# Patient Record
Sex: Male | Born: 1960 | Race: Black or African American | Hispanic: No | Marital: Married | Smoking: Former smoker
Health system: Southern US, Community
[De-identification: ages and names within clinical notes are randomized; demographics above are authoritative.]

## PROBLEM LIST (undated history)

## (undated) DIAGNOSIS — Z8679 Personal history of other diseases of the circulatory system: Secondary | ICD-10-CM

## (undated) DIAGNOSIS — Z9289 Personal history of other medical treatment: Secondary | ICD-10-CM

## (undated) DIAGNOSIS — I5022 Chronic systolic (congestive) heart failure: Secondary | ICD-10-CM

## (undated) DIAGNOSIS — I2510 Atherosclerotic heart disease of native coronary artery without angina pectoris: Secondary | ICD-10-CM

## (undated) DIAGNOSIS — I509 Heart failure, unspecified: Secondary | ICD-10-CM

## (undated) DIAGNOSIS — E785 Hyperlipidemia, unspecified: Secondary | ICD-10-CM

## (undated) DIAGNOSIS — I428 Other cardiomyopathies: Secondary | ICD-10-CM

## (undated) HISTORY — DX: Hyperlipidemia, unspecified: E78.5

## (undated) HISTORY — DX: Chronic systolic (congestive) heart failure: I50.22

## (undated) HISTORY — DX: Atherosclerotic heart disease of native coronary artery without angina pectoris: I25.10

## (undated) HISTORY — DX: Personal history of other medical treatment: Z92.89

## (undated) HISTORY — DX: Personal history of other diseases of the circulatory system: Z86.79

## (undated) HISTORY — DX: Other cardiomyopathies: I42.8

## (undated) HISTORY — PX: CARDIAC CATHETERIZATION: SHX172

---

## 1999-08-14 ENCOUNTER — Emergency Department (HOSPITAL_COMMUNITY): Admission: EM | Admit: 1999-08-14 | Discharge: 1999-08-14 | Payer: Self-pay | Admitting: Emergency Medicine

## 1999-08-14 ENCOUNTER — Encounter: Payer: Self-pay | Admitting: Emergency Medicine

## 2006-10-11 ENCOUNTER — Ambulatory Visit: Payer: Self-pay | Admitting: Internal Medicine

## 2007-02-07 ENCOUNTER — Ambulatory Visit: Payer: Self-pay | Admitting: Internal Medicine

## 2009-11-16 ENCOUNTER — Observation Stay (HOSPITAL_COMMUNITY)
Admission: EM | Admit: 2009-11-16 | Discharge: 2009-11-19 | Payer: Self-pay | Source: Home / Self Care | Admitting: Emergency Medicine

## 2009-11-16 ENCOUNTER — Ambulatory Visit: Payer: Self-pay | Admitting: Cardiology

## 2009-11-17 ENCOUNTER — Encounter (INDEPENDENT_AMBULATORY_CARE_PROVIDER_SITE_OTHER): Payer: Self-pay | Admitting: Internal Medicine

## 2009-11-17 ENCOUNTER — Encounter: Payer: Self-pay | Admitting: Infectious Diseases

## 2009-11-26 ENCOUNTER — Ambulatory Visit: Payer: Self-pay | Admitting: Cardiology

## 2009-11-26 DIAGNOSIS — F17200 Nicotine dependence, unspecified, uncomplicated: Secondary | ICD-10-CM | POA: Insufficient documentation

## 2009-11-26 DIAGNOSIS — I5022 Chronic systolic (congestive) heart failure: Secondary | ICD-10-CM

## 2009-11-26 DIAGNOSIS — I2510 Atherosclerotic heart disease of native coronary artery without angina pectoris: Secondary | ICD-10-CM

## 2009-12-03 ENCOUNTER — Encounter (INDEPENDENT_AMBULATORY_CARE_PROVIDER_SITE_OTHER): Payer: Self-pay | Admitting: *Deleted

## 2009-12-08 ENCOUNTER — Telehealth: Payer: Self-pay | Admitting: Cardiology

## 2009-12-16 ENCOUNTER — Ambulatory Visit: Payer: Self-pay | Admitting: Cardiology

## 2009-12-16 ENCOUNTER — Ambulatory Visit (HOSPITAL_COMMUNITY): Admission: RE | Admit: 2009-12-16 | Discharge: 2009-12-16 | Payer: Self-pay | Admitting: Cardiology

## 2009-12-31 ENCOUNTER — Ambulatory Visit: Payer: Self-pay | Admitting: Cardiology

## 2010-01-02 LAB — CONVERTED CEMR LAB
BUN: 15 mg/dL (ref 6–23)
CO2: 29 meq/L (ref 19–32)
Calcium: 9.6 mg/dL (ref 8.4–10.5)
Chloride: 100 meq/L (ref 96–112)
Creatinine, Ser: 0.9 mg/dL (ref 0.4–1.5)
GFR calc non Af Amer: 115.5 mL/min (ref >60)
Glucose, Bld: 93 mg/dL (ref 70–99)
Potassium: 4.2 meq/L (ref 3.5–5.1)
Pro B Natriuretic peptide (BNP): 212 pg/mL — ABNORMAL HIGH (ref 0.0–100.0)
Sodium: 137 meq/L (ref 135–145)

## 2010-01-05 ENCOUNTER — Ambulatory Visit: Payer: Self-pay | Admitting: Cardiology

## 2010-01-14 LAB — CONVERTED CEMR LAB
BUN: 14 mg/dL (ref 6–23)
CO2: 27 meq/L (ref 19–32)
Calcium: 9.7 mg/dL (ref 8.4–10.5)
Chloride: 104 meq/L (ref 96–112)
Creatinine, Ser: 1 mg/dL (ref 0.4–1.5)
GFR calc non Af Amer: 102.27 mL/min (ref >60)
Glucose, Bld: 103 mg/dL — ABNORMAL HIGH (ref 70–99)
Potassium: 4.3 meq/L (ref 3.5–5.1)
Sodium: 139 meq/L (ref 135–145)

## 2010-03-16 ENCOUNTER — Ambulatory Visit: Payer: Self-pay | Admitting: Cardiology

## 2010-03-17 LAB — CONVERTED CEMR LAB
BUN: 21 mg/dL (ref 6–23)
CO2: 29 meq/L (ref 19–32)
Calcium: 9.6 mg/dL (ref 8.4–10.5)
Chloride: 100 meq/L (ref 96–112)
Creatinine, Ser: 0.9 mg/dL (ref 0.4–1.5)
GFR calc non Af Amer: 111.11 mL/min (ref >60)
Glucose, Bld: 94 mg/dL (ref 70–99)
Potassium: 4.1 meq/L (ref 3.5–5.1)
Pro B Natriuretic peptide (BNP): 81.1 pg/mL (ref 0.0–100.0)
Sodium: 137 meq/L (ref 135–145)

## 2010-05-14 ENCOUNTER — Telehealth (INDEPENDENT_AMBULATORY_CARE_PROVIDER_SITE_OTHER): Payer: Self-pay | Admitting: *Deleted

## 2010-05-14 ENCOUNTER — Telehealth: Payer: Self-pay | Admitting: Cardiology

## 2010-05-14 ENCOUNTER — Encounter: Payer: Self-pay | Admitting: Cardiology

## 2010-05-28 ENCOUNTER — Ambulatory Visit: Payer: Self-pay | Admitting: Internal Medicine

## 2010-05-28 ENCOUNTER — Ambulatory Visit (HOSPITAL_COMMUNITY): Admission: RE | Admit: 2010-05-28 | Discharge: 2010-05-28 | Payer: Self-pay | Admitting: Cardiology

## 2010-05-28 ENCOUNTER — Encounter: Payer: Self-pay | Admitting: Cardiology

## 2010-05-28 ENCOUNTER — Ambulatory Visit: Payer: Self-pay

## 2010-06-08 ENCOUNTER — Ambulatory Visit: Payer: Self-pay | Admitting: Cardiology

## 2010-06-22 ENCOUNTER — Ambulatory Visit: Payer: Self-pay | Admitting: Cardiology

## 2010-06-26 ENCOUNTER — Telehealth: Payer: Self-pay | Admitting: Cardiology

## 2010-06-29 LAB — CONVERTED CEMR LAB
Albumin: 3.9 g/dL (ref 3.5–5.2)
Alkaline Phosphatase: 56 units/L (ref 39–117)
CO2: 29 meq/L (ref 19–32)
Chloride: 104 meq/L (ref 96–112)
Creatinine, Ser: 0.8 mg/dL (ref 0.4–1.5)
Glucose, Bld: 86 mg/dL (ref 70–99)
HDL: 67.9 mg/dL (ref >39.00)
LDL Cholesterol: 98 mg/dL (ref 0–99)
Sodium: 139 meq/L (ref 135–145)
Total Bilirubin: 0.7 mg/dL (ref 0.3–1.2)
Total CHOL/HDL Ratio: 3
Triglycerides: 115 mg/dL (ref 0.0–149.0)

## 2010-07-20 ENCOUNTER — Telehealth: Payer: Self-pay | Admitting: Cardiology

## 2010-08-26 ENCOUNTER — Ambulatory Visit: Payer: Self-pay

## 2010-09-27 ENCOUNTER — Encounter: Payer: Self-pay | Admitting: Cardiology

## 2010-09-30 ENCOUNTER — Other Ambulatory Visit: Payer: Self-pay | Admitting: Cardiology

## 2010-09-30 ENCOUNTER — Encounter: Payer: Self-pay | Admitting: Cardiology

## 2010-09-30 ENCOUNTER — Ambulatory Visit
Admission: RE | Admit: 2010-09-30 | Discharge: 2010-09-30 | Payer: Self-pay | Source: Home / Self Care | Attending: Cardiology | Admitting: Cardiology

## 2010-10-01 LAB — BASIC METABOLIC PANEL
BUN: 12 mg/dL (ref 6–23)
CO2: 27 mEq/L (ref 19–32)
Calcium: 9 mg/dL (ref 8.4–10.5)
Chloride: 101 mEq/L (ref 96–112)
Creatinine, Ser: 0.9 mg/dL (ref 0.4–1.5)
GFR: 122.99 mL/min (ref >60.00)
Glucose, Bld: 91 mg/dL (ref 70–99)
Potassium: 3.8 mEq/L (ref 3.5–5.1)
Sodium: 137 mEq/L (ref 135–145)

## 2010-10-01 LAB — BRAIN NATRIURETIC PEPTIDE: Pro B Natriuretic peptide (BNP): 69.9 pg/mL (ref 0.0–100.0)

## 2010-10-06 NOTE — Progress Notes (Signed)
   Walk in Patient Form Recieved " Pt needs med called in" sent to [facility]  May 14, 2010 2:47 PM

## 2010-10-06 NOTE — Assessment & Plan Note (Signed)
Summary: per check out/sf  Medications Added COREG 6.25 MG TABS (CARVEDILOL) one and one-half  tablets  two times a day for 1 week then two tablets twice a day CHANTIX STARTING MONTH PAK 0.5 MG X 11 & 1 MG X 42 TABS (VARENICLINE TARTRATE) take as directed-pt not using CHANTIX CONTINUING MONTH PAK 1 MG TABS (VARENICLINE TARTRATE) take as directed-pt not using      Allergies Added:   Primary Provider:  Dr. Wynelle Link  CC:  follow up.  Follow up on MR and Labs. Pt states he has  been feeling good.  Marland Kitchen  History of Present Illness: 50 yo with minimal past history presents returns for evaluation of newly-diagnosed cardiomyopathy.  Patient presented to [facility] in 3/11 after 2-3 weeks of shortness of breath.  He had a flu-like illness with congestion and myalgias and had been treated with clarithromycin as an outpatient.  However, he became progressively short of breath so went into the hospital.  He was noted to have BNP 976 and pulmonary edema on CXR.  Echo showed EF 20-25%.  Left heart cath was done showing mild coronary disease, suggesting that this was likely a nonischemic cardiomyopathy.  Patient was diuresed and begun on cardiac meds.  Cardiac MRI showed EF 20% without significant delayed enhancement, so no evidence for infiltrative disease.    Since discharge he has been doing well.  No more dyspnea.  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  He does a lot of walking on the job and is not having any problems.  He is able to push mow his lawn without difficulty.  He can walk up a hill without problem.  Weight has been stable.  No chest pain, no orthopnea/PND, no lightheadedness.  He does not know his family history (adopted).  He uses ETOH moderately heavily (3-4 drinks 3 times a week).  He does not use drugs.  HIV was negative.    ECG: NSR, PVCs, LVH with repolarization abnormality  Labs (3/11): UPEP/SPEP negative, ANA negative, urine drug screen negative, HIV negative, TSH normal, LDL  158, HDL 106, TGs 200, BNP 976, ESR 3, K 4, creatinine 1.01 Labs (4/11): BNP 212, K 4.2, creatinine 0.9  Current Medications (verified): 1)  Aspirin 81 Mg Tbec (Aspirin) .... Take One Tablet By Mouth Daily 2)  Lipitor 20 Mg Tabs (Atorvastatin Calcium) .... Take One Tablet Once Daily 3)  Coreg 6.25 Mg Tabs (Carvedilol) .... One  Tablet Two Times A Day 4)  Enalapril Maleate 5 Mg Tabs (Enalapril Maleate) .... One and One-Half  Tablet Twice A Day 5)  Klor-Con M20 20 Meq Cr-Tabs (Potassium Chloride Crys Cr) .... Take One Tablet Once Daily 6)  Spironolactone 25 Mg Tabs (Spironolactone) .... One Tablet Daily 7)  Furosemide 20 Mg Tabs (Furosemide) .... Take One Tablet Daily 8)  Chantix Starting Month Pak 0.5 Mg X 11 & 1 Mg X 42 Tabs (Varenicline Tartrate) .... Take As Directed-Pt Not Using 9)  Chantix Continuing Month Pak 1 Mg Tabs (Varenicline Tartrate) .... Take As Directed-Pt Not Using  Allergies (verified): 1)  ! Pcn  Past History:  Past Medical History: 1. Nonischemic Cardiomyopathy: LHC (3/11) showed EF 15%, 40% ostial CFX, 40% prox RCA, 40-50% mid RCA.  Echo (3/11): Moderately dilated LV, EF 20-25%, global HK, severe diastolic dysfunction, mild RV dilation and dysfunction, moderate MR. SPEP/UPEP negative, ANA negative, HIV negative, TSH normal, no drug history.  Moderate ETOH use.  Highest suspicion probably viral myocarditis.  Cardiac MRI (4/11) showed a moderately dilated LV with global systolic dysfunction, EF 20%, normal RV size with moderate systolic dysfunction, small area of nonspecific subepicardial basal inferoseptal (RV insertion site) delayed enhancement.  2. Nonobstructive CAD: LHC as above with mild disease.  3. Smoking 4. Hyperlipidemia  Family History: Reviewed history from 11/26/2009 and no changes required. The patient is adopted so does not know his family history.   Social History: Reviewed history from 11/26/2009 and no changes required. 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  smoked half pack per day for over 30 years, now down to 4-5 cigs/day Alcohol Use - yes, usually liquor three times a week (3-4 drinks at a time) Married , has two children.  One is 14 and one is 20.      Review of Systems       All systems reviewed and negative except as per HPI.   Vital Signs:  Patient profile:   50 year old male Height:      68 inches Weight:      147 pounds BMI:     22.43 Pulse rate:   78 / minute Pulse rhythm:   regular BP sitting:   122 / 84  (left arm) Cuff size:   regular  Vitals Entered By: Judithe Modest CMA (Jan 05, 2010 9:19 AM)  Physical Exam  General:  Well developed, well nourished, in no acute distress. Neck:  Neck supple, no JVD. No masses, thyromegaly or abnormal cervical nodes. Lungs:  Clear bilaterally to auscultation and percussion. Heart:  Non-displaced PMI, chest non-tender; regular rate and rhythm, S1, S2 without murmurs, rubs or gallops. Carotid upstroke normal, no bruit.  Pedals normal pulses. No edema, no varicosities. Abdomen:  Bowel sounds positive; abdomen soft and non-tender without masses, organomegaly, or hernias noted. No hepatosplenomegaly. Extremities:  No clubbing or cyanosis. Neurologic:  Alert and oriented x 3. Psych:  Normal affect.   Impression & Recommendations:  Problem # 1:  CHRONIC SYSTOLIC HEART FAILURE (ICD-428.22) Nonischemic cardiomyopathy of uncertain etiology.  Probably due to viral myocarditis versus possibly ETOH.  Cannot rule out familial cardiomyopathy as patient was adopted and does not know family history.  Cardiac MRI did not show significant delayed enhancement, so no evidence for infiltrative disease.  Patient appears euvolemic on exam and has minimal exertional symptoms.  Weight is stable.  Increase Coreg gradually to 12.5 mg two times a day.  Continue current dose of enalapril and spironolactone.  After enalapril and spironolactone are titrated, will start Bidil.   Echo in 9/11 to followup LV function on therapy.  If still < 35%, will need ICD.  BMET today as enalapril recently increased.   Problem # 2:  HYPERLIPIDEMIA-MIXED (ICD-272.4) Continue statin for goal LDL < 70 given coronary disease.  Lipids/LFTs at next appointment in 2 months.   Problem # 3:  CAD (ICD-414.00) Mild coronary disease on cath (maximal 40-50% stenosis in RCA).  Continue ASA 81 mg daily.  Needs to continue statin for LDL lowering.  Needs to quit smoking.   Problem # 4:  SMOKER (ICD-305.1) Patient is trying to cut back on cigarettes.  I counselled him to quit.  He has a Chantix prescription but has not filled it.   Other Orders: TLB-BMP (Basic Metabolic Panel-BMET) (80048-METABOL) Echocardiogram (Echo)  Patient Instructions: 1)  Your physician has recommended you make the following change in your medication:  2)  Increase Coreg to 9.375mg (this will be one and one-half  of a 6.25mg  tablet ) twice a day for one week then increase to 12.5mg (this will be two 6.25mg  tablets)  twice a day 3)  Your physician recommends that you have  lab work today--BMP 428.22 4)  Your physician recommends that you schedule a follow-up appointment in: 2 months with Dr Shirlee Latch. 5)  Your physician has requested that you have an echocardiogram.  Echocardiography is a painless test that uses sound waves to create images of your heart. It provides your doctor with information about the size and shape of your heart and how well your heart's chambers and valves are working.  This procedure takes approximately one hour. There are no restrictions for this procedure. SEPTEMBER 2011 428.22

## 2010-10-06 NOTE — Assessment & Plan Note (Signed)
Summary: per check out/sf    Visit Type:  Follow-up Primary Provider:  Dr. Wynelle Link   History of Present Illness: 50 yo with minimal past history presents returns for evaluation of recently-diagnosed cardiomyopathy.  Patient presented to [facility] in 3/11 after 2-3 weeks of shortness of breath.  He had a flu-like illness with congestion and myalgias and had been treated with clarithromycin as an outpatient.  However, he became progressively short of breath so went into the hospital.  He was noted to have BNP 976 and pulmonary edema on CXR.  Echo showed EF 20-25%.  Left heart cath was done showing mild coronary disease, suggesting that this was likely a nonischemic cardiomyopathy.  Patient was diuresed and begun on cardiac meds.  Cardiac MRI showed EF 20% without significant delayed enhancement, so no evidence for infiltrative disease.    Since discharge he has been doing well.  No more dyspnea.  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  He does a lot of walking on the job and is not having any problems.  He is able to push mow his lawn without difficulty.  He can walk up a hill without problem.  Weight has been stable.  No chest pain, no orthopnea/PND, no lightheadedness.  He is now smoking 3-4 cigarettes per day.  He developed headaches after starting Bidil that seem to be lessening gradually over time.    Echo was repeated 9/11, showing some improvement.  Would estimate EF at 40% with diffuse hypokinesis.   Labs (3/11): UPEP/SPEP negative, ANA negative, urine drug screen negative, HIV negative, TSH normal, LDL 158, HDL 106, TGs 200, BNP 976, ESR 3, K 4, creatinine 1.01 Labs (4/11): BNP 212, K 4.2, creatinine 0.97 Labs (7/11): BNP 81, K 4.1, creatinine 0.9  ECG: NSR, LVH with repolarization abnormality.   Current Medications (verified): 1)  Aspirin 81 Mg Tbec (Aspirin) .... Take One Tablet By Mouth Daily 2)  Lipitor 20 Mg Tabs (Atorvastatin Calcium) .... Take One Tablet Once Daily 3)   Coreg 12.5 Mg Tabs (Carvedilol) .... One Twice A Day 4)  Enalapril Maleate 10 Mg Tabs (Enalapril Maleate) .... One Twice A Day 5)  Klor-Con M20 20 Meq Cr-Tabs (Potassium Chloride Crys Cr) .... Take One Tablet Once Daily 6)  Spironolactone 25 Mg Tabs (Spironolactone) .... One Tablet Daily 7)  Furosemide 20 Mg Tabs (Furosemide) .... Take One Tablet Daily 8)  Bidil 20-37.5 Mg Tabs (Isosorb Dinitrate-Hydralazine) .... One Three Times A Day  Allergies: 1)  ! Pcn  Past History:  Past Medical History: 1. Nonischemic Cardiomyopathy: LHC (3/11) showed EF 15%, 40% ostial CFX, 40% prox RCA, 40-50% mid RCA.  Echo (3/11): Moderately dilated LV, EF 20-25%, global HK, severe diastolic dysfunction, mild RV dilation and dysfunction, moderate MR. SPEP/UPEP negative, ANA negative, HIV negative, TSH normal, no drug history.  Moderate ETOH use.  Highest suspicion probably viral myocarditis.   Cardiac MRI (4/11) showed a moderately dilated LV with global systolic dysfunction, EF 20%, normal RV size with moderate systolic dysfunction, small area of nonspecific subepicardial basal inferoseptal (RV insertion site) delayed enhancement.  Repeat echo (9/11): EF 40% with mild to moderate diffuse hypokinesis.  2. Nonobstructive CAD: LHC as above with mild disease.  3. Smoking 4. Hyperlipidemia  Family History: Reviewed history from 11/26/2009 and no changes required. The patient is adopted so does not know his family history.   Social History: Reviewed history from 03/16/2010 and no changes required. patient works in Office manager at [employer]  [employer] Tobacco Use - Yes.  smoked half pack per day for over 30 years, now down to 3-4 cigs/day Alcohol Use - yes, usually liquor three times a week (3-4 drinks at a time) Married , has two children.  One is 14 and one is 20.      Review of Systems       All systems reviewed and negative except as per HPI.   Vital Signs:  Patient profile:   50 year old male Height:      68  inches Weight:      150.25 pounds BMI:     22.93 Pulse rate:   94 / minute Pulse rhythm:   regular Resp:     18 per minute BP sitting:   107 / 63  (left arm) Cuff size:   large  Vitals Entered By: Vikki Ports (June 08, 2010 4:06 PM)  Physical Exam  General:  Well developed, well nourished, in no acute distress. Neck:  Neck supple, no JVD. No masses, thyromegaly or abnormal cervical nodes. Lungs:  Clear bilaterally to auscultation and percussion. Heart:  Non-displaced PMI, chest non-tender; regular rate and rhythm, S1, S2 without murmurs, rubs or gallops. Carotid upstroke normal, no bruit.  Pedals normal pulses. No edema, no varicosities. Abdomen:  Bowel sounds positive; abdomen soft and non-tender without masses, organomegaly, or hernias noted. No hepatosplenomegaly. Extremities:  No clubbing or cyanosis. Neurologic:  Alert and oriented x 3. Psych:  Normal affect.   Impression & Recommendations:  Problem # 1:  CHRONIC SYSTOLIC HEART FAILURE (ICD-428.22) Nonischemic cardiomyopathy of uncertain etiology.  Probably due to viral myocarditis versus possibly ETOH.  Cannot rule out familial cardiomyopathy as patient was adopted and does not know family history.  Cardiac MRI did not show significant delayed enhancement, so no evidence for infiltrative disease.  Patient appears euvolemic on exam and has minimal exertional symptoms.  Weight is stable.  Echo shows improvement, EF 40%.  ICD not indicated at this time.  Continue current meds (enalapril, Coreg, spironolactone, and Bidil).  Will not uptitrate at this time as BP has been a bit soft.  OK to discontinue Lasix and KCl.  He appears euvolemic currently.  He will monitor his weight and restart Lasix if he gains more than 3 lbs in a week.  I also encouraged him to increase his exercise level.  Need to check BMP.  Will repeat echo in 6 months.   Problem # 2:  CAD (ICD-414.00) Mild coronary disease on cath (maximal 40-50% stenosis in RCA).   Continue ASA 81 mg daily.  Needs to continue statin for LDL lowering.  Needs to quit smoking completely.  Will repeat lipids/LFTs with goal LDL < 70.   Other Orders: Echocardiogram (Echo)  Patient Instructions: 1)  Stop Lasix(furosemide). 2)  Stop potassium(KCL). 3)  Schedule appointment with your primary care doctor for a pnuemonia and  flu vaccine. 4)  Your physician recommends that you return for a FASTING lipid profile/liver profile/BMP in the next week or two--428.22 5)  Your physician recommends that you schedule a follow-up appointment in: 3 months with Dr Shirlee Latch. 6)  Your physician has requested that you have an echocardiogram.  Echocardiography is a painless test that uses sound waves to create images of your heart. It provides your doctor with information about the size and shape of your heart and how well your heart's chambers and valves are working.  This procedure takes approximately one hour. There are no restrictions for this procedure.  IN 6 MONTHS

## 2010-10-06 NOTE — Progress Notes (Signed)
Summary: pt needs refills  Medications Added ASPIRIN 81 MG TBEC (ASPIRIN) Take one tablet by mouth daily LIPITOR 20 MG TABS (ATORVASTATIN CALCIUM) take one tablet once daily COREG 6.25 MG TABS (CARVEDILOL) one  tablet two times a day ENALAPRIL MALEATE 5 MG TABS (ENALAPRIL MALEATE) one tablet twice a day KLOR-CON M20 20 MEQ CR-TABS (POTASSIUM CHLORIDE CRYS CR) take one tablet once daily SPIRONOLACTONE 25 MG TABS (SPIRONOLACTONE) one tablet daily FUROSEMIDE 20 MG TABS (FUROSEMIDE) take one tablet daily CHANTIX STARTING MONTH PAK 0.5 MG X 11 & 1 MG X 42 TABS (VARENICLINE TARTRATE) take as directed CHANTIX CONTINUING MONTH PAK 1 MG TABS (VARENICLINE TARTRATE) take as directed       Phone Note Refill Request Call back at Home Phone [phone] Message from:  Patient on [facility]  Refills Requested: Medication #1:  LIPITOR 20 MG TABS take one tablet once daily  Medication #2:  COREG 6.25 MG TABS one  tablet two times a day  Medication #3:  ENALAPRIL MALEATE 5 MG TABS one tablet twice a day  Medication #4:  SPIRONOLACTONE 25 MG TABS one tablet daily pt is also completely out of Klor-Con and Furosemide 20mg  and needs a 10day sully [facility] [address] and and call into [facility].  Initial call taken by: Omer Jack,  December 08, 2009 10:58 AM  Follow-up for Phone Call       Follow-up by: Judithe Modest CMA,  December 08, 2009 11:36 AM    Prescriptions: FUROSEMIDE 20 MG TABS (FUROSEMIDE) take one tablet daily  #10 x 0   Entered by:   Judithe Modest CMA   Authorized by:   Marca Ancona, MD   Signed by:   Judithe Modest CMA on 12/08/2009   Method used:   [facility]  [address] [id number]       [address]       [place], [place]  [address]       Ph: [phone] or [phone]       Fax: [phone]   RxID:   [id number] KLOR-CON M20 20 MEQ CR-TABS (POTASSIUM CHLORIDE CRYS CR) take one tablet once daily  #10 x 0   Entered  by:   Judithe Modest CMA   Authorized by:   Marca Ancona, MD   Signed by:   Judithe Modest CMA on 12/08/2009   Method used:   [facility]        [facility]  [address] [id number]       [address]       [place], [place]  [address]       Ph: [phone] or [phone]       Fax: [phone]   RxID:   [id number] FUROSEMIDE 20 MG TABS (FUROSEMIDE) take one tablet daily  #90 x 3   Entered by:   Judithe Modest CMA   Authorized by:   Marca Ancona, MD   Signed by:   Judithe Modest CMA on 12/08/2009   Method used:   Electronically to        [facility]* (mail-order)             ,          Ph: [phone]       Fax: [phone]   RxID:   [id number] SPIRONOLACTONE 25 MG TABS (SPIRONOLACTONE) one tablet  daily  #90 x 3   Entered by:   Judithe Modest CMA   Authorized by:   Marca Ancona, MD   Signed by:   Judithe Modest CMA on 12/08/2009   Method used:   Electronically to        [facility]* (mail-order)             ,          Ph: [phone]       Fax: [phone]   RxID:   [id number] KLOR-CON M20 20 MEQ CR-TABS (POTASSIUM CHLORIDE CRYS CR) take one tablet once daily  #90 x 3   Entered by:   Judithe Modest CMA   Authorized by:   Marca Ancona, MD   Signed by:   Judithe Modest CMA on 12/08/2009   Method used:   Electronically to        [facility]* (mail-order)             ,          Ph: [phone]       Fax: [phone]   RxID:   [id number] ENALAPRIL MALEATE 5 MG TABS (ENALAPRIL MALEATE) one tablet twice a day  #180 x 3   Entered by:   Judithe Modest CMA   Authorized by:   Marca Ancona, MD   Signed by:   Judithe Modest CMA on 12/08/2009   Method used:   Electronically to        [facility]* (mail-order)             ,          Ph: [phone]       Fax: [phone]   RxID:   [id number] COREG 6.25 MG TABS (CARVEDILOL) one  tablet two times a day  #180 x 3   Entered by:   Judithe Modest CMA   Authorized  by:   Marca Ancona, MD   Signed by:   Judithe Modest CMA on 12/08/2009   Method used:   Electronically to        [facility]* (mail-order)             ,          Ph: [phone]       Fax: [phone]   RxID:   [id number] LIPITOR 20 MG TABS (ATORVASTATIN CALCIUM) take one tablet once daily  #90 x 3   Entered by:   Judithe Modest CMA   Authorized by:   Marca Ancona, MD   Signed by:   Judithe Modest CMA on 12/08/2009   Method used:   Electronically to        [facility]* (mail-order)             ,          Ph: [phone]       Fax: [phone]   RxID:   [id number]

## 2010-10-06 NOTE — Assessment & Plan Note (Signed)
Summary: eph/jml  Medications Added ASPIRIN 81 MG TBEC (ASPIRIN) Take one tablet by mouth daily LIPITOR 20 MG TABS (ATORVASTATIN CALCIUM) take one tablet once daily COREG 6.25 MG TABS (CARVEDILOL) one  tablet two times a day ENALAPRIL MALEATE 5 MG TABS (ENALAPRIL MALEATE) one tablet twice a day KLOR-CON M20 20 MEQ CR-TABS (POTASSIUM CHLORIDE CRYS CR) take one tablet once daily SPIRONOLACTONE 25 MG TABS (SPIRONOLACTONE) one tablet daily FUROSEMIDE 20 MG TABS (FUROSEMIDE) take one tablet daily CHANTIX STARTING MONTH PAK 0.5 MG X 11 & 1 MG X 42 TABS (VARENICLINE TARTRATE) take as directed CHANTIX CONTINUING MONTH PAK 1 MG TABS (VARENICLINE TARTRATE) take as directed      Allergies Added:   CC:  eph/  .  History of Present Illness: 50 yo with minimal past history presents for evaluation of newly-diagnosed cardiomyopathy.  Patient presented to [facility] in 3/11 after 2-3 weeks of shortness of breath.  He had a flu-like illness with congestion and myalgias and had been treated with clarithromycin as an outpatient.  However, he became progressively short of breath so went into the hospital.  He was noted to have BNP 976 and pulmonary edema on CXR.  Echo showed EF 20-25%.  Left heart cath was done showing mild coronary disease, suggesting that this was likely a nonischemic cardiomyopathy.  Patient was diuresed and begun on cardiac meds.  Since discharge he has been doing well.  No more dyspnea.  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  He does a lot of walking on the job and is not having any problems.  No chest pain, no orthopnea/PND, no lightheadedness.  He does not know his family history (adopted).  He uses ETOH moderately heavily (3-4 drinks 3 times a week).  He does not use drugs.  HIV was negative.    ECG: NSR, PVCs, LVH with repolarization abnormality  Labs (3/11): UPEP/SPEP negative, ANA negative, urine drug screen negative, HIV negative, TSH normal, LDL 158, HDL 106, TGs 200,  BNP 976, ESR 3, K 4, creatinine 1.01  Current Medications (verified): 1)  Aspirin 325 Mg Tabs (Aspirin) .... Take One Tablet Once Daily 2)  Lipitor 20 Mg Tabs (Atorvastatin Calcium) .... Take One Tablet Once Daily 3)  Coreg 6.25 Mg Tabs (Carvedilol) .... 1/2 Tablet Two Times A Day 4)  Enalapril Maleate 5 Mg Tabs (Enalapril Maleate) .... Take One Tablet Once Daily 5)  Klor-Con M20 20 Meq Cr-Tabs (Potassium Chloride Crys Cr) .... Take One Tablet Once Daily 6)  Spironolactone 25 Mg Tabs (Spironolactone) .... Take 1/2 Tablet Daily 7)  Furosemide 20 Mg Tabs (Furosemide) .... Take One Tablet Daily  Allergies (verified): 1)  ! Pcn  Past History:  Past Medical History: 1. Nonischemic Cardiomyopathy: LHC (3/11) showed EF 15%, 40% ostial CFX, 40% prox RCA, 40-50% mid RCA.  Echo (3/11): Moderately dilated LV, EF 20-25%, global HK, severe diastolic dysfunction, mild RV dilation and dysfunction, moderate MR. SPEP/UPEP negative, ANA negative, HIV negative, TSH normal, no drug history.  Moderate ETOH use.  Highest suspicion probably viral myocarditis.  2. Nonobstructive CAD: LHC as above with mild disease.  3. Smoking 4. Hyperlipidemia  Family History: The patient is adopted so does not know his family history.   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  smoked half pack per day for over 30 years, now down to 4-5 cigs/day Alcohol Use - yes, usually liquor three times a week (3-4 drinks at a  time) Married , his two children.  One is 14 and one is 20.      Review of Systems       All systems reviewed and negative except as per HPI.   Vital Signs:  Patient profile:   50 year old male Height:      68 inches Weight:      147 pounds BMI:     22.43 Pulse rate:   80 / minute Pulse rhythm:   regular BP sitting:   106 / 78  (left arm) Cuff size:   regular  Vitals Entered By: Judithe Modest CMA (November 26, 2009 10:13 AM)  Physical Exam  General:  Well  developed, well nourished, in no acute distress. Head:  normocephalic and atraumatic Nose:  no deformity, discharge, inflammation, or lesions Mouth:  Teeth, gums and palate normal. Oral mucosa normal. Neck:  Neck supple, no JVD. No masses, thyromegaly or abnormal cervical nodes. Lungs:  Clear bilaterally to auscultation and percussion. Heart:  Non-displaced PMI, chest non-tender; regular rate and rhythm, S1, S2 without murmurs, rubs or gallops. Carotid upstroke normal, no bruit.  Pedals normal pulses. No edema, no varicosities. Abdomen:  Bowel sounds positive; abdomen soft and non-tender without masses, organomegaly, or hernias noted. No hepatosplenomegaly. Msk:  Back normal, normal gait. Muscle strength and tone normal. Extremities:  No clubbing or cyanosis. Neurologic:  Alert and oriented x 3. Skin:  Intact without lesions or rashes. Psych:  Normal affect.   Impression & Recommendations:  Problem # 1:  CHRONIC SYSTOLIC HEART FAILURE (ICD-428.22) Nonischemic cardiomyopathy of uncertain etiology.  Probably due to viral myocarditis versus possibly ETOH.  Cannot rule out familial cardiomyopathy as patient was adopted and does not know family history.  Patient appears euvolemic on exam and has minimal exertional symptoms.   I would like to get a cardiac MRI to look for evidence of myocarditis.  I will continue him on enalapril at 5 mg two times a day and lasix 20 mg daily. Increase Coreg to 6.25 mg two times a day.  Increase spironolactone to 25 mg daily.  We talked about cutting back on ETOH intake.  Patient had a number of questions regarding his diagnosis which we discussed today.   Problem # 2:  CAD (ICD-414.00) Mild coronary disease on cath (maximal 40-50% stenosis in RCA).  OK to decrease ASA to 81 mg daily.  Needs to continue statin for LDL lowering.  Needs to quit smoking.   Problem # 3:  SMOKER (ICD-305.1) Patient is trying to cut back on cigarettes.  I counselled him to quit and gave  him a Chantix prescription.   Problem # 4:  HYPERLIPIDEMIA-MIXED (ICD-272.4) Assessment: Deteriorated Continue statin for goal LDL < 70 given coronary disease.  Lipids/LFTs in 2 months.   Other Orders: EKG w/ Interpretation (93000) Cardiac MRI (Cardiac MRI)  Patient Instructions: 1)  Your physician has recommended you make the following change in your medication:  2)  Decrease Aspirin to 81mg  daily--this should be buffered or coated--you can get this without a prescription 3)  Increase Coreg(carvedilol) to 6.25mg  twice a day 4)  Increase Spironolactone to 25mg  daily 5)  Start Chantix to help you stop smoking 6)  Your physician has requested that you have a cardiac MRI.  Cardiac MRI uses a computer to create images of your heart as it's beating, producing both still and moving pictures of your heart and major blood vessels. For further information please visit  [email or web address]  Please follow the  instruction sheet given to you today for more information. 7)  Your physician recommends that you schedule a follow-up appointment in: 1 month with Dr Shirlee Latch 8)  Have lab done a few days before the appointment with Dr Shirlee Latch in 1 month--BMP/BNP 428.22  414.01 Prescriptions: FUROSEMIDE 20 MG TABS (FUROSEMIDE) take one tablet daily  #90 x 3   Entered by:   Katina Dung, RN, BSN   Authorized by:   Marca Ancona, MD   Signed by:   Katina Dung, RN, BSN on 11/26/2009   Method used:   Print then Give to Patient   RxID:   [id number] SPIRONOLACTONE 25 MG TABS (SPIRONOLACTONE) one tablet daily  #90 x 3   Entered by:   Katina Dung, RN, BSN   Authorized by:   Marca Ancona, MD   Signed by:   Katina Dung, RN, BSN on 11/26/2009   Method used:   Print then Give to Patient   RxID:   [id number] KLOR-CON M20 20 MEQ CR-TABS (POTASSIUM CHLORIDE CRYS CR) take one tablet once daily  #90 x 3   Entered by:   Katina Dung, RN, BSN   Authorized by:   Marca Ancona, MD   Signed by:   Katina Dung, RN, BSN on 11/26/2009   Method used:   Print then Give to Patient   RxID:   [id number] ENALAPRIL MALEATE 5 MG TABS (ENALAPRIL MALEATE) one tablet twice a day  #180 x 3   Entered by:   Katina Dung, RN, BSN   Authorized by:   Marca Ancona, MD   Signed by:   Katina Dung, RN, BSN on 11/26/2009   Method used:   Print then Give to Patient   RxID:   [id number] COREG 6.25 MG TABS (CARVEDILOL) one  tablet two times a day  #180 x 3   Entered by:   Katina Dung, RN, BSN   Authorized by:   Marca Ancona, MD   Signed by:   Katina Dung, RN, BSN on 11/26/2009   Method used:   Print then Give to Patient   RxID:   [id number] LIPITOR 20 MG TABS (ATORVASTATIN CALCIUM) take one tablet once daily  #90 x 3   Entered by:   Katina Dung, RN, BSN   Authorized by:   Marca Ancona, MD   Signed by:   Katina Dung, RN, BSN on 11/26/2009   Method used:   Print then Give to Patient   RxID:   [id number] CHANTIX CONTINUING MONTH PAK 1 MG TABS (VARENICLINE TARTRATE) take as directed  #1 x 1   Entered by:   Katina Dung, RN, BSN   Authorized by:   Marca Ancona, MD   Signed by:   Katina Dung, RN, BSN on 11/26/2009   Method used:   Electronically to        [facility]  [address] [id number] (retail)       [address]       [place], [place]  [address]       Ph: [phone] or [phone]       Fax: [phone]   RxID:   [id number] CHANTIX STARTING MONTH PAK 0.5 MG X 11 & 1 MG X 42 TABS (VARENICLINE TARTRATE) take as directed  #1 x 0   Entered by:   Katina Dung, RN, BSN   Authorized by:   Marca Ancona, MD   Signed by:   Katina Dung, RN, BSN on 11/26/2009  Method used:   Electronically to        [facility]  [address] [id number] (ret[facility])       [address]       [place], [place]  [address]       Ph: [phone] or [phone]       Fax: [phone]   RxID:   [id number] COREG 6.25 MG TABS (CARVEDILOL) one  tablet two times a  day  #60 x 6   Entered by:   Katina Dung, RN, BSN   Authorized by:   Marca Ancona, MD   Signed by:   Katina Dung, RN, BSN on 11/26/2009   Method used:   Electronically to        [facility]  [address] [id number] ([facility]       [address]       [place], [place]  [address]       Ph: [phone] or [phone]       Fax: [phone]   RxID:   [id number] SPIRONOLACTONE 25 MG TABS (SPIRONOLACTONE) one tablet daily  #30 x 6   Entered by:   Katina Dung, RN, BSN   Authorized by:   Marca Ancona, MD   Signed by:   Katina Dung, RN, BSN on 11/26/2009   Method used:   [facility]ronically to        [facility]  [address] [id number]       [address]       [place], [place]  [address]       Ph: [phone] or [phone]       Fax: [phone]   RxID:   [id number]

## 2010-10-06 NOTE — Letter (Signed)
Summary: Appointment - Cardiac MRI  [facility]     [place], [place]    Phone:   Fax:       December 03, 2009 MRN: [id number]   [name] [address] [place], [place]  [address]   Dear Mr. MCCLURE,   We have scheduled the above patient for an appointment for a Cardiac MRI on 12-16-2009 at 9:00a.m.  Please refer to the below information for the location and instructions for this test:  Location:     [facility]       [address]       [place], [place]  [address] Instructions:    Wilmon Arms at [facility] Outpatient Registration 45 minutes prior to your appointment time.  This will ensure you are in the Radiology Department 30 minutes prior to your appointment.    There are no restrictions for this test you may eat and take medications as usual.  If you need to reschedule this appointment please call at the number listed above.   Sincerely,      Lorne Skeens [facility] Scheduling Team

## 2010-10-06 NOTE — Progress Notes (Signed)
Summary: pt has questions  Phone Note Call from Patient   Caller: Patient [phone] Reason for Call: Talk to Nurse Summary of Call: pt calling re his needs new rx for lipitor 40 mg to [facility]-pt has cold what can he take otc  Initial call taken by: Glynda Jaeger,  July 20, 2010 10:26 AM  Follow-up for Phone Call        talked with pt-- Rx sent to [facility] for Lipitor --suggested pt try coricidin and saline nasal spray for cold --    New/Updated Medications: LIPITOR 40 MG TABS (ATORVASTATIN CALCIUM) one daily Prescriptions: LIPITOR 40 MG TABS (ATORVASTATIN CALCIUM) one daily  #90 x 3   Entered by:   Katina Dung, RN, BSN   Authorized by:   Marca Ancona, MD   Signed by:   Katina Dung, RN, BSN on 07/20/2010   Method used:   Faxed to ...       [facility] (mail-order)             , [place]         Ph: [phone]       Fax: [phone]   RxID:   [id number]

## 2010-10-06 NOTE — Progress Notes (Signed)
Summary: pt rtn call  Medications Added BIDIL 20-37.5 MG TABS (ISOSORB DINITRATE-HYDRALAZINE) one three times a day       Phone Note Call from Patient Call back at Work Phone [phone]   Caller: Patient Reason for Call: Talk to Nurse, Talk to Doctor Summary of Call: pt on his way back from [place] and will answer if he can other wise leave a message and he will call as soon as he can Initial call taken by: Omer Jack,  May 14, 2010 4:06 PM  Follow-up for Phone Call        131/84  HR 80-pt had B/P and heart rate checked today at his employer--will review with Dr Shirlee Latch to be sure he wants to start BiDil   Katina Dung, RN, BSN  May 15, 2010 1:24 PM --reviewed with Dr Pierce Crane BiDil 20/37.5 three times a day --pt aware    New/Updated Medications: BIDIL 20-37.5 MG TABS (ISOSORB DINITRATE-HYDRALAZINE) one three times a day Prescriptions: BIDIL 20-37.5 MG TABS (ISOSORB DINITRATE-HYDRALAZINE) one three times a day  #270 x 3   Entered by:   Katina Dung, RN, BSN   Authorized by:   Marca Ancona, MD   Signed by:   Katina Dung, RN, BSN on 05/15/2010   Method used:   Faxed to ...       [facility] (mail-order)             , [place]         Ph: [phone]       Fax: [phone]   RxID:   [id number] BIDIL 20-37.5 MG TABS (ISOSORB DINITRATE-HYDRALAZINE) one three times a day  #45 x 11   Entered by:   Katina Dung, RN, BSN   Authorized by:   Marca Ancona, MD   Signed by:   Katina Dung, RN, BSN on 05/15/2010   Method used:   [facility] to        [facility]  [address] [id number]       [address]       [place], [place]  [address]       Ph: [phone] or [phone]       Fax: [phone]   RxID:   [id number]    Current Medications (verified): 1)  Aspirin 81 Mg Tbec (Aspirin) .... Take One Tablet By Mouth Daily 2)  Lipitor 20 Mg Tabs (Atorvastatin Calcium) .... Take One Tablet Once Daily 3)  Coreg 12.5 Mg Tabs  (Carvedilol) .... One Twice A Day 4)  Enalapril Maleate 10 Mg Tabs (Enalapril Maleate) .... One Twice A Day 5)  Klor-Con M20 20 Meq Cr-Tabs (Potassium Chloride Crys Cr) .... Take One Tablet Once Daily 6)  Spironolactone 25 Mg Tabs (Spironolactone) .... One Tablet Daily 7)  Furosemide 20 Mg Tabs (Furosemide) .... Take One Tablet Daily 8)  Bidil 20-37.5 Mg Tabs (Isosorb Dinitrate-Hydralazine) .... One Three Times A Day  Allergies: 1)  ! Pcn

## 2010-10-06 NOTE — Assessment & Plan Note (Signed)
Summary: 2 MONTH ROV.SL  Medications Added COREG 6.25 MG TABS (CARVEDILOL) take one tablet two times a day COREG 12.5 MG TABS (CARVEDILOL) one twice a day ENALAPRIL MALEATE 10 MG TABS (ENALAPRIL MALEATE) one twice a day ENALAPRIL MALEATE 5 MG TABS (ENALAPRIL MALEATE) take 2 tablets two times a day`      Allergies Added:   Primary Provider:  Dr. Wynelle Link  CC:  2 month rov.  Pt still working on quitting smoking.  Pt feeling well.   [name]  History of Present Illness: 50 yo with minimal past history presents returns for evaluation of newly-diagnosed cardiomyopathy.  Patient presented to [facility] in 3/11 after 2-3 weeks of shortness of breath.  He had a flu-like illness with congestion and myalgias and had been treated with clarithromycin as an outpatient.  However, he became progressively short of breath so went into the hospital.  He was noted to have BNP 976 and pulmonary edema on CXR.  Echo showed EF 20-25%.  Left heart cath was done showing mild coronary disease, suggesting that this was likely a nonischemic cardiomyopathy.  Patient was diuresed and begun on cardiac meds.  Cardiac MRI showed EF 20% without significant delayed enhancement, so no evidence for infiltrative disease.    Since discharge he has been doing well.  No more dyspnea.  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  He does a lot of walking on the job and is not having any problems.  He is able to push mow his lawn without difficulty.  He can walk up a hill without problem.  Weight has been stable.  No chest pain, no orthopnea/PND, no lightheadedness.  He does not know his family history (adopted).  He uses ETOH moderately heavily (3-4 drinks 3 times a week).  He does not use drugs.  HIV was negative.  He is working on cutting back on smoking.   Labs (3/11): UPEP/SPEP negative, ANA negative, urine drug screen negative, HIV negative, TSH normal, LDL 158, HDL 106, TGs 200, BNP 976, ESR 3, K 4, creatinine 1.01 Labs (4/11): BNP  212, K 4.2, creatinine 0.9 Labs (6/11): BNP 81, K 4.1, creatinine 0.9  Current Medications (verified): 1)  Aspirin 81 Mg Tbec (Aspirin) .... Take One Tablet By Mouth Daily 2)  Lipitor 20 Mg Tabs (Atorvastatin Calcium) .... Take One Tablet Once Daily 3)  Coreg 6.25 Mg Tabs (Carvedilol) .... Take One Tablet Two Times A Day 4)  Enalapril Maleate 5 Mg Tabs (Enalapril Maleate) .... Take 2 Tablets Two Times A Day` 5)  Klor-Con M20 20 Meq Cr-Tabs (Potassium Chloride Crys Cr) .... Take One Tablet Once Daily 6)  Spironolactone 25 Mg Tabs (Spironolactone) .... One Tablet Daily 7)  Furosemide 20 Mg Tabs (Furosemide) .... Take One Tablet Daily  Allergies (verified): 1)  ! Pcn  Past History:  Past Medical History: Last updated: 01/05/2010 1. Nonischemic Cardiomyopathy: LHC (3/11) showed EF 15%, 40% ostial CFX, 40% prox RCA, 40-50% mid RCA.  Echo (3/11): Moderately dilated LV, EF 20-25%, global HK, severe diastolic dysfunction, mild RV dilation and dysfunction, moderate MR. SPEP/UPEP negative, ANA negative, HIV negative, TSH normal, no drug history.  Moderate ETOH use.  Highest suspicion probably viral myocarditis.   Cardiac MRI (4/11) showed a moderately dilated LV with global systolic dysfunction, EF 20%, normal RV size with moderate systolic dysfunction, small area of nonspecific subepicardial basal inferoseptal (RV insertion site) delayed enhancement.  2. Nonobstructive CAD: LHC as above with mild disease.  3.  Smoking 4. Hyperlipidemia  Family History: Reviewed history from 11/26/2009 and no changes required. The patient is adopted so does not know his family history.   Social History: Reviewed history from 01/05/2010 and no changes required. 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  smoked half pack per day for over 30 years, now down to 3-4 cigs/day Alcohol Use - yes, usually liquor three times a week (3-4 drinks at a time) Married , has two children.  One is 14 and  one is 20.      Review of Systems       All systems reviewed and negative except as per HPI.   Vital Signs:  Patient profile:   50 year old male Height:      68 inches Weight:      146 pounds BMI:     22.28 Pulse rate:   86 / minute Pulse rhythm:   regular BP sitting:   116 / 70  (left arm) Cuff size:   regular  Vitals Entered By: Judithe Modest CMA (March 16, 2010 9:05 AM)  Physical Exam  General:  Well developed, well nourished, in no acute distress. Neck:  Neck supple, no JVD. No masses, thyromegaly or abnormal cervical nodes. Lungs:  Clear bilaterally to auscultation and percussion. Heart:  Non-displaced PMI, chest non-tender; regular rate and rhythm, S1, S2 without murmurs, rubs or gallops. Carotid upstroke normal, no bruit.  Pedals normal pulses. No edema, no varicosities. Abdomen:  Bowel sounds positive; abdomen soft and non-tender without masses, organomegaly, or hernias noted. No hepatosplenomegaly. Extremities:  No clubbing or cyanosis. Neurologic:  Alert and oriented x 3. Psych:  Normal affect.   Impression & Recommendations:  Problem # 1:  CHRONIC SYSTOLIC HEART FAILURE (ICD-428.22) Nonischemic cardiomyopathy of uncertain etiology.  Probably due to viral myocarditis versus possibly ETOH.  Cannot rule out familial cardiomyopathy as patient was adopted and does not know family history.  Cardiac MRI did not show significant delayed enhancement, so no evidence for infiltrative disease.  Patient appears euvolemic on exam and has minimal exertional symptoms.  Weight is stable.  He increased enalapril to 10 mg bid rather than Coreg at last appointment, so today I will have him increase Coreg to 12.5 mg bid.  Continue current dose of enalapril and spironolactone.  BP check in 2 weeks and if not hypotensive, will start Bidil 1 tab tid.   Echo in 9/11 to followup LV function on therapy.  If still < 35%, will need ICD.    Problem # 2:  SMOKER (ICD-305.1) Patient working on  quitting on his own.  He is down to < 1/2 ppd.   Other Orders: Echocardiogram (Echo) TLB-BNP (B-Natriuretic Peptide) (83880-BNPR) TLB-BMP (Basic Metabolic Panel-BMET) (80048-METABOL)  Patient Instructions: 1)  Your physician has recommended you make the following change in your medication:  2)  Increase Coreg(carvedilol) to 12.5mg  twice a day.--you can take two 6.25mg  tablets twice a day. 3)  Increae Enalapril to 10mg  twice a day. 4)  Lab today --BMP/BNP 428.22 5)  Nurse Visit middle of August to check your blood pressure and pulse. If it is OK Dr Shirlee Latch will start you on  medication called BiDil. 6)  Your physician has requested that you have an echocardiogram.  Echocardiography is a painless test that uses sound waves to create images of your heart. It provides your doctor with information about the size and shape of your heart and how well your heart's chambers and valves are  working.  This procedure takes approximately one hour. There are no restrictions for this procedure. SEPTEMBER 2011 7)  Your physician recommends that you schedule a follow-up appointment in: September with Dr Shirlee Latch a few days after you have the echo done. Prescriptions: ENALAPRIL MALEATE 10 MG TABS (ENALAPRIL MALEATE) one twice a day  #180 x 3   Entered by:   Katina Dung, RN, BSN   Authorized by:   Marca Ancona, MD   Signed by:   Katina Dung, RN, BSN on 03/16/2010   Method used:   Faxed to ...       [facility] (mail-order)             , [place]         Ph: [phone]       Fax: [phone]   RxID:   [id number] FUROSEMIDE 20 MG TABS (FUROSEMIDE) take one tablet daily  #90 x 3   Entered by:   Katina Dung, RN, BSN   Authorized by:   Marca Ancona, MD   Signed by:   Katina Dung, RN, BSN on 03/16/2010   Method used:   Faxed to ...       [facility] (mail-order)             , [place]         Ph: [phone]       Fax: [phone]   RxID:   [id number] SPIRONOLACTONE 25 MG TABS (SPIRONOLACTONE) one tablet  daily  #90 x 3   Entered by:   Katina Dung, RN, BSN   Authorized by:   Marca Ancona, MD   Signed by:   Katina Dung, RN, BSN on 03/16/2010   Method used:   Faxed to ...       [facility] (mail-order)             , [place]         Ph: [phone]       Fax: [phone]   RxID:   [id number] KLOR-CON M20 20 MEQ CR-TABS (POTASSIUM CHLORIDE CRYS CR) take one tablet once daily  #90 x 3   Entered by:   Katina Dung, RN, BSN   Authorized by:   Marca Ancona, MD   Signed by:   Katina Dung, RN, BSN on 03/16/2010   Method used:   Faxed to ...       [facility] (mail-order)             , [place]         Ph: [phone]       Fax: [phone]   RxID:   [id number] COREG 12.5 MG TABS (CARVEDILOL) one twice a day  #180 x 3   Entered by:   Katina Dung, RN, BSN   Authorized by:   Marca Ancona, MD   Signed by:   Katina Dung, RN, BSN on 03/16/2010   Method used:   Faxed to ...       [facility] (mail-order)             , [place]         Ph: [phone]       Fax: [phone]   RxID:   [id number] LIPITOR 20 MG TABS (ATORVASTATIN CALCIUM) take one tablet once daily  #90 x 3   Entered by:   Katina Dung, RN, BSN   Authorized by:   Marca Ancona, MD   Signed by:   Katina Dung, RN, BSN on 03/16/2010   Method used:  Faxed to ...       [facility] (mail-order)             , [place]         Ph: [phone]       Fax: [phone]   RxID:   [id number] ENALAPRIL MALEATE 10 MG TABS (ENALAPRIL MALEATE) one twice a day  #10 x 11   Entered by:   Katina Dung, RN, BSN   Authorized by:   Marca Ancona, MD   Signed by:   Katina Dung, RN, BSN on 03/16/2010   Method used:   [facility]ronically to        [facility]  [address] [id number]       [address]       [place], [place]  [address]       Ph: [phone] or [phone]       Fax: [phone]   RxID:   [id number]

## 2010-10-06 NOTE — Progress Notes (Signed)
Summary: test results  Phone Note Call from Patient Call back at Home Phone [phone] Call back at Work Phone [phone]   Caller: Patient Reason for Call: Lab or Test Results Initial call taken by: Judie Grieve,  June 26, 2010 3:42 PM  Follow-up for Phone Call        Pt aware of cholesterol results. Mylo Red RN

## 2010-10-06 NOTE — Letter (Signed)
Summary: [facility] Walk-In Patient Form  [facility] Walk-In Patient Form   Imported By: Marylou Mccoy 06/11/2010 13:38:01  _____________________________________________________________________  External Attachment:    Type:   Image     Comment:   External Document

## 2010-10-08 NOTE — Assessment & Plan Note (Signed)
Summary: PER CHECK OUT/SF   Visit Type:  Follow-up Primary Provider:  Dr. Wynelle Link  CC:  pt has no complaints today.  History of Present Illness: 50 yo with minimal past history presents returns for evaluation of nonischemic cardiomyopathy.  Patient presented to [facility] in 3/11 after 2-3 weeks of shortness of breath.  He had a flu-like illness with congestion and myalgias and had been treated with clarithromycin as an outpatient.  However, he became progressively short of breath so went into the hospital.  He was noted to have BNP 976 and pulmonary edema on CXR.  Echo showed EF 20-25%.  Left heart cath was done showing mild coronary disease, suggesting that this was likely a nonischemic cardiomyopathy.  Patient was diuresed and begun on cardiac meds.  Cardiac MRI showed EF 20% without significant delayed enhancement, so no evidence for infiltrative disease.    Since discharge he has been doing well.  No more dyspnea.  He works as a Electrical engineer at [employer]  He does a lot of walking on the job and is not having any problems.  He can walk up a hill without problem.  Weight has been stable.  No chest pain, no orthopnea/PND, no lightheadedness.  He is now smoking 2-3 cigarettes per day.  Bidil-related headaches have resolved.    Echo was repeated 9/11, showing some improvement.  Would estimate EF at 40% with diffuse hypokinesis.   Labs (3/11): UPEP/SPEP negative, ANA negative, urine drug screen negative, HIV negative, TSH normal, LDL 158, HDL 106, TGs 200, BNP 976, ESR 3, K 4, creatinine 1.01 Labs (4/11): BNP 212, K 4.2, creatinine 0.97 Labs (7/11): BNP 81, K 4.1, creatinine 0.9 Labs (10/11): K 4.2, creatinine 0.8, LDL 98, HDL 68  ECG: NSR, LVH  Current Medications (verified): 1)  Aspirin 81 Mg Tbec (Aspirin) .... Take One Tablet By Mouth Daily 2)  Lipitor 40 Mg Tabs (Atorvastatin Calcium) .... One Daily 3)  Coreg 12.5 Mg Tabs (Carvedilol) .... One Twice A Day 4)  Enalapril Maleate 10  Mg Tabs (Enalapril Maleate) .... One Twice A Day 5)  Spironolactone 25 Mg Tabs (Spironolactone) .... One Tablet Daily 6)  Bidil 20-37.5 Mg Tabs (Isosorb Dinitrate-Hydralazine) .... One Three Times A Day  Allergies: 1)  ! Pcn  Past History:  Past Medical History: Reviewed history from 06/08/2010 and no changes required. 1. Nonischemic Cardiomyopathy: LHC (3/11) showed EF 15%, 40% ostial CFX, 40% prox RCA, 40-50% mid RCA.  Echo (3/11): Moderately dilated LV, EF 20-25%, global HK, severe diastolic dysfunction, mild RV dilation and dysfunction, moderate MR. SPEP/UPEP negative, ANA negative, HIV negative, TSH normal, no drug history.  Moderate ETOH use.  Highest suspicion probably viral myocarditis.   Cardiac MRI (4/11) showed a moderately dilated LV with global systolic dysfunction, EF 20%, normal RV size with moderate systolic dysfunction, small area of nonspecific subepicardial basal inferoseptal (RV insertion site) delayed enhancement.  Repeat echo (9/11): EF 40% with mild to moderate diffuse hypokinesis.  2. Nonobstructive CAD: LHC as above with mild disease.  3. Smoking 4. Hyperlipidemia  Family History: Reviewed history from 11/26/2009 and no changes required. The patient is adopted so does not know his family history.   Social History: Reviewed history from 03/16/2010 and no changes required. 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  smoked half pack per day for over 30 years, now down to 3-4 cigs/day Alcohol Use - yes, usually liquor three times a week (3-4 drinks at a  time) Married , has two children.  One is 14 and one is 20.      Vital Signs:  Patient profile:   50 year old male Height:      68 inches Weight:      151 pounds BMI:     23.04 Pulse rate:   72 / minute Resp:     18 per minute BP sitting:   108 / 68  (left arm)  Vitals Entered By: Caralee Ates CMA (September 30, 2010 2:55 PM)  Physical Exam  General:  Well developed, well nourished, in  no acute distress. Neck:  Neck supple, no JVD. No masses, thyromegaly or abnormal cervical nodes. Lungs:  Clear bilaterally to auscultation and percussion. Heart:  Non-displaced PMI, chest non-tender; regular rate and rhythm, S1, S2 without murmurs, rubs or gallops. Carotid upstroke normal, no bruit.  Pedals normal pulses. No edema, no varicosities. Abdomen:  Bowel sounds positive; abdomen soft and non-tender without masses, organomegaly, or hernias noted. No hepatosplenomegaly. Extremities:  No clubbing or cyanosis. Neurologic:  Alert and oriented x 3. Psych:  Normal affect.   Impression & Recommendations:  Problem # 1:  SMOKER (ICD-305.1) Cutting back slowly.  Counselled him to keep trying to quit.   Problem # 2:  CAD (ICD-414.00) Mild coronary disease on cath (maximal 40-50% stenosis in RCA).  Continue ASA 81 mg daily.  Needs to continue statin for LDL lowering.  Needs to quit smoking completely.    Problem # 3:  CHRONIC SYSTOLIC HEART FAILURE (ICD-428.22) Nonischemic cardiomyopathy of uncertain etiology.  Probably due to viral myocarditis versus possibly ETOH.  Cannot rule out familial cardiomyopathy as patient was adopted and does not know family history.  Cardiac MRI did not show significant delayed enhancement, so no evidence for infiltrative disease.  Patient appears euvolemic on exam and has minimal exertional symptoms.  Weight is stable.  Echo in 9/11 showed improvement, EF 40%.  ICD not indicated at this time.  Continue enalapril, spironolactone, and Bidil.  Will increase Coreg to 18.75 mg two times a day.  He appears euvolemic currently.  He will monitor his weight and restart Lasix if he gains more than 3 lbs in a week. BMET/BNP today.  Echo in 9/12 to follow EF and followup appointment in 6 months.   Other Orders: Echocardiogram (Echo) TLB-BMP (Basic Metabolic Panel-BMET) (80048-METABOL) TLB-BNP (B-Natriuretic Peptide) (83880-BNPR)  Patient Instructions: 1)  Your physician  has recommended you make the following change in your medication:  2)  Increase Coreg(carvedilol) to 18.75mg  twice a day--this will be one and one-half 12.5mg  twice a day. 3)  LAb today---BMP/BNP  428.22 4)  Your physician wants you to follow-up in: 6 months with Dr Harmon Dun 2012)  You will receive a reminder letter in the mail two months in advance. If you don't receive a letter, please call our office to schedule the follow-up appointment. 5)  Lab when you see Dr Shirlee Latch in 6 months---BNP/BMP 428.22 6)  Your physician has requested that you have an echocardiogram.  Echocardiography is a painless test that uses sound waves to create images of your heart. It provides your doctor with information about the size and shape of your heart and how well your heart's chambers and valves are working.  This procedure takes approximately one hour. There are no restrictions for this procedure. SEPTEMBER 2012 Prescriptions: COREG 12.5 MG TABS (CARVEDILOL) one and one-half  twice a day  #90 x 6   Entered by:   Katina Dung, RN,  BSN   Authorized by:   Marca Ancona, MD   Signed by:   Marca Ancona, MD on 10/01/2010   Method used:   [facility]  [address] [id number]       [address]       [place], [place]  [address]       Ph: [phone] or [phone]       Fax: [phone]   RxID:   [id number]

## 2010-10-08 NOTE — Letter (Signed)
Summary: [facility] - Walk-In Patient Form   - Walk-In Patient Form   Imported By: Marylou Mccoy 09/14/2010 17:29:01  _____________________________________________________________________  External Attachment:    Type:   Image     Comment:   External Document

## 2010-11-25 LAB — BASIC METABOLIC PANEL
BUN: 20 mg/dL (ref 6–23)
Creatinine, Ser: 1.12 mg/dL (ref 0.4–1.5)
GFR calc Af Amer: >60 mL/min (ref >60)
GFR calc non Af Amer: >60 mL/min (ref >60)

## 2010-11-30 LAB — HEMOGLOBIN A1C
Hgb A1c MFr Bld: 5.6 % (ref 4.6–6.1)
Mean Plasma Glucose: 114 mg/dL

## 2010-11-30 LAB — CBC
HCT: 44.7 % (ref 39.0–52.0)
HCT: 45.7 % (ref 39.0–52.0)
HCT: 48.1 % (ref 39.0–52.0)
Hemoglobin: 13.8 g/dL (ref 13.0–17.0)
Hemoglobin: 15 g/dL (ref 13.0–17.0)
MCHC: 31.2 g/dL (ref 30.0–36.0)
MCV: 89.7 fL (ref 78.0–100.0)
MCV: 89.9 fL (ref 78.0–100.0)
MCV: 90.2 fL (ref 78.0–100.0)
Platelets: 192 10*3/uL (ref 150–400)
Platelets: 209 10*3/uL (ref 150–400)
Platelets: 216 10*3/uL (ref 150–400)
RDW: 15.7 % — ABNORMAL HIGH (ref 11.5–15.5)
RDW: 15.7 % — ABNORMAL HIGH (ref 11.5–15.5)
RDW: 15.9 % — ABNORMAL HIGH (ref 11.5–15.5)
RDW: 16 % — ABNORMAL HIGH (ref 11.5–15.5)
WBC: 6.4 10*3/uL (ref 4.0–10.5)

## 2010-11-30 LAB — DRUGS OF ABUSE SCREEN W/O ALC, ROUTINE URINE
Amphetamine Screen, Ur: NEGATIVE
Cocaine Metabolites: NEGATIVE
Creatinine,U: 171.4 mg/dL
Phencyclidine (PCP): NEGATIVE
Propoxyphene: NEGATIVE

## 2010-11-30 LAB — COMPREHENSIVE METABOLIC PANEL
ALT: 14 U/L (ref 0–53)
AST: 18 U/L (ref 0–37)
AST: 18 U/L (ref 0–37)
Albumin: 3.2 g/dL — ABNORMAL LOW (ref 3.5–5.2)
Albumin: 3.7 g/dL (ref 3.5–5.2)
Alkaline Phosphatase: 71 U/L (ref 39–117)
Alkaline Phosphatase: 72 U/L (ref 39–117)
BUN: 13 mg/dL (ref 6–23)
Calcium: 9.3 mg/dL (ref 8.4–10.5)
Calcium: 9.4 mg/dL (ref 8.4–10.5)
Creatinine, Ser: 0.99 mg/dL (ref 0.4–1.5)
Creatinine, Ser: 1.02 mg/dL (ref 0.4–1.5)
GFR calc Af Amer: >60 mL/min (ref >60)
GFR calc Af Amer: >60 mL/min (ref >60)
GFR calc non Af Amer: >60 mL/min (ref >60)
Glucose, Bld: 102 mg/dL — ABNORMAL HIGH (ref 70–99)
Glucose, Bld: 92 mg/dL (ref 70–99)
Potassium: 3.7 mEq/L (ref 3.5–5.1)
Potassium: 4 mEq/L (ref 3.5–5.1)
Sodium: 139 mEq/L (ref 135–145)
Sodium: 139 mEq/L (ref 135–145)
Total Protein: 6.3 g/dL (ref 6.0–8.3)
Total Protein: 6.7 g/dL (ref 6.0–8.3)
Total Protein: 7.3 g/dL (ref 6.0–8.3)

## 2010-11-30 LAB — UIFE/LIGHT CHAINS/TP QN, 24-HR UR
Alpha 1, Urine: DETECTED — AB
Alpha 2, Urine: DETECTED — AB
Total Protein, Urine: 5.5 mg/dL

## 2010-11-30 LAB — DIFFERENTIAL
Band Neutrophils: 2 % (ref 0–10)
Basophils Absolute: 0.1 10*3/uL (ref 0.0–0.1)
Basophils Relative: 0 % (ref 0–1)
Eosinophils Absolute: 0 10*3/uL (ref 0.0–0.7)
Eosinophils Absolute: 0.1 10*3/uL (ref 0.0–0.7)
Eosinophils Relative: 1 % (ref 0–5)
Monocytes Absolute: 0.5 10*3/uL (ref 0.1–1.0)
Monocytes Absolute: 0.7 10*3/uL (ref 0.1–1.0)
Monocytes Relative: 10 % (ref 3–12)
Neutro Abs: 4 10*3/uL (ref 1.7–7.7)
Neutrophils Relative %: 58 % (ref 43–77)

## 2010-11-30 LAB — PROTEIN ELECTROPHORESIS, SERUM
Albumin ELP: 51.1 % — ABNORMAL LOW (ref 55.8–66.1)
Alpha-1-Globulin: 11 % — ABNORMAL HIGH (ref 2.9–4.9)
Beta 2: 6.1 % (ref 3.2–6.5)
Beta Globulin: 6.6 % (ref 4.7–7.2)

## 2010-11-30 LAB — LIPASE, BLOOD: Lipase: 29 U/L (ref 11–59)

## 2010-11-30 LAB — BASIC METABOLIC PANEL
BUN: 15 mg/dL (ref 6–23)
CO2: 26 mEq/L (ref 19–32)
Chloride: 102 mEq/L (ref 96–112)
Glucose, Bld: 126 mg/dL — ABNORMAL HIGH (ref 70–99)
Potassium: 3.9 mEq/L (ref 3.5–5.1)

## 2010-11-30 LAB — HIV ANTIBODY (ROUTINE TESTING W REFLEX): HIV: NONREACTIVE

## 2010-11-30 LAB — CK TOTAL AND CKMB (NOT AT ARMC)
CK, MB: 1.3 ng/mL (ref 0.3–4.0)
Relative Index: 1.3 (ref 0.0–2.5)
Total CK: 102 U/L (ref 7–232)

## 2010-11-30 LAB — APTT: aPTT: 26 seconds (ref 24–37)

## 2010-11-30 LAB — TROPONIN I: Troponin I: 0.02 ng/mL (ref 0.00–0.06)

## 2010-11-30 LAB — LIPID PANEL
LDL Cholesterol: 158 mg/dL — ABNORMAL HIGH (ref 0–99)
Total CHOL/HDL Ratio: 2.9 RATIO
VLDL: 40 mg/dL (ref 0–40)

## 2010-11-30 LAB — CARDIAC PANEL(CRET KIN+CKTOT+MB+TROPI)
Relative Index: 1.2 (ref 0.0–2.5)
Troponin I: 0.03 ng/mL (ref 0.00–0.06)
Troponin I: 0.04 ng/mL (ref 0.00–0.06)

## 2010-11-30 LAB — PROTIME-INR: INR: 0.87 (ref 0.00–1.49)

## 2010-11-30 LAB — BRAIN NATRIURETIC PEPTIDE: Pro B Natriuretic peptide (BNP): 560 pg/mL — ABNORMAL HIGH (ref 0.0–100.0)

## 2011-01-22 NOTE — Assessment & Plan Note (Signed)
[facility]                         GASTROENTEROLOGY OFFICE NOTE   [name], [name]                    MRN:          [id number]  DATE:10/11/2006                            DOB:          1960-12-15    REASON FOR CONSULTATION:  Bleeding.   ASSESSMENT:  A 50 year old [place] American male with intermittent blood  in his stool/rectal bleeding.  There is some bloating.  There is some  occasional diarrhea, but really no significant bowel habit changes.   RECOMMENDATIONS AND PLAN:  Schedule colonoscopy.  Risks, benefits and  indications were reviewed.  Possibilities include anorectal bleeding,  but certainly colorectal neoplasia should be excluded.   HISTORY:  This man has noted intermittent blood with his stools on the  toilet paper off and on, usually bright red.  Dr. Duaine Dredge recommended  he come see me to get evaluated.  He had a normal hemoglobin back in  August 2007.   GI review of systems is otherwise negative at this time.  See medical  history form for full details.  He has felt bloated, I should note.  That is new.   MEDICATIONS:  None.   PAST MEDICAL HISTORY:  Negative as far as surgeries.  He did have a  triglyceride level of 635 in August 2007.  He is due to follow up.   FAMILY HISTORY:  No colon cancer.  Our review is otherwise negative.   SOCIAL HISTORY:  He is married, 2 daughters.  He is a Investment banker, operational at [employer].  He smokes 1/2 pack per day, and is  counseled to quit.  Alcohol 3 times a week.   REVIEW OF SYSTEMS:  No night sweats, dyspnea, insomnia.  All other  systems negative.   PHYSICAL EXAMINATION:  Well-developed, well-nourished black male.  Height 5 feet 8 inches.  Weight 164 pounds.  Blood pressure 130/90.  Pulse 85.  Eyes anicteric.  ENT normal.  NECK:  Supple is supple.  No thyromegaly or mass.  CHEST:  Clear.  HEART:  S1 and S2.  I hear no rubs, murmurs or gallops.  There may be an  intermittent S4, however, at times.  ABDOMEN:  Soft and non-tender.  No organomegaly or mass.  RECTAL EXAM:  Deferred.  LYMPHATIC:  No supraclavicular nodes.  EXTREMITIES:  No peripheral edema.  SKIN:  No rash.  PSYCHIATRIC:  He is alert and oriented x3.   I appreciate the opportunity to care for this patient.  I have reviewed  Dr. Geoffery Lyons office notes and labs that he has sent to our office.     Iva Boop, MD,FACG  Electronically Signed    [facility]  DD: 10/11/2006  DT: 10/11/2006  Job #: [id number]   cc:   Mosetta Putt, M.D.

## 2011-03-01 ENCOUNTER — Telehealth: Payer: Self-pay | Admitting: Cardiology

## 2011-03-01 MED ORDER — SPIRONOLACTONE 25 MG PO TABS: 25.0000 mg | ORAL_TABLET | Freq: Every day | ORAL | Status: DC

## 2011-03-01 NOTE — Telephone Encounter (Signed)
Spironolactone  [facility] mg. [facility] [address]

## 2011-03-02 ENCOUNTER — Telehealth: Payer: Self-pay | Admitting: Cardiology

## 2011-03-02 ENCOUNTER — Other Ambulatory Visit: Payer: Self-pay | Admitting: *Deleted

## 2011-03-02 MED ORDER — SPIRONOLACTONE 25 MG PO TABS: 25.0000 mg | ORAL_TABLET | Freq: Every day | ORAL | Status: DC

## 2011-03-02 NOTE — Telephone Encounter (Signed)
2 months supply refill given [facility] and [facility] days suplly sent to [facility]. Vikki Ports

## 2011-03-02 NOTE — Telephone Encounter (Signed)
Pt needs refill of spironolactone 25 mg mail order [facility] and will need a 2 week supply @ [facility] [address] to last until mail order comes in-pt said he  Left this message yesterday but I don't see any documentation

## 2011-04-01 ENCOUNTER — Encounter: Payer: Self-pay | Admitting: Cardiology

## 2011-04-06 ENCOUNTER — Encounter: Payer: Self-pay | Admitting: Cardiology

## 2011-04-06 ENCOUNTER — Ambulatory Visit (INDEPENDENT_AMBULATORY_CARE_PROVIDER_SITE_OTHER): Payer: BC Managed Care – PPO | Admitting: Cardiology

## 2011-04-06 DIAGNOSIS — F17200 Nicotine dependence, unspecified, uncomplicated: Secondary | ICD-10-CM

## 2011-04-06 DIAGNOSIS — E785 Hyperlipidemia, unspecified: Secondary | ICD-10-CM

## 2011-04-06 DIAGNOSIS — E7800 Pure hypercholesterolemia, unspecified: Secondary | ICD-10-CM

## 2011-04-06 DIAGNOSIS — I5022 Chronic systolic (congestive) heart failure: Secondary | ICD-10-CM

## 2011-04-06 MED ORDER — CARVEDILOL 25 MG PO TABS: 25.0000 mg | ORAL_TABLET | Freq: Two times a day (BID) | ORAL | Status: DC

## 2011-04-06 NOTE — Patient Instructions (Signed)
Increase Coreg(carvedilol) to 25mg  twice a day. You can take two 12.5mg  tablets twice a day.  Schedule an appointment for fasting lab--lipid profile/liver profile/BMP  428.22 272.0  Schedule an appointment for an echocardiogram in September  2012.  Your physician wants you to follow-up in: 6 months with Dr Shirlee Latch. (January 2013) You will receive a reminder letter in the mail two months in advance. If you don't receive a letter, please call our office to schedule the follow-up appointment.

## 2011-04-07 NOTE — Assessment & Plan Note (Signed)
Nonischemic cardiomyopathy of uncertain etiology.  Probably due to viral myocarditis versus possibly ETOH.  Cannot rule out familial cardiomyopathy as patient was adopted and does not know family history.  Cardiac MRI did not show significant delayed enhancement, so no evidence for infiltrative disease.  Patient appears euvolemic on exam and has minimal exertional symptoms.  Echo in 9/11 showed improvement, EF 40%.  ICD not indicated at this time.  Continue enalapril, spironolactone, and Bidil.  Will increase Coreg to 25 mg two times a day.  He appears euvolemic currently.  Weight stable off Lasix. BMET/BNP today.  Echo in 9/12 to follow EF and followup appointment in 6 months.

## 2011-04-07 NOTE — Assessment & Plan Note (Signed)
I again strongly counselled him to stop smoking.  He will try the electronic cigarette.

## 2011-04-07 NOTE — Progress Notes (Signed)
PCP: Dr. Wynelle Link  50 yo returns for evaluation of nonischemic cardiomyopathy.  Patient presented to [facility] in 3/11 after 2-3 weeks of shortness of breath.  He had a flu-like illness with congestion and myalgias and had been treated with clarithromycin as an outpatient.  However, he became progressively short of breath so went into the hospital.  He was noted to have BNP 976 and pulmonary edema on CXR.  Echo showed EF 20-25%.  Left heart cath was done showing mild coronary disease, suggesting that this was likely a nonischemic cardiomyopathy.  Patient was diuresed and begun on cardiac meds.  Cardiac MRI showed EF 20% without significant delayed enhancement, so no evidence for infiltrative disease.  Echo was repeated 9/11, showing some improvement.  Would estimate EF at 40% with diffuse hypokinesis.   Patient has been doing well lately.  No exertional dyspnea.  He has been riding his bike for exercise.  Still working full time as a Electrical engineer at [employer]  No chest pain.  No orthopnea/PND.  Weight is up 2 lbs.  He is still smoking 2-3 cigarettes/day.   Labs (3/11): UPEP/SPEP negative, ANA negative, urine drug screen negative, HIV negative, TSH normal, LDL 158, HDL 106, TGs 200, BNP 976, ESR 3, K 4, creatinine 1.01 Labs (4/11): BNP 212, K 4.2, creatinine 0.97 Labs (7/11): BNP 81, K 4.1, creatinine 0.9 Labs (10/11): K 4.2, creatinine 0.8, LDL 98, HDL 68 Labs (1/12): K 3.8, creatinine 0.9, BNP 70  ECG: NSR, LVH  Allergies:  1)  ! Pcn  Past Medical History: 1. Nonischemic Cardiomyopathy: LHC (3/11) showed EF 15%, 40% ostial CFX, 40% prox RCA, 40-50% mid RCA.  Echo (3/11): Moderately dilated LV, EF 20-25%, global HK, severe diastolic dysfunction, mild RV dilation and dysfunction, moderate MR. SPEP/UPEP negative, ANA negative, HIV negative, TSH normal, no drug history.  Moderate ETOH use.  Highest suspicion probably viral myocarditis.   Cardiac MRI (4/11) showed a moderately dilated LV with  global systolic dysfunction, EF 20%, normal RV size with moderate systolic dysfunction, small area of nonspecific subepicardial basal inferoseptal (RV insertion site) delayed enhancement.  Repeat echo (9/11): EF 40% with mild to moderate diffuse hypokinesis.  2. Nonobstructive CAD: LHC as above with mild disease.  3. Smoking 4. Hyperlipidemia  Family History: The patient is adopted so does not know his family history.   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  smoked half pack per day for over 30 years, now down to 3-4 cigs/day Alcohol Use - yes, usually liquor three times a week (3-4 drinks at a time) Married , has two children.  One is 14 and one is 20.   ROS: All systems reviewed and negative except as per HPI.   Current Outpatient Prescriptions  Medication Sig Dispense Refill  . aspirin (ASPIR-81) 81 MG EC tablet Take 81 mg by mouth daily.        Marland Kitchen atorvastatin (LIPITOR) 40 MG tablet Take 40 mg by mouth daily.        . enalapril (VASOTEC) 10 MG tablet Take 10 mg by mouth 2 (two) times daily.        . isosorbide-hydrALAZINE (BIDIL) 20-37.5 MG per tablet Take 1 tablet by mouth 3 (three) times daily.        Marland Kitchen spironolactone (ALDACTONE) 25 MG tablet Take 1 tablet (25 mg total) by mouth daily.  90 tablet  3  . carvedilol (COREG) 25 MG tablet Take 1 tablet (25  mg total) by mouth 2 (two) times daily.  180 tablet  3    BP 126/70  Pulse 80  Resp 18  Ht 5\' 8"  (1.727 m)  Wt 153 lb (69.4 kg)  BMI 23.26 kg/m2 General: NAD Neck: No JVD, no thyromegaly or thyroid nodule.  Lungs: Clear to auscultation bilaterally with normal respiratory effort. CV: Nondisplaced PMI.  Heart regular S1/S2, no S3/S4, no murmur.  No peripheral edema.  No carotid bruit.  Normal pedal pulses.  Abdomen: Soft, nontender, no hepatosplenomegaly, no distention.  Neurologic: Alert and oriented x 3.  Psych: Normal affect. Extremities: No clubbing or cyanosis.

## 2011-04-07 NOTE — Assessment & Plan Note (Signed)
Check lipids today 

## 2011-04-12 ENCOUNTER — Other Ambulatory Visit: Payer: Self-pay | Admitting: Cardiology

## 2011-04-12 ENCOUNTER — Other Ambulatory Visit (INDEPENDENT_AMBULATORY_CARE_PROVIDER_SITE_OTHER): Payer: BC Managed Care – PPO | Admitting: *Deleted

## 2011-04-12 DIAGNOSIS — I5022 Chronic systolic (congestive) heart failure: Secondary | ICD-10-CM

## 2011-04-12 DIAGNOSIS — E7800 Pure hypercholesterolemia, unspecified: Secondary | ICD-10-CM

## 2011-04-12 LAB — HEPATIC FUNCTION PANEL
AST: 23 U/L (ref 0–37)
Alkaline Phosphatase: 63 U/L (ref 39–117)
Bilirubin, Direct: 0 mg/dL (ref 0.0–0.3)
Total Bilirubin: 0.9 mg/dL (ref 0.3–1.2)

## 2011-04-12 LAB — LIPID PANEL
Total CHOL/HDL Ratio: 2
VLDL: 41.4 mg/dL — ABNORMAL HIGH (ref 0.0–40.0)

## 2011-04-12 LAB — BASIC METABOLIC PANEL
CO2: 24 mEq/L (ref 19–32)
Calcium: 8.9 mg/dL (ref 8.4–10.5)
Chloride: 98 mEq/L (ref 96–112)
Creatinine, Ser: 0.9 mg/dL (ref 0.4–1.5)
Glucose, Bld: 85 mg/dL (ref 70–99)

## 2011-04-15 ENCOUNTER — Telehealth: Payer: Self-pay | Admitting: *Deleted

## 2011-04-15 NOTE — Telephone Encounter (Signed)
Pt calls today for cholesterol results.  Results given and Dr. Alford Highland recommendation to take fish oil 2000mg  daily. Mylo Red RN

## 2011-05-18 ENCOUNTER — Encounter: Payer: Self-pay | Admitting: *Deleted

## 2011-05-24 ENCOUNTER — Other Ambulatory Visit (HOSPITAL_COMMUNITY): Payer: BC Managed Care – PPO | Admitting: Radiology

## 2011-05-31 ENCOUNTER — Other Ambulatory Visit: Payer: Self-pay | Admitting: Cardiology

## 2011-05-31 ENCOUNTER — Ambulatory Visit (HOSPITAL_COMMUNITY): Payer: BC Managed Care – PPO | Attending: Cardiology | Admitting: Radiology

## 2011-05-31 DIAGNOSIS — F17200 Nicotine dependence, unspecified, uncomplicated: Secondary | ICD-10-CM | POA: Insufficient documentation

## 2011-05-31 DIAGNOSIS — I509 Heart failure, unspecified: Secondary | ICD-10-CM | POA: Insufficient documentation

## 2011-05-31 DIAGNOSIS — I059 Rheumatic mitral valve disease, unspecified: Secondary | ICD-10-CM | POA: Insufficient documentation

## 2011-05-31 DIAGNOSIS — E7800 Pure hypercholesterolemia, unspecified: Secondary | ICD-10-CM

## 2011-05-31 DIAGNOSIS — I079 Rheumatic tricuspid valve disease, unspecified: Secondary | ICD-10-CM | POA: Insufficient documentation

## 2011-05-31 DIAGNOSIS — R0609 Other forms of dyspnea: Secondary | ICD-10-CM | POA: Insufficient documentation

## 2011-05-31 DIAGNOSIS — I428 Other cardiomyopathies: Secondary | ICD-10-CM | POA: Insufficient documentation

## 2011-05-31 DIAGNOSIS — E785 Hyperlipidemia, unspecified: Secondary | ICD-10-CM | POA: Insufficient documentation

## 2011-05-31 DIAGNOSIS — R0989 Other specified symptoms and signs involving the circulatory and respiratory systems: Secondary | ICD-10-CM | POA: Insufficient documentation

## 2011-05-31 DIAGNOSIS — I5022 Chronic systolic (congestive) heart failure: Secondary | ICD-10-CM

## 2011-05-31 NOTE — Telephone Encounter (Signed)
Walk In pt Form " Pt would like his Rx's to be mailed to [facility]" sent to Message nurse Thurston Hole L not in Office Today)   05/31/11/km

## 2011-06-01 ENCOUNTER — Telehealth: Payer: Self-pay | Admitting: Cardiology

## 2011-06-01 MED ORDER — ENALAPRIL MALEATE 10 MG PO TABS: 10.0000 mg | ORAL_TABLET | Freq: Two times a day (BID) | ORAL | Status: DC

## 2011-06-01 MED ORDER — ATORVASTATIN CALCIUM 20 MG PO TABS: 20.0000 mg | ORAL_TABLET | Freq: Every day | ORAL | Status: DC

## 2011-06-01 MED ORDER — SPIRONOLACTONE 25 MG PO TABS: 25.0000 mg | ORAL_TABLET | Freq: Every day | ORAL | Status: DC

## 2011-06-01 NOTE — Telephone Encounter (Signed)
I talked with pt about recent echo resutls

## 2011-06-01 NOTE — Telephone Encounter (Signed)
Pt rtn your call

## 2011-06-01 NOTE — Telephone Encounter (Signed)
Addended by: Jacqlyn Krauss on: 06/01/2011 01:24 PM   Modules accepted: Orders

## 2011-06-01 NOTE — Telephone Encounter (Signed)
I talked with pt about recent echo results. 

## 2011-06-01 NOTE — Telephone Encounter (Signed)
Addended by: Jacqlyn Krauss on: 06/01/2011 12:31 PM   Modules accepted: Orders

## 2011-06-01 NOTE — Telephone Encounter (Signed)
Pt returning call to Anne. Please call back.  

## 2011-06-01 NOTE — Telephone Encounter (Signed)
Pt also requesting lipitor refill. He verifed he is taking lipitor 20mg  daily and not lipitor 40mg  daily.

## 2011-06-01 NOTE — Telephone Encounter (Signed)
LMTCB

## 2011-08-19 ENCOUNTER — Telehealth: Payer: Self-pay | Admitting: Cardiology

## 2011-08-19 ENCOUNTER — Ambulatory Visit: Payer: Self-pay

## 2011-08-19 DIAGNOSIS — R509 Fever, unspecified: Secondary | ICD-10-CM

## 2011-08-19 NOTE — Telephone Encounter (Signed)
Error

## 2012-02-05 ENCOUNTER — Encounter: Payer: Self-pay | Admitting: Cardiology

## 2012-11-21 ENCOUNTER — Encounter: Payer: Self-pay | Admitting: *Deleted

## 2012-11-27 ENCOUNTER — Ambulatory Visit (INDEPENDENT_AMBULATORY_CARE_PROVIDER_SITE_OTHER): Payer: BC Managed Care – PPO | Admitting: Cardiology

## 2012-11-27 ENCOUNTER — Encounter: Payer: Self-pay | Admitting: Cardiology

## 2012-11-27 VITALS — BP 158/90 | HR 78 | Ht 68.0 in | Wt 148.0 lb

## 2012-11-27 DIAGNOSIS — F17200 Nicotine dependence, unspecified, uncomplicated: Secondary | ICD-10-CM

## 2012-11-27 DIAGNOSIS — E785 Hyperlipidemia, unspecified: Secondary | ICD-10-CM

## 2012-11-27 DIAGNOSIS — I5022 Chronic systolic (congestive) heart failure: Secondary | ICD-10-CM

## 2012-11-27 DIAGNOSIS — I2510 Atherosclerotic heart disease of native coronary artery without angina pectoris: Secondary | ICD-10-CM

## 2012-11-27 DIAGNOSIS — Z8679 Personal history of other diseases of the circulatory system: Secondary | ICD-10-CM

## 2012-11-27 MED ORDER — ENALAPRIL MALEATE 20 MG PO TABS: 20.0000 mg | ORAL_TABLET | Freq: Two times a day (BID) | ORAL | Status: DC

## 2012-11-27 MED ORDER — ISOSORB DINITRATE-HYDRALAZINE 20-37.5 MG PO TABS: 1.0000 | ORAL_TABLET | Freq: Three times a day (TID) | ORAL | Status: DC

## 2012-11-27 MED ORDER — ATORVASTATIN CALCIUM 20 MG PO TABS: 20.0000 mg | ORAL_TABLET | Freq: Every day | ORAL | Status: DC

## 2012-11-27 MED ORDER — SPIRONOLACTONE 25 MG PO TABS: 25.0000 mg | ORAL_TABLET | Freq: Every day | ORAL | Status: DC

## 2012-11-27 MED ORDER — ENALAPRIL MALEATE 10 MG PO TABS: 20.0000 mg | ORAL_TABLET | Freq: Two times a day (BID) | ORAL | Status: DC

## 2012-11-27 MED ORDER — CARVEDILOL 25 MG PO TABS: 25.0000 mg | ORAL_TABLET | Freq: Two times a day (BID) | ORAL | Status: DC

## 2012-11-27 NOTE — Patient Instructions (Addendum)
Increase enalapril to 20mg  two times a day. You can take 2 of your 10mg  tablets two times a day and use your current supply.  Your physician recommends that you have  lab work today--BMET.   Your physician recommends that you return for a FASTING lipid profile /BMET in 3 months.   Your physician wants you to follow-up in: 6 months with Dr Shirlee Latch. (September 2014).You will receive a reminder letter in the mail two months in advance. If you don't receive a letter, please call our office to schedule the follow-up appointment.

## 2012-11-28 LAB — BASIC METABOLIC PANEL
BUN: 14 mg/dL (ref 6–23)
CO2: 28 mEq/L (ref 19–32)
Calcium: 9.1 mg/dL (ref 8.4–10.5)
GFR: 118.7 mL/min (ref >60.00)
Glucose, Bld: 100 mg/dL — ABNORMAL HIGH (ref 70–99)
Potassium: 3.5 mEq/L (ref 3.5–5.1)

## 2012-11-28 NOTE — Progress Notes (Signed)
Patient ID: [name], male   DOB: 1960-09-26, 52 y.o.   MRN: [id number] PCP: Dr. Wynelle Link  52 yo returns for evaluation of nonischemic cardiomyopathy.  Patient presented to [facility] in 3/11 after 2-3 weeks of shortness of breath.  He had a flu-like illness with congestion and myalgias and had been treated with clarithromycin as an outpatient.  However, he became progressively short of breath so went into the hospital.  He was noted to have BNP 976 and pulmonary edema on CXR.  Echo showed EF 20-25%.  Left heart cath was done showing mild coronary disease, suggesting that this was likely a nonischemic cardiomyopathy.  Patient was diuresed and begun on cardiac meds.  Cardiac MRI showed EF 20% without significant delayed enhancement, so no evidence for infiltrative disease.  Echo was repeated 9/11, showing some improvement.  Would estimate EF at 40% with diffuse hypokinesis.  Patient has been lost to followup for a couple of years.  He has continued all his meds but was running out so called for an appointment.   Patient has been doing well lately.  No exertional dyspnea.  He has been riding his bike for exercise.  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  No orthopnea/PND.  Weight is down 5 lbs since the last time I saw him.  He is still smoking 2-3 cigarettes/day.  BP is running high.  He does not know what it is at home.  He took all his meds today except for atorvastatin which he ran out of a few days ago.   Labs (3/11): UPEP/SPEP negative, ANA negative, urine drug screen negative, HIV negative, TSH normal, LDL 158, HDL 106, TGs 200, BNP 976, ESR 3, K 4, creatinine 1.01 Labs (4/11): BNP 212, K 4.2, creatinine 0.97 Labs (7/11): BNP 81, K 4.1, creatinine 0.9 Labs (10/11): K 4.2, creatinine 0.8, LDL 98, HDL 68 Labs (1/12): K 3.8, creatinine 0.9, BNP 70 Labs (8/12): K 3.8, creatinine 0.9, HDL 90, LDL 48  ECG: NSR, nonspecific T wave inversions inferiorly  Allergies:  1)  !  Pcn  Past Medical History: 1. Nonischemic Cardiomyopathy: LHC (3/11) showed EF 15%, 40% ostial CFX, 40% prox RCA, 40-50% mid RCA.  Echo (3/11): Moderately dilated LV, EF 20-25%, global HK, severe diastolic dysfunction, mild RV dilation and dysfunction, moderate MR. SPEP/UPEP negative, ANA negative, HIV negative, TSH normal, no drug history.  Moderate ETOH use.  Highest suspicion probably viral myocarditis.   Cardiac MRI (4/11) showed a moderately dilated LV with global systolic dysfunction, EF 20%, normal RV size with moderate systolic dysfunction, small area of nonspecific subepicardial basal inferoseptal (RV insertion site) delayed enhancement.  Repeat echo (9/11): EF 40% with mild to moderate diffuse hypokinesis.  2. Nonobstructive CAD: LHC as above with mild disease.  3. Smoking 4. Hyperlipidemia  Family History: The patient is adopted so does not know his family history.   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  smoked half pack per day for over 30 years, now down to 3-4 cigs/day Alcohol Use - yes, usually liquor three times a week (3-4 drinks at a time) Married , has two children.  One is 14 and one is 20.   ROS: All systems reviewed and negative except as per HPI.   Current Outpatient Prescriptions  Medication Sig Dispense Refill  . aspirin (ASPIR-81) 81 MG EC tablet Take 81 mg by mouth daily.        Marland Kitchen  atorvastatin (LIPITOR) 20 MG tablet Take 1 tablet (20 mg total) by mouth daily.  90 tablet  3  . carvedilol (COREG) 25 MG tablet Take 1 tablet (25 mg total) by mouth 2 (two) times daily with a meal.  180 tablet  3  . isosorbide-hydrALAZINE (BIDIL) 20-37.5 MG per tablet Take 1 tablet by mouth 3 (three) times daily.  270 tablet  3  . spironolactone (ALDACTONE) 25 MG tablet Take 1 tablet (25 mg total) by mouth daily.  90 tablet  3  . enalapril (VASOTEC) 20 MG tablet Take 1 tablet (20 mg total) by mouth 2 (two) times daily.  180 tablet  3   No  current facility-administered medications for this visit.    BP 158/90  Pulse 78  Ht 5\' 8"  (1.727 m)  Wt 148 lb (67.132 kg)  BMI 22.51 kg/m2 General: NAD Neck: No JVD, no thyromegaly or thyroid nodule.  Lungs: Clear to auscultation bilaterally with normal respiratory effort. CV: Nondisplaced PMI.  Heart regular S1/S2, no S3/S4, no murmur.  No peripheral edema.  No carotid bruit.  Normal pedal pulses.  Abdomen: Soft, nontender, no hepatosplenomegaly, no distention.  Neurologic: Alert and oriented x 3.  Psych: Normal affect. Extremities: No clubbing or cyanosis.   Assessment/Plan: 1. Nonischemic cardiomyopathy: Last EF by echo was 40%.  He is not volume overloaded on exam, NYHA I-II symptoms.   - I will get an echocardiogram. If EF has returned to normal range, will likely need to stop spironolactone as his followup has been sporadic.   - For now, will continue Coreg, enalapril, spironolactone, and Bidil.   - BMET today and in 3 months. 2. HTN: I will increase enalapril to 20 mg bid today.   3. Hyperlipidemia: Refill atorvastatin, will check lipids in 2-3 months.  4. Smoking: I strongly encouraged cessation.  5. I talked to him about the need for regular labwork and followup with the medications that he is taking.  He seems to understand the need for this.   Marca Ancona 11/28/2012 12:14 PM

## 2012-12-07 ENCOUNTER — Telehealth: Payer: Self-pay | Admitting: *Deleted

## 2012-12-07 NOTE — Telephone Encounter (Signed)
[name] will call me the week of 12/11/12 to schedule echo. I will forward message to [facility] [email or web address].12/07/12. sl

## 2012-12-14 ENCOUNTER — Encounter: Payer: Self-pay | Admitting: *Deleted

## 2012-12-28 ENCOUNTER — Ambulatory Visit (HOSPITAL_COMMUNITY): Payer: BC Managed Care – PPO | Attending: Cardiology

## 2012-12-28 DIAGNOSIS — R0609 Other forms of dyspnea: Secondary | ICD-10-CM | POA: Insufficient documentation

## 2012-12-28 DIAGNOSIS — F17200 Nicotine dependence, unspecified, uncomplicated: Secondary | ICD-10-CM | POA: Insufficient documentation

## 2012-12-28 DIAGNOSIS — I5022 Chronic systolic (congestive) heart failure: Secondary | ICD-10-CM | POA: Insufficient documentation

## 2012-12-28 DIAGNOSIS — I2510 Atherosclerotic heart disease of native coronary artery without angina pectoris: Secondary | ICD-10-CM | POA: Insufficient documentation

## 2012-12-28 DIAGNOSIS — I428 Other cardiomyopathies: Secondary | ICD-10-CM | POA: Insufficient documentation

## 2012-12-28 DIAGNOSIS — R609 Edema, unspecified: Secondary | ICD-10-CM | POA: Insufficient documentation

## 2012-12-28 DIAGNOSIS — R0989 Other specified symptoms and signs involving the circulatory and respiratory systems: Secondary | ICD-10-CM | POA: Insufficient documentation

## 2012-12-28 DIAGNOSIS — E785 Hyperlipidemia, unspecified: Secondary | ICD-10-CM | POA: Insufficient documentation

## 2012-12-28 DIAGNOSIS — I509 Heart failure, unspecified: Secondary | ICD-10-CM

## 2012-12-28 NOTE — Progress Notes (Signed)
Echocardiogram performed.  

## 2013-02-26 ENCOUNTER — Other Ambulatory Visit: Payer: BC Managed Care – PPO

## 2013-02-28 ENCOUNTER — Telehealth: Payer: Self-pay | Admitting: Cardiology

## 2013-02-28 NOTE — Telephone Encounter (Signed)
Walk in Pt Form " Is it Ok to [facility]" Please Call sent to Message Nurse 02/28/13/KM

## 2013-04-06 ENCOUNTER — Telehealth: Payer: Self-pay | Admitting: *Deleted

## 2013-04-06 NOTE — Telephone Encounter (Signed)
LMTCB System says [facility] has his rx. Tried to call [facility] and could not get an Designer, television/film set for assistance.

## 2013-04-07 ENCOUNTER — Telehealth: Payer: Self-pay | Admitting: Physician Assistant

## 2013-04-07 DIAGNOSIS — I5022 Chronic systolic (congestive) heart failure: Secondary | ICD-10-CM

## 2013-04-07 MED ORDER — ATORVASTATIN CALCIUM 20 MG PO TABS: 20.0000 mg | ORAL_TABLET | Freq: Every day | ORAL | Status: DC

## 2013-04-07 NOTE — Telephone Encounter (Signed)
Patient called the answering service for a 7 day supply of his Lipitor. Rx sent to his pharmacy. Signed,  Tereso Newcomer, PA-C   04/07/2013 3:40 PM

## 2013-04-09 ENCOUNTER — Other Ambulatory Visit: Payer: Self-pay

## 2013-04-09 DIAGNOSIS — I5022 Chronic systolic (congestive) heart failure: Secondary | ICD-10-CM

## 2013-04-09 MED ORDER — ATORVASTATIN CALCIUM 20 MG PO TABS: 20.0000 mg | ORAL_TABLET | Freq: Every day | ORAL | Status: DC

## 2013-04-09 MED ORDER — CARVEDILOL 25 MG PO TABS: 25.0000 mg | ORAL_TABLET | Freq: Two times a day (BID) | ORAL | Status: DC

## 2013-04-09 MED ORDER — ENALAPRIL MALEATE 20 MG PO TABS: 20.0000 mg | ORAL_TABLET | Freq: Two times a day (BID) | ORAL | Status: DC

## 2013-04-09 MED ORDER — SPIRONOLACTONE 25 MG PO TABS: 25.0000 mg | ORAL_TABLET | Freq: Every day | ORAL | Status: DC

## 2013-04-19 ENCOUNTER — Other Ambulatory Visit: Payer: Self-pay | Admitting: Cardiology

## 2013-04-19 DIAGNOSIS — I5022 Chronic systolic (congestive) heart failure: Secondary | ICD-10-CM

## 2013-04-19 MED ORDER — ATORVASTATIN CALCIUM 20 MG PO TABS: 20.0000 mg | ORAL_TABLET | Freq: Every day | ORAL | Status: DC

## 2013-04-19 MED ORDER — SPIRONOLACTONE 25 MG PO TABS: 25.0000 mg | ORAL_TABLET | Freq: Every day | ORAL | Status: DC

## 2013-04-23 ENCOUNTER — Telehealth: Payer: Self-pay | Admitting: *Deleted

## 2013-04-23 NOTE — Telephone Encounter (Signed)
I called and spoke to pt to give him an appt and he wouldn't make one and told me he would call back. I advise pt he need to see Dr Shirlee Latch for medication refill. Just an FYI. From Erin Sons 04/23/13

## 2013-10-02 ENCOUNTER — Other Ambulatory Visit: Payer: Self-pay

## 2013-10-02 DIAGNOSIS — I5022 Chronic systolic (congestive) heart failure: Secondary | ICD-10-CM

## 2013-10-02 MED ORDER — CARVEDILOL 25 MG PO TABS: 25.0000 mg | ORAL_TABLET | Freq: Two times a day (BID) | ORAL | Status: DC

## 2013-11-07 ENCOUNTER — Ambulatory Visit (INDEPENDENT_AMBULATORY_CARE_PROVIDER_SITE_OTHER): Payer: BC Managed Care – PPO | Admitting: Cardiology

## 2013-11-07 ENCOUNTER — Encounter: Payer: Self-pay | Admitting: Cardiology

## 2013-11-07 VITALS — BP 122/64 | HR 79 | Ht 68.0 in | Wt 148.0 lb

## 2013-11-07 DIAGNOSIS — I2510 Atherosclerotic heart disease of native coronary artery without angina pectoris: Secondary | ICD-10-CM

## 2013-11-07 DIAGNOSIS — I5022 Chronic systolic (congestive) heart failure: Secondary | ICD-10-CM

## 2013-11-07 DIAGNOSIS — Z8679 Personal history of other diseases of the circulatory system: Secondary | ICD-10-CM

## 2013-11-07 DIAGNOSIS — E785 Hyperlipidemia, unspecified: Secondary | ICD-10-CM

## 2013-11-07 DIAGNOSIS — F17200 Nicotine dependence, unspecified, uncomplicated: Secondary | ICD-10-CM

## 2013-11-07 LAB — BASIC METABOLIC PANEL
BUN: 11 mg/dL (ref 6–23)
CO2: 26 mEq/L (ref 19–32)
CREATININE: 0.8 mg/dL (ref 0.4–1.5)
Calcium: 8.7 mg/dL (ref 8.4–10.5)
Chloride: 105 mEq/L (ref 96–112)
GFR: 136.16 mL/min (ref >60.00)
Glucose, Bld: 91 mg/dL (ref 70–99)
Potassium: 3.9 mEq/L (ref 3.5–5.1)
Sodium: 135 mEq/L (ref 135–145)

## 2013-11-07 LAB — LIPID PANEL
CHOL/HDL RATIO: 2
Cholesterol: 216 mg/dL — ABNORMAL HIGH (ref 0–200)
HDL: 103.7 mg/dL (ref >39.00)
LDL CALC: 89 mg/dL (ref 0–99)
TRIGLYCERIDES: 118 mg/dL (ref 0.0–149.0)
VLDL: 23.6 mg/dL (ref 0.0–40.0)

## 2013-11-07 MED ORDER — AMLODIPINE BESYLATE 2.5 MG PO TABS: 2.5000 mg | ORAL_TABLET | Freq: Every day | ORAL | Status: DC

## 2013-11-07 NOTE — Patient Instructions (Signed)
Stop spironolactone.  Start amlodipine 2.5mg  daily.   Your physician recommends that you return for lab work today--BMET/Lipid profile.  Your physician wants you to follow-up in: 1 year with Dr Shirlee Latch in the [facility]. (March 2015).  You will receive a reminder letter in the mail two months in advance. If you don't receive a letter, please call our office to schedule the follow-up appointment.

## 2013-11-08 NOTE — Progress Notes (Signed)
Patient ID: [name], male   DOB: July 09, 1961, 53 y.o.   MRN: [id number] PCP: Dr. Wynelle Link  53 yo returns for evaluation of nonischemic cardiomyopathy.  Patient presented to [facility] in 3/11 after 2-3 weeks of shortness of breath.  He had a flu-like illness with congestion and myalgias and had been treated with clarithromycin as an outpatient.  However, he became progressively short of breath so went into the hospital.  He was noted to have BNP 976 and pulmonary edema on CXR.  Echo showed EF 20-25%.  Left heart cath was done showing mild coronary disease, suggesting that this was likely a nonischemic cardiomyopathy.  Patient was diuresed and begun on cardiac meds.  Cardiac MRI showed EF 20% without significant delayed enhancement, so no evidence for infiltrative disease.  Echo was repeated 9/11, showing some improvement.  Would estimate EF at 40% with diffuse hypokinesis.  Last echo was in 4/14 and showed EF 55% with septal hypokinesis.   Patient has been doing well lately.  No exertional dyspnea or chest pain.  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  No orthopnea/PND.  He has not been taking Bidil.  He has not followed up for labs for spironolactone.  He is smoking 5-6 cigarettes/day.  He is trying to quit with e-cigarette.  He sometimes checks BP at home.  At times, it is high.   Labs (3/11): UPEP/SPEP negative, ANA negative, urine drug screen negative, HIV negative, TSH normal, LDL 158, HDL 106, TGs 200, BNP 976, ESR 3, K 4, creatinine 1.01 Labs (4/11): BNP 212, K 4.2, creatinine 0.97 Labs (7/11): BNP 81, K 4.1, creatinine 0.9 Labs (10/11): K 4.2, creatinine 0.8, LDL 98, HDL 68 Labs (1/12): K 3.8, creatinine 0.9, BNP 70 Labs (8/12): K 3.8, creatinine 0.9, HDL 90, LDL 48 Labs (3/14): K 3.5, creatinine 0.9  Allergies:  1)  ! Pcn  Past Medical History: 1. Nonischemic Cardiomyopathy: LHC (3/11) showed EF 15%, 40% ostial CFX, 40% prox RCA, 40-50% mid RCA.  Echo (3/11):  Moderately dilated LV, EF 20-25%, global HK, severe diastolic dysfunction, mild RV dilation and dysfunction, moderate MR. SPEP/UPEP negative, ANA negative, HIV negative, TSH normal, no drug history.  Moderate ETOH use.  Highest suspicion probably viral myocarditis.   Cardiac MRI (4/11) showed a moderately dilated LV with global systolic dysfunction, EF 20%, normal RV size with moderate systolic dysfunction, small area of nonspecific subepicardial basal inferoseptal (RV insertion site) delayed enhancement.  Repeat echo (9/11): EF 40% with mild to moderate diffuse hypokinesis.  Echo (4/14) with EF 55%, septal hypokinesis, mild MR.  2. Nonobstructive CAD: LHC as above with mild disease.  3. Smoking 4. Hyperlipidemia  Family History: The patient is adopted so does not know his family history.   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  smoked half pack per day for over 30 years, now down to 5-6 cigs/day Alcohol Use - yes, usually liquor three times a week (3-4 drinks at a time) Married , has two children.  One is 14 and one is 20.   ROS: All systems reviewed and negative except as per HPI.   Current Outpatient Prescriptions  Medication Sig Dispense Refill  . aspirin (ASPIR-81) 81 MG EC tablet Take 81 mg by mouth daily.        Marland Kitchen atorvastatin (LIPITOR) 20 MG tablet Take 1 tablet (20 mg total) by mouth daily.  90 tablet  1  .  carvedilol (COREG) 25 MG tablet Take 1 tablet (25 mg total) by mouth 2 (two) times daily with a meal.  28 tablet  0  . enalapril (VASOTEC) 20 MG tablet Take 1 tablet (20 mg total) by mouth 2 (two) times daily.  180 tablet  0  . amLODipine (NORVASC) 2.5 MG tablet Take 1 tablet (2.5 mg total) by mouth daily.  90 tablet  3   No current facility-administered medications for this visit.    BP 122/64  Pulse 79  Ht $R'5\' 8"'eV$  (1.727 m)  Wt 67.132 kg (148 lb)  BMI 22.51 kg/m2 General: NAD Neck: No JVD, no thyromegaly or thyroid nodule.   Lungs: Clear to auscultation bilaterally with normal respiratory effort. CV: Nondisplaced PMI.  Heart regular S1/S2, no S3/S4, no murmur.  No peripheral edema.  No carotid bruit.  Normal pedal pulses.  Abdomen: Soft, nontender, no hepatosplenomegaly, no distention.  Neurologic: Alert and oriented x 3.  Psych: Normal affect. Extremities: No clubbing or cyanosis.   Assessment/Plan: 1. Nonischemic cardiomyopathy: Last EF by echo was improved to 55%.  He is not volume overloaded on exam, NYHA I-II symptoms.   - I am going to stop spironolactone as he has not been following up for BMETs. - Will get BMET today.  - Continue Coreg and enalapril.  2. HTN: Since I am stopping spironolactone, will add amlodipine 2.5 mg daily.  This can be titrated up.   3. Hyperlipidemia: Check lipids today.   4. Smoking: I strongly encouraged cessation.   Loralie Champagne 11/08/2013 11:40 PM

## 2013-11-12 ENCOUNTER — Telehealth: Payer: Self-pay | Admitting: Cardiology

## 2013-11-12 NOTE — Telephone Encounter (Signed)
Follow Up ° °Pt returning call from earlier. Please call. °

## 2013-11-12 NOTE — Telephone Encounter (Signed)
Spoke with patient about recent lab results 

## 2014-10-22 ENCOUNTER — Other Ambulatory Visit: Payer: Self-pay

## 2014-10-22 DIAGNOSIS — I5022 Chronic systolic (congestive) heart failure: Secondary | ICD-10-CM

## 2014-10-22 MED ORDER — CARVEDILOL 25 MG PO TABS: 25.0000 mg | ORAL_TABLET | Freq: Two times a day (BID) | ORAL | Status: DC

## 2014-10-22 MED ORDER — ENALAPRIL MALEATE 20 MG PO TABS: 20.0000 mg | ORAL_TABLET | Freq: Two times a day (BID) | ORAL | Status: DC

## 2014-10-22 MED ORDER — ATORVASTATIN CALCIUM 20 MG PO TABS: 20.0000 mg | ORAL_TABLET | Freq: Every day | ORAL | Status: DC

## 2014-10-22 MED ORDER — AMLODIPINE BESYLATE 2.5 MG PO TABS: 2.5000 mg | ORAL_TABLET | Freq: Every day | ORAL | Status: DC

## 2015-01-03 ENCOUNTER — Ambulatory Visit: Payer: Self-pay | Admitting: Cardiology

## 2015-01-23 ENCOUNTER — Encounter: Payer: Self-pay | Admitting: Physician Assistant

## 2015-01-23 ENCOUNTER — Ambulatory Visit (INDEPENDENT_AMBULATORY_CARE_PROVIDER_SITE_OTHER): Payer: BLUE CROSS/BLUE SHIELD | Admitting: Physician Assistant

## 2015-01-23 VITALS — BP 138/70 | HR 71 | Ht 68.0 in | Wt 159.0 lb

## 2015-01-23 DIAGNOSIS — F17200 Nicotine dependence, unspecified, uncomplicated: Secondary | ICD-10-CM

## 2015-01-23 DIAGNOSIS — Z720 Tobacco use: Secondary | ICD-10-CM

## 2015-01-23 DIAGNOSIS — I10 Essential (primary) hypertension: Secondary | ICD-10-CM | POA: Diagnosis not present

## 2015-01-23 DIAGNOSIS — R9431 Abnormal electrocardiogram [ECG] [EKG]: Secondary | ICD-10-CM

## 2015-01-23 DIAGNOSIS — I429 Cardiomyopathy, unspecified: Secondary | ICD-10-CM

## 2015-01-23 DIAGNOSIS — I428 Other cardiomyopathies: Secondary | ICD-10-CM

## 2015-01-23 DIAGNOSIS — E785 Hyperlipidemia, unspecified: Secondary | ICD-10-CM

## 2015-01-23 NOTE — Progress Notes (Signed)
Cardiology Office Note   Date:  01/23/2015   ID:  [name], DOB 12-24-1960, MRN [id number]  PCP:  Paul Rubenstein, MD  Cardiologist:  Dr. Marca Ancona     Chief Complaint  Patient presents with  . Congestive Heart Failure     History of Present Illness: [name] is a 54 y.o. male with a hx of chronic systolic HF, nonischemic cardiomyopathy, tobacco abuse, hyperlipidemia. Patient presented to [facility] [place] in 11/2009 with 2-3 weeks of shortness of breath.  He had a flu-like illness with congestion and myalgias and had been treated with clarithromycin as an outpatient. However, he became progressively short of breath so went into the hospital. He was noted to have BNP 976 and pulmonary edema on CXR. Echo showed EF 20-25%. Left heart cath was done showing mild coronary disease, suggesting that this was likely a nonischemic cardiomyopathy. Patient was diuresed and begun on cardiac meds. Cardiac MRI showed EF 20% without significant delayed enhancement, so no evidence for infiltrative disease. Echo was repeated 9/11, showing some improvement. Would estimate EF at 40% with diffuse hypokinesis. Last echo was in 4/14 and showed EF 55% with septal hypokinesis.   Last seen by Dr. Marca Ancona 11/2013.  He returns for FU.  He is doing well.  The patient denies chest pain, shortness of breath, syncope, orthopnea, PND or significant pedal edema.   Studies/Reports Reviewed Today:  Echo 12/28/12 - Left ventricle: LVEF is approximately 55% with septalhypokinesis. The cavity size was normal. - Mitral valve: Mild regurgitation.  Cardiac MRI 12/17/09 IMPRESSION: 1. Moderately dilated LV with severe global hypokinesis, EF 20%. 2. Normal RV size with moderate systolic dysfunction. 3. Small area of subepicardial basal inferoseptal hyperenhancement at the RV insertion site. This is nonspecific.   Past Medical History  Diagnosis Date  . Nonischemic cardiomyopathy    LHC 3/11 showed EF 15%, 40% ostial CFX, 40% prox RCA, 40-50% mid RCa. Echo 3/11 moderate dilated LV, EF 20-25% global hk  . CAD (coronary artery disease)     nonobstructive: LHC as above with mild disease  . HLD (hyperlipidemia)   . Chronic systolic heart failure   . Personal history of unspecified circulatory disease   1. Nonischemic Cardiomyopathy: LHC (3/11) showed EF 15%, 40% ostial CFX, 40% prox RCA, 40-50% mid RCA. Echo (3/11): Moderately dilated LV, EF 20-25%, global HK, severe diastolic dysfunction, mild RV dilation and dysfunction, moderate MR. SPEP/UPEP negative, ANA negative, HIV negative, TSH normal, no drug history. Moderate ETOH use. Highest suspicion probably viral myocarditis. Cardiac MRI (4/11) showed a moderately dilated LV with global systolic dysfunction, EF 20%, normal RV size with moderate systolic dysfunction, small area of nonspecific subepicardial basal inferoseptal (RV insertion site) delayed enhancement. Repeat echo (9/11): EF 40% with mild to moderate diffuse hypokinesis. Echo (4/14) with EF 55%, septal hypokinesis, mild MR.  2. Nonobstructive CAD: LHC as above with mild disease.  3. Smoking 4. Hyperlipidemia   History reviewed. No pertinent past surgical history.   Current Outpatient Prescriptions  Medication Sig Dispense Refill  . amLODipine (NORVASC) 2.5 MG tablet Take 1 tablet (2.5 mg total) by mouth daily. 90 tablet 0  . aspirin (ASPIR-81) 81 MG EC tablet Take 81 mg by mouth daily.      Marland Kitchen atorvastatin (LIPITOR) 20 MG tablet Take 1 tablet (20 mg total) by mouth daily. 90 tablet 0  . carvedilol (COREG) 25 MG tablet Take 1 tablet (25 mg total) by mouth 2 (two) times daily with  a meal. 180 tablet 0  . enalapril (VASOTEC) 20 MG tablet Take 1 tablet (20 mg total) by mouth 2 (two) times daily. 180 tablet 0   No current facility-administered medications for this visit.    Allergies:   Penicillins    Social History:  The patient  reports that he has been  smoking Cigarettes.  He does not have any smokeless tobacco history on file. He reports that he drinks alcohol.   Family History:  The patient's He was adopted. Family history is unknown by patient.    ROS:   Please see the history of present illness.   Review of Systems  HENT: Positive for congestion.   All other systems reviewed and are negative.     PHYSICAL EXAM: VS:  BP 138/70 mmHg  Pulse 71  Ht 5\' 8"  (1.727 m)  Wt 159 lb (72.122 kg)  BMI 24.18 kg/m2    Wt Readings from Last 3 Encounters:  01/23/15 159 lb (72.122 kg)  11/07/13 148 lb (67.132 kg)  11/27/12 148 lb (67.132 kg)     GEN: Well nourished, well developed, in no acute distress HEENT: normal Neck: no JVD,  no masses Cardiac:  Normal S1/S2, RRR; no murmur ,  no rubs or gallops, no edema  Respiratory:  clear to auscultation bilaterally, no wheezing, rhonchi or rales. GI: soft, nontender, nondistended, + BS MS: no deformity or atrophy Skin: warm and dry  Neuro:  CNs II-XII intact, Strength and sensation are intact Psych: Normal affect   EKG:  EKG is ordered today.  It demonstrates:   NSR, HR 71, normal axis, TWI V1-V5, LVH   Recent Labs: No results found for requested labs within last 365 days.    Lipid Panel    Component Value Date/Time   CHOL 216* 11/07/2013 1547   TRIG 118.0 11/07/2013 1547   HDL 103.70 11/07/2013 1547   CHOLHDL 2 11/07/2013 1547   VLDL 23.6 11/07/2013 1547   LDLCALC 89 11/07/2013 1547   LDLDIRECT 47.7 04/12/2011 0855      ASSESSMENT AND PLAN:  NICM (nonischemic cardiomyopathy):  Last echo with normal LVF.  He remains NYHA 2.  No evidence of volume excess.  His ECG demonstrates ant-lat TWI.  This is different from prior ECG.  Looks like it might be LVH with repol abnormality.  He denies chest pain.  I will get an Echo to recheck LVF.  Continue beta-blocker, ACE inhibitor.  CAD:  Non-obstructive by prior cath.  No angina. ECG looks different.  I will get an echo as noted.  If  he has any symptoms of CP, dyspnea, fatigue, etc, he will call.  Would get a stress test at that point.  Continue ASA, statin, beta-blocker.  Essential hypertension:  Controlled. Arrange BMET.   Hyperlipidemia:  LDL 3/15 was 89.  Arrange FLP, LFTs.  SMOKER:  I advised him to quit.    Current medicines are reviewed at length with the patient today.  Concerns regarding medicines are as outlined above.  The following changes have been made:    None    Labs/ tests ordered today include:  Orders Placed This Encounter  Procedures  . Basic Metabolic Panel (BMET)  . Hepatic function panel  . Lipid Profile  . EKG 12-Lead  . Echocardiogram    Disposition:   FU with Dr. Marca Ancona 3-4 mos.    Signed, Brynda Rim, MHS 01/23/2015 5:58 PM    [facility] [address] [place],  [place], [place]  [address] Phone: [phone]; Fax: [phone]

## 2015-01-23 NOTE — Patient Instructions (Signed)
Medication Instructions:  Your physician recommends that you continue on your current medications as directed. Please refer to the Current Medication list given to you today.   Labwork: FASTING LIPID AND LIVER PANEL AND BMET ; THIS CAN BE DONE WHEN YOU HAVE YOUR ECHO  Testing/Procedures: Your physician has requested that you have an echocardiogram. Echocardiography is a painless test that uses sound waves to create images of your heart. It provides your doctor with information about the size and shape of your heart and how well your heart's chambers and valves are working. This procedure takes approximately one hour. There are no restrictions for this procedure.   Follow-Up: 3-4 MONTHS WITH DR. Shirlee Latch  Any Other Special Instructions Will Be Listed Below (If Applicable).

## 2015-01-30 ENCOUNTER — Ambulatory Visit (HOSPITAL_COMMUNITY): Payer: BLUE CROSS/BLUE SHIELD | Attending: Physician Assistant

## 2015-01-30 ENCOUNTER — Other Ambulatory Visit: Payer: Self-pay

## 2015-01-30 ENCOUNTER — Other Ambulatory Visit (INDEPENDENT_AMBULATORY_CARE_PROVIDER_SITE_OTHER): Payer: BLUE CROSS/BLUE SHIELD | Admitting: *Deleted

## 2015-01-30 DIAGNOSIS — E785 Hyperlipidemia, unspecified: Secondary | ICD-10-CM

## 2015-01-30 DIAGNOSIS — I428 Other cardiomyopathies: Secondary | ICD-10-CM

## 2015-01-30 DIAGNOSIS — I10 Essential (primary) hypertension: Secondary | ICD-10-CM | POA: Diagnosis not present

## 2015-01-30 DIAGNOSIS — I429 Cardiomyopathy, unspecified: Secondary | ICD-10-CM

## 2015-01-30 LAB — LIPID PANEL
CHOL/HDL RATIO: 2
CHOLESTEROL: 185 mg/dL (ref 0–200)
HDL: 77.4 mg/dL (ref >39.00)
LDL Cholesterol: 85 mg/dL (ref 0–99)
NonHDL: 107.6
TRIGLYCERIDES: 112 mg/dL (ref 0.0–149.0)
VLDL: 22.4 mg/dL (ref 0.0–40.0)

## 2015-01-30 LAB — HEPATIC FUNCTION PANEL
ALT: 14 U/L (ref 0–53)
AST: 15 U/L (ref 0–37)
Albumin: 3.5 g/dL (ref 3.5–5.2)
Alkaline Phosphatase: 49 U/L (ref 39–117)
BILIRUBIN DIRECT: 0.1 mg/dL (ref 0.0–0.3)
TOTAL PROTEIN: 5.8 g/dL — AB (ref 6.0–8.3)
Total Bilirubin: 0.5 mg/dL (ref 0.2–1.2)

## 2015-01-30 LAB — BASIC METABOLIC PANEL
BUN: 12 mg/dL (ref 6–23)
CO2: 29 mEq/L (ref 19–32)
CREATININE: 0.81 mg/dL (ref 0.40–1.50)
Calcium: 8.7 mg/dL (ref 8.4–10.5)
Chloride: 106 mEq/L (ref 96–112)
GFR: 127.83 mL/min (ref >60.00)
GLUCOSE: 105 mg/dL — AB (ref 70–99)
Potassium: 4.8 mEq/L (ref 3.5–5.1)
Sodium: 137 mEq/L (ref 135–145)

## 2015-01-31 ENCOUNTER — Encounter: Payer: Self-pay | Admitting: Physician Assistant

## 2015-01-31 ENCOUNTER — Telehealth: Payer: Self-pay | Admitting: *Deleted

## 2015-01-31 DIAGNOSIS — I428 Other cardiomyopathies: Secondary | ICD-10-CM

## 2015-01-31 NOTE — Telephone Encounter (Signed)
lmptcb on both cell and home # to go over results

## 2015-02-04 NOTE — Telephone Encounter (Signed)
pt notified of lab and echo results with verbal understanding to all results given today. Repeat echo in 1 yr.

## 2015-02-04 NOTE — Telephone Encounter (Signed)
Pt rtn your call to go over results-pls call [phone]

## 2015-05-26 ENCOUNTER — Telehealth: Payer: Self-pay | Admitting: Cardiology

## 2015-05-26 NOTE — Telephone Encounter (Signed)
LMTCB

## 2015-05-26 NOTE — Telephone Encounter (Signed)
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

## 2015-05-27 NOTE — Telephone Encounter (Signed)
LMTCB

## 2015-05-30 ENCOUNTER — Ambulatory Visit: Payer: BLUE CROSS/BLUE SHIELD | Admitting: Cardiology

## 2015-08-25 ENCOUNTER — Emergency Department (HOSPITAL_COMMUNITY)
Admission: EM | Admit: 2015-08-25 | Discharge: 2015-08-25 | Disposition: A | Discharge status: Home / Self Care | Payer: BLUE CROSS/BLUE SHIELD | Attending: Emergency Medicine | Admitting: Emergency Medicine

## 2015-08-25 ENCOUNTER — Encounter (HOSPITAL_COMMUNITY): Payer: Self-pay | Admitting: Emergency Medicine

## 2015-08-25 ENCOUNTER — Emergency Department (HOSPITAL_COMMUNITY): Payer: BLUE CROSS/BLUE SHIELD

## 2015-08-25 DIAGNOSIS — I5022 Chronic systolic (congestive) heart failure: Secondary | ICD-10-CM | POA: Diagnosis not present

## 2015-08-25 DIAGNOSIS — Z880 Allergy status to penicillin: Secondary | ICD-10-CM | POA: Insufficient documentation

## 2015-08-25 DIAGNOSIS — I2510 Atherosclerotic heart disease of native coronary artery without angina pectoris: Secondary | ICD-10-CM | POA: Insufficient documentation

## 2015-08-25 DIAGNOSIS — Z7982 Long term (current) use of aspirin: Secondary | ICD-10-CM | POA: Insufficient documentation

## 2015-08-25 DIAGNOSIS — E785 Hyperlipidemia, unspecified: Secondary | ICD-10-CM | POA: Insufficient documentation

## 2015-08-25 DIAGNOSIS — Z79899 Other long term (current) drug therapy: Secondary | ICD-10-CM | POA: Insufficient documentation

## 2015-08-25 DIAGNOSIS — F17210 Nicotine dependence, cigarettes, uncomplicated: Secondary | ICD-10-CM | POA: Insufficient documentation

## 2015-08-25 DIAGNOSIS — R42 Dizziness and giddiness: Secondary | ICD-10-CM | POA: Diagnosis not present

## 2015-08-25 LAB — URINALYSIS, ROUTINE W REFLEX MICROSCOPIC
BILIRUBIN URINE: NEGATIVE
Glucose, UA: NEGATIVE mg/dL
HGB URINE DIPSTICK: NEGATIVE
KETONES UR: NEGATIVE mg/dL
Leukocytes, UA: NEGATIVE
Nitrite: NEGATIVE
PH: 8 (ref 5.0–8.0)
Protein, ur: NEGATIVE mg/dL
SPECIFIC GRAVITY, URINE: 1.021 (ref 1.005–1.030)

## 2015-08-25 LAB — CBC
HCT: 37.2 % — ABNORMAL LOW (ref 39.0–52.0)
Hemoglobin: 12 g/dL — ABNORMAL LOW (ref 13.0–17.0)
MCH: 27.3 pg (ref 26.0–34.0)
MCHC: 32.3 g/dL (ref 30.0–36.0)
MCV: 84.5 fL (ref 78.0–100.0)
Platelets: 214 10*3/uL (ref 150–400)
RBC: 4.4 MIL/uL (ref 4.22–5.81)
RDW: 14.8 % (ref 11.5–15.5)
WBC: 5.9 10*3/uL (ref 4.0–10.5)

## 2015-08-25 LAB — I-STAT TROPONIN, ED: Troponin i, poc: 0 ng/mL (ref 0.00–0.08)

## 2015-08-25 LAB — BASIC METABOLIC PANEL
ANION GAP: 8 (ref 5–15)
BUN: 7 mg/dL (ref 6–20)
CALCIUM: 8.1 mg/dL — AB (ref 8.9–10.3)
CO2: 25 mmol/L (ref 22–32)
Chloride: 106 mmol/L (ref 101–111)
Creatinine, Ser: 0.86 mg/dL (ref 0.61–1.24)
GFR calc Af Amer: >60 mL/min (ref >60)
GLUCOSE: 112 mg/dL — AB (ref 65–99)
POTASSIUM: 3.9 mmol/L (ref 3.5–5.1)
Sodium: 139 mmol/L (ref 135–145)

## 2015-08-25 NOTE — Discharge Instructions (Signed)
Please read and follow all provided instructions.  Your diagnoses today include:  1. Lightheadedness    Tests performed today include:  An EKG of your heart - unchanged from previous, no sign of heart attack  A chest x-ray  Cardiac enzymes - a blood test for heart muscle damage, no sign of stress on heart  Blood counts and electrolytes  Vital signs. See below for your results today.   Medications prescribed:   None  Take any prescribed medications only as directed.  Follow-up instructions: Please follow-up with your primary care provider as soon as you can for further evaluation of your symptoms.   Return instructions:  SEEK IMMEDIATE MEDICAL ATTENTION IF:  You have severe chest pain, especially if the pain is crushing or pressure-like and spreads to the arms, back, neck, or jaw, or if you have sweating, nausea (feeling sick to your stomach), or shortness of breath. THIS IS AN EMERGENCY. Don't wait to see if the pain will go away. Get medical help at once. Call 911 or 0 (operator). DO NOT drive yourself to the hospital.   Your chest pain gets worse and does not go away with rest.   You have an attack of chest pain lasting longer than usual, despite rest and treatment with the medications your caregiver has prescribed.   You wake from sleep with chest pain or shortness of breath.  You feel worsening severe dizziness or faint.  You have chest pain not typical of your usual pain for which you originally saw your caregiver.   You have any other emergent concerns regarding your health.  Your vital signs today were: BP 123/77 mmHg   Pulse 89   Temp(Src) 97.8 F (36.6 C) (Oral)   Resp 18   Ht 5\' 8"  (1.727 m)   Wt 70.308 kg   BMI 23.57 kg/m2   SpO2 99% If your blood pressure (BP) was elevated above 135/85 this visit, please have this repeated by your doctor within one month. --------------

## 2015-08-25 NOTE — ED Notes (Addendum)
Per patient was at work tonight and just didn't feel well.  Reports feeling dizzy since around midnight.  Denies any nausea or vomiting.  Has a hx of CHF.  Reports swelling to lower ext yesterday but none noted today.

## 2015-08-25 NOTE — ED Provider Notes (Signed)
CSN: [id number]     Arrival date & time 08/25/15  0406 History   First MD Initiated Contact with Patient 08/25/15 0601     Chief Complaint  Patient presents with  . Dizziness   (Consider location/radiation/quality/duration/timing/severity/associated sxs/prior Treatment) HPI Comments: Patient with history of congestive heart failure secondary to nonischemic cardiomyopathy, last catheterization in 2011 with nonobstructive disease -- presents with complaint of dizziness described as a lightheadedness. Patient was at work last night and at approximately 12:30 AM, developed sensation of lightheadedness. He did not have full syncope. Patient continued to work. He had several episodes not associated with shortness of breath, chest pain. Patient returned home and had another episode so he decided to come to the emergency department given his history of heart problems. He denies any recent extremity swelling, increasing orthopnea, decreased exercise tolerance. He denies any blood in stools or dark stools. No nausea, vomiting, or diarrhea. No urinary symptoms. No treatments prior to arrival. Onset of symptoms acute. Course is resolved. Nothing makes symptoms better or worse.  Patient is a 54 y.o. male presenting with dizziness. The history is provided by the patient.  Dizziness Associated symptoms: no chest pain, no diarrhea, no headaches, no nausea and no vomiting     Past Medical History  Diagnosis Date  . Nonischemic cardiomyopathy (HCC)     LHC 3/11 showed EF 15%, 40% ostial CFX, 40% prox RCA, 40-50% mid RCa. Echo 3/11 moderate dilated LV, EF 20-25% global hk  . CAD (coronary artery disease)     nonobstructive: LHC as above with mild disease  . HLD (hyperlipidemia)   . Chronic systolic heart failure (HCC)   . Personal history of unspecified circulatory disease   . History of echocardiogram     Echo 5/16:  EF 50-55%, diff HK, Gr 1 DD, mod MR, LA upper limits normal, mobile atrial septum (cannot  r/p PFO)   History reviewed. No pertinent past surgical history. Family History  Problem Relation Age of Onset  . Adopted: Yes  . Family history unknown: Yes   Social History  Substance Use Topics  . Smoking status: Current Every Day Smoker    Types: Cigarettes  . Smokeless tobacco: None     Comment: smoked 1/2 ppd for over 30 years, now down to 3-4 cigs/day  . Alcohol Use: Yes     Comment: usu liquor three times a week (3-4 drinks at a time)     Review of Systems  Constitutional: Negative for fever.  HENT: Negative for congestion, dental problem, rhinorrhea, sinus pressure and sore throat.   Eyes: Negative for photophobia, discharge, redness and visual disturbance.  Respiratory: Negative for cough.   Cardiovascular: Negative for chest pain.  Gastrointestinal: Negative for nausea, vomiting, abdominal pain and diarrhea.  Genitourinary: Negative for dysuria.  Musculoskeletal: Negative for myalgias, gait problem, neck pain and neck stiffness.  Skin: Negative for rash.  Neurological: Positive for light-headedness. Negative for syncope, speech difficulty, numbness and headaches.  Psychiatric/Behavioral: Negative for confusion.    Allergies  Penicillins  Home Medications   Prior to Admission medications   Medication Sig Start Date End Date Taking? Authorizing Provider  amLODipine (NORVASC) 2.5 MG tablet Take 1 tablet (2.5 mg total) by mouth daily. 10/22/14  Yes Laurey Morale, MD  aspirin (ASPIR-81) 81 MG EC tablet Take 81 mg by mouth daily.     Yes Historical Provider, MD  atorvastatin (LIPITOR) 20 MG tablet Take 1 tablet (20 mg total) by mouth daily. 10/22/14  Yes  Laurey Morale, MD  carvedilol (COREG) 25 MG tablet Take 1 tablet (25 mg total) by mouth 2 (two) times daily with a meal. 10/22/14  Yes Laurey Morale, MD  enalapril (VASOTEC) 20 MG tablet Take 1 tablet (20 mg total) by mouth 2 (two) times daily. 10/22/14  Yes Laurey Morale, MD   BP 117/68 mmHg  Pulse 90   Temp(Src) 97.8 F (36.6 C) (Oral)  Resp 16  Ht 5\' 8"  (1.727 m)  Wt 70.308 kg  BMI 23.57 kg/m2  SpO2 100%   Physical Exam  Constitutional: He is oriented to person, place, and time. He appears well-developed and well-nourished.  HENT:  Head: Normocephalic and atraumatic.  Mouth/Throat: Mucous membranes are normal. Mucous membranes are not dry.  Eyes: Conjunctivae are normal.  No nystagmus.  Neck: Trachea normal and normal range of motion. Neck supple. Normal carotid pulses and no JVD present. No muscular tenderness present. Carotid bruit is not present. No tracheal deviation present.  Cardiovascular: Normal rate, regular rhythm, S1 normal, S2 normal, normal heart sounds and intact distal pulses.  Exam reveals no distant heart sounds and no decreased pulses.   No murmur heard. Pulmonary/Chest: Effort normal and breath sounds normal. No respiratory distress. He has no wheezes. He exhibits no tenderness.  Abdominal: Soft. Normal aorta and bowel sounds are normal. There is no tenderness. There is no rebound and no guarding.  Musculoskeletal: He exhibits no edema.  No lower extremity edema.  Neurological: He is alert and oriented to person, place, and time.  Skin: Skin is warm and dry. He is not diaphoretic. No cyanosis. No pallor.  Psychiatric: He has a normal mood and affect.  Nursing note and vitals reviewed.   ED Course  Procedures (including critical care time) Labs Review Labs Reviewed  BASIC METABOLIC PANEL - Abnormal; Notable for the following:    Glucose, Bld 112 (*)    Calcium 8.1 (*)    All other components within normal limits  CBC - Abnormal; Notable for the following:    Hemoglobin 12.0 (*)    HCT 37.2 (*)    All other components within normal limits  URINALYSIS, ROUTINE W REFLEX MICROSCOPIC (NOT AT [facility])  Rosezena Sensor, ED    Imaging Review Dg Chest 2 View  08/25/2015  CLINICAL DATA:  Dizziness beginning the night of 08/24/2015. Initial encounter. EXAM:  CHEST  2 VIEW COMPARISON:  PA and lateral chest 11/16/2009. FINDINGS: The lungs are clear. Heart size is normal. There is no pneumothorax or pleural effusion. No focal bony abnormality. IMPRESSION: No acute disease. Electronically Signed   By: Drusilla Kanner M.D.   On: 08/25/2015 07:16   I have personally reviewed and evaluated these images and lab results as part of my medical decision-making.   EKG Interpretation None      6:17 AM Patient seen and examined. Work-up initiated.   Vital signs reviewed and are as follows: BP 117/68 mmHg  Pulse 90  Temp(Src) 97.8 F (36.6 C) (Oral)  Resp 16  Ht 5\' 8"  (1.727 m)  Wt 70.308 kg  BMI 23.57 kg/m2  SpO2 100%  Very mild anemia noted on CBC. BMP is unremarkable. EKG reviewed, unchanged no ischemic changes. Do not suspect uncontrolled heart failure. We will check orthostatic vital signs, chest x-ray, troponin. Suspect discharged home with cardiology/PCP follow-up if these are negative.  7:54 AM Patient continues to feel well. We discussed workup. Patient wants to go home. Encouraged PCP follow-up and cardiology follow-up this week.  Patient was counseled to return with severe chest pain, especially if the pain is crushing or pressure-like and spreads to the arms, back, neck, or jaw, or if they have sweating, nausea, or shortness of breath with the pain. They were encouraged to call 911 with these symptoms.   They were also told to return if their chest pain gets worse and does not go away with rest, they have an attack of chest pain lasting longer than usual despite rest and treatment with the medications their caregiver has prescribed, if they wake from sleep with chest pain or shortness of breath, if they feel worsening dizziness or faint, if they have chest pain not typical of their usual pain, or if they have any other emergent concerns regarding their health.  The patient verbalized understanding and agreed.    MDM   Final diagnoses:   Lightheadedness   Patient with h/o CHF presents with c/o nonspecific dizziness is from his lightheadedness. No full syncope. No chest pain or shortness of breath. Workup here is unremarkable. EKG is unchanged and nonischemic. Troponin is negative. Chest x-ray is clear. No objective findings of decompensated heart failure on exam. Normal orthostatics. Patient is feeling better. At this point, no indications which would necessitate admission at this time. Patient seems to be reliable to return if symptoms worsen. Encouraged f/u with PCP and cardiologist this week.    Renne Crigler, PA-C 08/25/15 1610  Derwood Kaplan, MD 08/25/15 [phone]

## 2015-09-30 ENCOUNTER — Telehealth: Payer: Self-pay | Admitting: Cardiology

## 2015-09-30 NOTE — Telephone Encounter (Signed)
New Message  Pt c/o medication issue:  1. Name of Medication:   carvedilol (COREG) 25 MG tablet amLODipine (NORVASC) 2.5 MG tablet atorvastatin (LIPITOR) 20 MG tablet enalapril (VASOTEC) 20 MG tablet   4. What is your medication issue? [employer] are requesting the scripts through the service provided through [facility]

## 2015-10-01 NOTE — Telephone Encounter (Signed)
Left message on voicemail for patient to call the office as I need to clarify the pharmacy and also to let him know that he needs to schedule an appointment.

## 2015-10-06 ENCOUNTER — Other Ambulatory Visit: Payer: Self-pay | Admitting: Cardiology

## 2015-10-06 DIAGNOSIS — I5022 Chronic systolic (congestive) heart failure: Secondary | ICD-10-CM

## 2015-10-06 MED ORDER — ENALAPRIL MALEATE 20 MG PO TABS: 20.0000 mg | ORAL_TABLET | Freq: Two times a day (BID) | ORAL | Status: DC

## 2015-10-06 MED ORDER — CARVEDILOL 25 MG PO TABS: 25.0000 mg | ORAL_TABLET | Freq: Two times a day (BID) | ORAL | Status: DC

## 2015-10-07 ENCOUNTER — Telehealth: Payer: Self-pay | Admitting: Cardiology

## 2015-10-07 DIAGNOSIS — I5022 Chronic systolic (congestive) heart failure: Secondary | ICD-10-CM

## 2015-10-07 MED ORDER — AMLODIPINE BESYLATE 2.5 MG PO TABS: 2.5000 mg | ORAL_TABLET | Freq: Every day | ORAL | Status: DC

## 2015-10-07 MED ORDER — ENALAPRIL MALEATE 20 MG PO TABS: 20.0000 mg | ORAL_TABLET | Freq: Two times a day (BID) | ORAL | Status: DC

## 2015-10-07 MED ORDER — ATORVASTATIN CALCIUM 20 MG PO TABS: 20.0000 mg | ORAL_TABLET | Freq: Every day | ORAL | Status: DC

## 2015-10-07 MED ORDER — CARVEDILOL 25 MG PO TABS: 25.0000 mg | ORAL_TABLET | Freq: Two times a day (BID) | ORAL | Status: DC

## 2015-10-07 NOTE — Telephone Encounter (Signed)
New message      *STAT* If patient is at the pharmacy, call can be transferred to refill team.   1. Which medications need to be refilled? (please list name of each medication and dose if known) carvedilol 25mg , amlodipine 2.5mg , atorvastatin 20mg , enalapril 20mg  2. Which pharmacy/location (including street and city if local pharmacy) is medication to be sent to?[facility]  3. Do they need a 30 day or 90 day supply? 90 day supply

## 2015-10-07 NOTE — Telephone Encounter (Signed)
Pt's Rx was sent to pt's pharmacy as requested. Confirmation received.  °

## 2015-10-09 NOTE — Progress Notes (Signed)
Cardiology Office Note:    Date:  10/10/2015   ID:  [name], DOB 18-Feb-1961, MRN [id number]  PCP:  Leanor Rubenstein, MD  Cardiologist:  Dr. Marca Ancona   Electrophysiologist:  n/a  Chief Complaint  Patient presents with  . Cardiomyopathy    follow-up    History of Present Illness:     [name] is a 55 y.o. male with a hx of chronic systolic HF, nonischemic cardiomyopathy, tobacco abuse, hyperlipidemia. Patient presented to [facility] in 3/11 with 2-3 weeks of shortness of breath. He had a flu-like illness with congestion and myalgias and had been treated with clarithromycin as an outpatient. However, he became progressively short of breath so went into the hospital. He was noted to have BNP 976 and pulmonary edema on CXR. Echo showed EF 20-25%. Left heart cath was done showing mild coronary disease, suggesting that this was likely a nonischemic cardiomyopathy. Patient was diuresed and begun on cardiac meds. Cardiac MRI showed EF 20% without significant delayed enhancement, so no evidence for infiltrative disease. Echo was repeated 9/11, showing some improvement. Would estimate EF at 40% with diffuse hypokinesis. Last echo was in 4/14 and showed EF 55% with septal hypokinesis.   Last seen 5/16 by me.  Follow-up echo demonstrated normal LV function, mild diastolic dysfunction and moderate mitral regurgitation. He returns for follow-up.  He is doing well.  The patient denies chest pain, shortness of breath, syncope, orthopnea, PND or significant pedal edema.    Past Medical History  Diagnosis Date  . Nonischemic cardiomyopathy (HCC)     LHC 3/11 showed EF 15%, 40% ostial CFX, 40% prox RCA, 40-50% mid RCa. Echo 3/11 moderate dilated LV, EF 20-25% global hk  . CAD (coronary artery disease)     nonobstructive: LHC as above with mild disease  . HLD (hyperlipidemia)   . Chronic systolic heart failure (HCC)   . Personal history of unspecified circulatory  disease   . History of echocardiogram     Echo 5/16:  EF 50-55%, diff HK, Gr 1 DD, mod MR, LA upper limits normal, mobile atrial septum (cannot r/p PFO)  1. Nonischemic Cardiomyopathy: LHC (3/11) showed EF 15%, 40% ostial CFX, 40% prox RCA, 40-50% mid RCA. Echo (3/11): Moderately dilated LV, EF 20-25%, global HK, severe diastolic dysfunction, mild RV dilation and dysfunction, moderate MR. SPEP/UPEP negative, ANA negative, HIV negative, TSH normal, no drug history. Moderate ETOH use. Highest suspicion probably viral myocarditis. Cardiac MRI (4/11) showed a moderately dilated LV with global systolic dysfunction, EF 20%, normal RV size with moderate systolic dysfunction, small area of nonspecific subepicardial basal inferoseptal (RV insertion site) delayed enhancement. Repeat echo (9/11): EF 40% with mild to moderate diffuse hypokinesis. Echo (4/14) with EF 55%, septal hypokinesis, mild MR. Echo 5/16 EF 50-55%, diffuse HK, grade 1 diastolic dysfunction, moderate MR, LA upper limits of normal 2. Nonobstructive CAD: LHC as above with mild disease.  3. Smoking 4. Hyperlipidemia  No past surgical history on file.  Current Medications: Outpatient Prescriptions Prior to Visit  Medication Sig Dispense Refill  . aspirin (ASPIR-81) 81 MG EC tablet Take 81 mg by mouth daily.      Marland Kitchen atorvastatin (LIPITOR) 20 MG tablet Take 1 tablet (20 mg total) by mouth daily. 90 tablet 0  . carvedilol (COREG) 25 MG tablet Take 1 tablet (25 mg total) by mouth 2 (two) times daily with a meal. Please keep upcoming appointment for future refills. Thank you 180 tablet 0  .  enalapril (VASOTEC) 20 MG tablet Take 1 tablet (20 mg total) by mouth 2 (two) times daily. 180 tablet 0  . amLODipine (NORVASC) 2.5 MG tablet Take 1 tablet (2.5 mg total) by mouth daily. 90 tablet 0   No facility-administered medications prior to visit.     Allergies:   Penicillins   Social History   Social History  . Marital Status: Married     Spouse Name: N/A  . Number of Children: 2  . Years of Education: N/A   Occupational History  .     Social History Main Topics  . Smoking status: Current Every Day Smoker    Types: Cigarettes  . Smokeless tobacco: None     Comment: smoked 1/2 ppd for over 30 years, now down to 3-4 cigs/day  . Alcohol Use: Yes     Comment: usu liquor three times a week (3-4 drinks at a time)   . Drug Use: None  . Sexual Activity: Not Asked   Other Topics Concern  . None   Social History Narrative   Works Office manager at [employer].      Family History:  The patient's He was adopted. Family history is unknown by patient.   ROS:   Please see the history of present illness.    ROS All other systems reviewed and are negative.   Physical Exam:    VS:  BP 138/70 mmHg  Pulse 72  Ht 5\' 8"  (1.727 m)  Wt 157 lb 12.8 oz (71.578 kg)  BMI 24.00 kg/m2   GEN: Well nourished, well developed, in no acute distress HEENT: normal Neck: no JVD, no masses Cardiac: Normal S1/S2, RRR; no murmurs, no edema;  no carotid bruits,   Respiratory:  clear to auscultation bilaterally; no wheezing, rhonchi or rales GI: soft, nontender MS: no deformity or atrophy Skin: warm and dry, no rash Neuro:  no focal deficits  Psych: Alert and oriented x 3, normal affect  Wt Readings from Last 3 Encounters:  10/10/15 157 lb 12.8 oz (71.578 kg)  08/25/15 155 lb (70.308 kg)  01/23/15 159 lb (72.122 kg)      Studies/Labs Reviewed:     EKG:  EKG is  ordered today.  The ekg ordered today demonstrates NSR, HR 71, normal axis, LVH, NSSTTW changes, similar to prior tracings. QTc 434 ms  Recent Labs: 01/30/2015: ALT 14 08/25/2015: BUN 7; Creatinine, Ser 0.86; Hemoglobin 12.0*; Platelets 214; Potassium 3.9; Sodium 139   Recent Lipid Panel    Component Value Date/Time   CHOL 185 01/30/2015 0816   TRIG 112.0 01/30/2015 0816   HDL 77.40 01/30/2015 0816   CHOLHDL 2 01/30/2015 0816   VLDL 22.4 01/30/2015 0816   LDLCALC 85  01/30/2015 0816   LDLDIRECT 47.7 04/12/2011 0855    Additional studies/ records that were reviewed today include:   Echo 01/30/15 EF 50-55%, diffuse HK, grade 1 diastolic dysfunction, moderate MR, LA upper limits of normal  Echo 12/28/12 Left ventricle: LVEF is approximately 55% with septalhypokinesis. The cavity size was normal. Mild MR  Cardiac MRI 12/17/09 IMPRESSION: 1.Moderately dilated LV with severe global hypokinesis, EF 20%. 2. Normal RV size with moderate systolic dysfunction. 3. Small area of subepicardial basal inferoseptal hyperenhancement at the RV insertion site. This is nonspecific.   ASSESSMENT:     1. Chronic systolic CHF (congestive heart failure) (HCC)   2. Coronary artery disease involving native coronary artery of native heart without angina pectoris   3. Essential hypertension   4.  Hyperlipidemia   5. SMOKER   6. Mitral regurgitation   7. Chronic systolic heart failure (HCC)   8. Chronic systolic congestive heart failure (HCC)     PLAN:     In order of problems listed above:  1. Chronic Systolic CHF - NICM.  Echo 5/16 continued to demonstrate normal LVF. He remains NYHA 1-2. No evidence of volume excess.  Continue beta-blocker, ACE inhibitor.  FU with me or Dr. Marca Ancona in 6 mos.   2. CAD - Non-obstructive by prior cath. No angina.  Continue ASA, statin, beta-blocker.  3. Essential hypertension - Borderline control.  Continue current dose of beta-blocker, ACE inhibitor.  Increase Norvasc to 5 mg QD.    4. Hyperlipidemia: LDL 5/16 was 85. Continue statin.   5. SMOKER - I advised him to quit. I spent 3 minutes counseling him on cessation today. We discussed different options including setting a quit date, Chantix, nicotine patches and E cigarettes.    6. Mitral regurgitation - Plan repeat Echo in 6 mos.    Medication Adjustments/Labs and Tests Ordered: Current medicines are reviewed at length with the patient today.  Concerns regarding  medicines are outlined above.  Medication changes, Labs and Tests ordered today are outlined in the Patient Instructions noted below. Patient Instructions  Medication Instructions:  Increase Norvasc (Amlodipine) to 5 mg Once daily - you can start this when your prescription is renewed the next time.  Labwork: None today  Testing/Procedures: Your physician has requested that you have an echocardiogram THIS IS TO BE DONE ABOUT 1 WEEK BEFORE APPT WITH DR. Gerda Diss Elwin Tsou, PAC IN 6 MONTHS. Echocardiography is a painless test that uses sound waves to create images of your heart. It provides your doctor with information about the size and shape of your heart and how well your heart's chambers and valves are working. This procedure takes approximately one hour. There are no restrictions for this procedure.  Follow-Up: Dr. Marca Ancona or Tereso Newcomer, PA-C in 6 months.   Any Other Special Instructions Will Be Listed Below (If Applicable).  Call us back later today with the name of the mail order so we can call in your prescriptions.    Call 1-800-QUIT-NOW [phone])373-9523) for help with quitting smoking.     Signed, Tereso Newcomer, PA-C  10/10/2015 10:38 AM    [facility] [address] [place], [place], [place]  [address] Phone: [phone]; Fax: [phone]

## 2015-10-10 ENCOUNTER — Encounter: Payer: Self-pay | Admitting: Physician Assistant

## 2015-10-10 ENCOUNTER — Ambulatory Visit (INDEPENDENT_AMBULATORY_CARE_PROVIDER_SITE_OTHER): Payer: BLUE CROSS/BLUE SHIELD | Admitting: Physician Assistant

## 2015-10-10 VITALS — BP 138/70 | HR 72 | Ht 68.0 in | Wt 157.8 lb

## 2015-10-10 DIAGNOSIS — I2510 Atherosclerotic heart disease of native coronary artery without angina pectoris: Secondary | ICD-10-CM

## 2015-10-10 DIAGNOSIS — I10 Essential (primary) hypertension: Secondary | ICD-10-CM | POA: Diagnosis not present

## 2015-10-10 DIAGNOSIS — E785 Hyperlipidemia, unspecified: Secondary | ICD-10-CM | POA: Insufficient documentation

## 2015-10-10 DIAGNOSIS — I340 Nonrheumatic mitral (valve) insufficiency: Secondary | ICD-10-CM

## 2015-10-10 DIAGNOSIS — I5022 Chronic systolic (congestive) heart failure: Secondary | ICD-10-CM | POA: Diagnosis not present

## 2015-10-10 DIAGNOSIS — F17200 Nicotine dependence, unspecified, uncomplicated: Secondary | ICD-10-CM

## 2015-10-10 MED ORDER — AMLODIPINE BESYLATE 5 MG PO TABS: 5.0000 mg | ORAL_TABLET | Freq: Every day | ORAL | Status: DC

## 2015-10-10 NOTE — Patient Instructions (Addendum)
Medication Instructions:  Increase Norvasc (Amlodipine) to 5 mg Once daily - you can start this when your prescription is renewed the next time.  Labwork: None today  Testing/Procedures: Your physician has requested that you have an echocardiogram THIS IS TO BE DONE ABOUT 1 WEEK BEFORE APPT WITH DR. Gerda Diss Latacha Texeira, PAC IN 6 MONTHS. Echocardiography is a painless test that uses sound waves to create images of your heart. It provides your doctor with information about the size and shape of your heart and how well your heart's chambers and valves are working. This procedure takes approximately one hour. There are no restrictions for this procedure.  Follow-Up: Dr. Marca Ancona or Tereso Newcomer, PA-C in 6 months.   Any Other Special Instructions Will Be Listed Below (If Applicable).  Call us back later today with the name of the mail order so we can call in your prescriptions.    Call 1-800-QUIT-NOW (7857163674) for help with quitting smoking.

## 2015-10-21 ENCOUNTER — Telehealth: Payer: Self-pay | Admitting: Cardiology

## 2015-10-21 ENCOUNTER — Other Ambulatory Visit (HOSPITAL_COMMUNITY): Payer: Self-pay | Admitting: *Deleted

## 2015-10-21 DIAGNOSIS — I5022 Chronic systolic (congestive) heart failure: Secondary | ICD-10-CM

## 2015-10-21 MED ORDER — CARVEDILOL 25 MG PO TABS: 25.0000 mg | ORAL_TABLET | Freq: Two times a day (BID) | ORAL | Status: DC

## 2015-10-21 MED ORDER — AMLODIPINE BESYLATE 5 MG PO TABS: 5.0000 mg | ORAL_TABLET | Freq: Every day | ORAL | Status: DC

## 2015-10-21 MED ORDER — ATORVASTATIN CALCIUM 20 MG PO TABS: 20.0000 mg | ORAL_TABLET | Freq: Every day | ORAL | Status: DC

## 2015-10-21 NOTE — Telephone Encounter (Signed)
New message     *STAT* If patient is at the pharmacy, call can be transferred to refill team.   1. Which medications need to be refilled? (please list name of each medication and dose if known) amlodipine 5 mg / asaprinin  81 mg / atorvastatin 20 mg/ carvedilol  25 mg / enalapril  20 mg   2. Which pharmacy/location (including street and city if local pharmacy) is medication to be sent to? [facility] [phone]  Fax # [phone]  3. Do they need a 30 day or 90 day supply? 90 days

## 2015-11-19 ENCOUNTER — Other Ambulatory Visit: Payer: Self-pay | Admitting: *Deleted

## 2015-11-19 MED ORDER — ENALAPRIL MALEATE 20 MG PO TABS: 20.0000 mg | ORAL_TABLET | Freq: Two times a day (BID) | ORAL | Status: DC

## 2016-03-24 ENCOUNTER — Ambulatory Visit (HOSPITAL_COMMUNITY): Payer: BLUE CROSS/BLUE SHIELD | Attending: Cardiovascular Disease

## 2016-03-24 ENCOUNTER — Other Ambulatory Visit: Payer: Self-pay

## 2016-03-24 ENCOUNTER — Encounter (INDEPENDENT_AMBULATORY_CARE_PROVIDER_SITE_OTHER): Payer: Self-pay

## 2016-03-24 DIAGNOSIS — I428 Other cardiomyopathies: Secondary | ICD-10-CM | POA: Insufficient documentation

## 2016-03-24 DIAGNOSIS — I340 Nonrheumatic mitral (valve) insufficiency: Secondary | ICD-10-CM | POA: Diagnosis not present

## 2016-03-24 DIAGNOSIS — E785 Hyperlipidemia, unspecified: Secondary | ICD-10-CM | POA: Diagnosis not present

## 2016-03-24 DIAGNOSIS — I2510 Atherosclerotic heart disease of native coronary artery without angina pectoris: Secondary | ICD-10-CM | POA: Insufficient documentation

## 2016-03-24 DIAGNOSIS — I059 Rheumatic mitral valve disease, unspecified: Secondary | ICD-10-CM | POA: Diagnosis present

## 2016-03-25 ENCOUNTER — Telehealth: Payer: Self-pay | Admitting: *Deleted

## 2016-03-25 NOTE — Telephone Encounter (Signed)
Pt notified of echo results and findings by phone with verbal understanding. Asked pt if he was taking his medications. Pt states he forgets often to take meds or he gets busy. Pt agreeable to CHF appt w/Dr. Shirlee Latch appt. Will have CHF call w/appt

## 2016-03-25 NOTE — Telephone Encounter (Signed)
Follow Up:; ° ° °Returning your call. °

## 2016-03-25 NOTE — Telephone Encounter (Signed)
Lmtcb on both home and cell #'s to go over echo results and recommendations.

## 2016-03-30 ENCOUNTER — Ambulatory Visit (HOSPITAL_COMMUNITY)
Admission: RE | Admit: 2016-03-30 | Discharge: 2016-03-30 | Disposition: A | Discharge status: Home / Self Care | Payer: BLUE CROSS/BLUE SHIELD | Source: Ambulatory Visit | Attending: Cardiology | Admitting: Cardiology

## 2016-03-30 ENCOUNTER — Telehealth (HOSPITAL_COMMUNITY): Payer: Self-pay | Admitting: Vascular Surgery

## 2016-03-30 VITALS — BP 116/62 | HR 87 | Wt 145.5 lb

## 2016-03-30 DIAGNOSIS — Z880 Allergy status to penicillin: Secondary | ICD-10-CM | POA: Insufficient documentation

## 2016-03-30 DIAGNOSIS — Z79899 Other long term (current) drug therapy: Secondary | ICD-10-CM | POA: Diagnosis not present

## 2016-03-30 DIAGNOSIS — I5022 Chronic systolic (congestive) heart failure: Secondary | ICD-10-CM | POA: Diagnosis not present

## 2016-03-30 DIAGNOSIS — E785 Hyperlipidemia, unspecified: Secondary | ICD-10-CM

## 2016-03-30 DIAGNOSIS — I2510 Atherosclerotic heart disease of native coronary artery without angina pectoris: Secondary | ICD-10-CM | POA: Diagnosis not present

## 2016-03-30 DIAGNOSIS — I428 Other cardiomyopathies: Secondary | ICD-10-CM | POA: Diagnosis not present

## 2016-03-30 DIAGNOSIS — Z7982 Long term (current) use of aspirin: Secondary | ICD-10-CM | POA: Diagnosis not present

## 2016-03-30 DIAGNOSIS — F17210 Nicotine dependence, cigarettes, uncomplicated: Secondary | ICD-10-CM | POA: Insufficient documentation

## 2016-03-30 DIAGNOSIS — I110 Hypertensive heart disease with heart failure: Secondary | ICD-10-CM | POA: Diagnosis not present

## 2016-03-30 LAB — BASIC METABOLIC PANEL
Anion gap: 7 (ref 5–15)
BUN: 5 mg/dL — AB (ref 6–20)
CALCIUM: 8.7 mg/dL — AB (ref 8.9–10.3)
CHLORIDE: 109 mmol/L (ref 101–111)
CO2: 24 mmol/L (ref 22–32)
CREATININE: 0.7 mg/dL (ref 0.61–1.24)
GFR calc Af Amer: >60 mL/min (ref >60)
GFR calc non Af Amer: >60 mL/min (ref >60)
Glucose, Bld: 103 mg/dL — ABNORMAL HIGH (ref 65–99)
Potassium: 3.7 mmol/L (ref 3.5–5.1)
SODIUM: 140 mmol/L (ref 135–145)

## 2016-03-30 LAB — LIPID PANEL
CHOL/HDL RATIO: 1.5 ratio
Cholesterol: 216 mg/dL — ABNORMAL HIGH (ref 0–200)
HDL: 146 mg/dL (ref >40)
LDL Cholesterol: 55 mg/dL (ref 0–99)
Triglycerides: 76 mg/dL (ref <150)
VLDL: 15 mg/dL (ref 0–40)

## 2016-03-30 LAB — BRAIN NATRIURETIC PEPTIDE: B Natriuretic Peptide: 24.4 pg/mL (ref 0.0–100.0)

## 2016-03-30 MED ORDER — SPIRONOLACTONE 25 MG PO TABS: 12.5000 mg | ORAL_TABLET | Freq: Every day | ORAL | 3 refills | Status: DC

## 2016-03-30 NOTE — Telephone Encounter (Signed)
Left mess for pt rx was sent to [facility]

## 2016-03-30 NOTE — Telephone Encounter (Signed)
Left pt message to make 1 week lab appt

## 2016-03-30 NOTE — Patient Instructions (Signed)
Start Spironolactone 12.5 mg (1/2 tab) daily  Labs today  Labs in 1 week  Your physician has requested that you have en exercise stress myoview. For further information please visit [email or web address] Please follow instruction sheet, as given.  Your physician recommends that you schedule a follow-up appointment in: 3 weeks

## 2016-03-30 NOTE — Telephone Encounter (Signed)
Pt called .Marland Kitchen He states he his medication vis not @ [facility] . He would like for his medication to be [facility] to [facility] [address]

## 2016-03-31 ENCOUNTER — Telehealth (HOSPITAL_COMMUNITY): Payer: Self-pay | Admitting: *Deleted

## 2016-03-31 NOTE — Telephone Encounter (Signed)
Left message on voicemail in reference to upcoming appointment scheduled for 7./28/17 Phone number given for a call back so details instructions can be given.  [name]

## 2016-03-31 NOTE — Progress Notes (Signed)
Patient ID: [name], male   DOB: Feb 28, 1961, 55 y.o.   MRN: [id number] PCP: Dr. Nancy Hawkins  55 yo returns for evaluation of nonischemic cardiomyopathy.  Patient presented to [facility] in 3/11 after 2-3 weeks of shortness of breath.  He had a flu-like illness with congestion and myalgias and had been treated with clarithromycin as an outpatient.  However, he became progressively short of breath so went into the hospital.  He was noted to have BNP 976 and pulmonary edema on CXR.  Echo showed EF 20-25%.  Left heart cath was done showing mild coronary disease, suggesting that this was likely a nonischemic cardiomyopathy.  Patient was diuresed and begun on cardiac meds.  Cardiac MRI showed EF 20% without significant delayed enhancement, so no evidence for infiltrative disease.  Echo was repeated 9/11, showing some improvement.  Would estimate EF at 40% with diffuse hypokinesis.  Echo in 4/14 showed EF 55% with septal hypokinesis.  5/16 echo also showed EF 50-55% range.   Patient was recently seen by [name].  He ordered an echo, which showed EF back down to 30-35%.  Mr [name] says that he has been taking his cardiac meds but misses them a lot.  Estimates about 50% compliance.  Compliance has been near 100% per his report since the echo was done.  He is working as a Presenter, broadcasting at [employer].  Drinking 3-4 beers 4 times a week.  No exertional dyspnea but notes increased fatigue over the last few months.  BP is not elevated today.  Continues to smoke 5 cigs/day.  No orthopnea/PND.  No chest pain.  No palpitations/lightheadedness/syncope.    ECG (2/17): NSR, early repolarization pattern  Labs (3/11): UPEP/SPEP negative, ANA negative, urine drug screen negative, HIV negative, TSH normal, LDL 158, HDL 106, TGs 200, BNP 976, ESR 3, K 4, creatinine 1.01 Labs (4/11): BNP 212, K 4.2, creatinine 0.97 Labs (7/11): BNP 81, K 4.1, creatinine 0.9 Labs (10/11): K 4.2, creatinine 0.8, LDL 98, HDL 68 Labs  (1/12): K 3.8, creatinine 0.9, BNP 70 Labs (8/12): K 3.8, creatinine 0.9, HDL 90, LDL 48 Labs (3/14): K 3.5, creatinine 0.9 Labs (5/16): LDL 85 Labs (12/16): K 3.9, creatinine 0.86  Allergies:  1)  ! Pcn  Past Medical History: 1. Nonischemic Cardiomyopathy: LHC (3/11) showed EF 15%, 40% ostial CFX, 40% prox RCA, 40-50% mid RCA.  Echo (3/11): Moderately dilated LV, EF 20-25%, global HK, severe diastolic dysfunction, mild RV dilation and dysfunction, moderate MR. SPEP/UPEP negative, ANA negative, HIV negative, TSH normal, no drug history.  Moderate ETOH use.  Highest suspicion probably viral myocarditis.   Cardiac MRI (4/11) showed a moderately dilated LV with global systolic dysfunction, EF 61%, normal RV size with moderate systolic dysfunction, small area of nonspecific subepicardial basal inferoseptal (RV insertion site) delayed enhancement.  Repeat echo (9/11): EF 40% with mild to moderate diffuse hypokinesis.  Echo (4/14) with EF 55%, septal hypokinesis, mild MR.  - Echo (5/16): EF 50-55%, moderate MR. - Echo (7/17): EF 30-35%, mild MR.   2. Nonobstructive CAD: LHC as above with mild disease.  3. Smoking 4. Hyperlipidemia  Family History: The patient is adopted so does not know his family history.   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  smoked half pack per day for over 30 years, now down to 5-6 cigs/day Alcohol Use - yes, 3-4 drinks about 4 times a week. Married, has two children.    ROS:  All systems reviewed and negative except as per HPI.   Current Outpatient Prescriptions  Medication Sig Dispense Refill  . amLODipine (NORVASC) 5 MG tablet Take 1 tablet (5 mg total) by mouth daily. 90 tablet 3  . aspirin (ASPIR-81) 81 MG EC tablet Take 81 mg by mouth daily.      Marland Kitchen atorvastatin (LIPITOR) 20 MG tablet Take 1 tablet (20 mg total) by mouth daily. 90 tablet 0  . carvedilol (COREG) 25 MG tablet Take 1 tablet (25 mg total) by mouth 2  (two) times daily with a meal. Please keep upcoming appointment for future refills. Thank you 180 tablet 0  . enalapril (VASOTEC) 20 MG tablet Take 1 tablet (20 mg total) by mouth 2 (two) times daily. 180 tablet 2  . spironolactone (ALDACTONE) 25 MG tablet Take 0.5 tablets (12.5 mg total) by mouth daily. 15 tablet 3   No current facility-administered medications for this encounter.     BP 116/62   Pulse 87   Wt 145 lb 8 oz (66 kg)   SpO2 99%   BMI 22.12 kg/m  General: NAD Neck: No JVD, no thyromegaly or thyroid nodule.  Lungs: Clear to auscultation bilaterally with normal respiratory effort. CV: Nondisplaced PMI.  Heart regular S1/S2, no S3/S4, no murmur.  No peripheral edema.  No carotid bruit.  Normal pedal pulses.  Abdomen: Soft, nontender, no hepatosplenomegaly, no distention.  Neurologic: Alert and oriented x 3.  Psych: Normal affect. Extremities: No clubbing or cyanosis.   Assessment/Plan: 1. Chronic systolic CHF: History of nonischemic cardiomyopathy, had recovered to near normal on 2016 echo but now EF down to 30-35%.  No chest pain or suggestion of ischemia.  He is not volume overloaded on exam, NYHA I-II symptoms (primarily has some fatigue).  He had not been particularly compliant with his cardiac meds prior to this last echo, and he had been drinking somewhat more than in the past.    - Cut back on ETOH.  - Will arrange for ETT-Cardiolite.  If there are perfusion defects, will need coronary angiography. Suspect worsening nonischemic cardiomyopathy with med noncompliance and increased ETOH, but had mild CAD on cardiac cath in past and continues to smoke.  - Continue current Coreg and enalapril, will add spironolactone 12.5 mg daily.  BMET in 1 week. Next step would be Bidil, can replace amlodipine with Bidil.  2. HTN: BP controlled.    3. Hyperlipidemia: Check lipids today.   4. Smoking: I again strongly encouraged cessation.   Followup in 3 wks.   [name] 03/31/2016

## 2016-04-02 ENCOUNTER — Encounter (INDEPENDENT_AMBULATORY_CARE_PROVIDER_SITE_OTHER): Payer: BLUE CROSS/BLUE SHIELD

## 2016-04-02 DIAGNOSIS — R0989 Other specified symptoms and signs involving the circulatory and respiratory systems: Secondary | ICD-10-CM

## 2016-04-09 ENCOUNTER — Other Ambulatory Visit (HOSPITAL_COMMUNITY): Payer: BLUE CROSS/BLUE SHIELD

## 2016-04-14 ENCOUNTER — Telehealth (HOSPITAL_COMMUNITY): Payer: Self-pay | Admitting: *Deleted

## 2016-04-14 ENCOUNTER — Encounter (HOSPITAL_COMMUNITY): Payer: Self-pay | Admitting: *Deleted

## 2016-04-14 NOTE — Telephone Encounter (Signed)
Pt no showed for his myoview on 04/02/16, mailed letter to pt to call and resch

## 2016-04-19 ENCOUNTER — Telehealth (HOSPITAL_COMMUNITY): Payer: Self-pay | Admitting: *Deleted

## 2016-04-19 ENCOUNTER — Encounter (HOSPITAL_COMMUNITY): Payer: Self-pay

## 2016-04-19 ENCOUNTER — Ambulatory Visit (HOSPITAL_COMMUNITY)
Admission: RE | Admit: 2016-04-19 | Discharge: 2016-04-19 | Disposition: A | Discharge status: Home / Self Care | Payer: BLUE CROSS/BLUE SHIELD | Source: Ambulatory Visit | Attending: Cardiology | Admitting: Cardiology

## 2016-04-19 VITALS — BP 140/82 | HR 74 | Wt 145.8 lb

## 2016-04-19 DIAGNOSIS — F17210 Nicotine dependence, cigarettes, uncomplicated: Secondary | ICD-10-CM | POA: Diagnosis not present

## 2016-04-19 DIAGNOSIS — I10 Essential (primary) hypertension: Secondary | ICD-10-CM | POA: Diagnosis not present

## 2016-04-19 DIAGNOSIS — I110 Hypertensive heart disease with heart failure: Secondary | ICD-10-CM | POA: Insufficient documentation

## 2016-04-19 DIAGNOSIS — F17200 Nicotine dependence, unspecified, uncomplicated: Secondary | ICD-10-CM | POA: Diagnosis not present

## 2016-04-19 DIAGNOSIS — I5022 Chronic systolic (congestive) heart failure: Secondary | ICD-10-CM

## 2016-04-19 DIAGNOSIS — Z79899 Other long term (current) drug therapy: Secondary | ICD-10-CM | POA: Insufficient documentation

## 2016-04-19 DIAGNOSIS — E785 Hyperlipidemia, unspecified: Secondary | ICD-10-CM

## 2016-04-19 DIAGNOSIS — Z7982 Long term (current) use of aspirin: Secondary | ICD-10-CM | POA: Diagnosis not present

## 2016-04-19 LAB — BASIC METABOLIC PANEL
Anion gap: 7 (ref 5–15)
BUN: 11 mg/dL (ref 6–20)
CALCIUM: 8.8 mg/dL — AB (ref 8.9–10.3)
CHLORIDE: 106 mmol/L (ref 101–111)
CO2: 24 mmol/L (ref 22–32)
Creatinine, Ser: 0.83 mg/dL (ref 0.61–1.24)
GFR calc Af Amer: >60 mL/min (ref >60)
GFR calc non Af Amer: >60 mL/min (ref >60)
GLUCOSE: 107 mg/dL — AB (ref 65–99)
Potassium: 3.3 mmol/L — ABNORMAL LOW (ref 3.5–5.1)
SODIUM: 137 mmol/L (ref 135–145)

## 2016-04-19 MED ORDER — ISOSORB DINITRATE-HYDRALAZINE 20-37.5 MG PO TABS: 1.0000 | ORAL_TABLET | Freq: Three times a day (TID) | ORAL | 3 refills | Status: DC

## 2016-04-19 NOTE — Telephone Encounter (Signed)
Patient given detailed instructions per Myocardial Perfusion Study Information Sheet for the test on 04/21/16 at 0715. Patient notified to arrive 15 minutes early and that it is imperative to arrive on time for appointment to keep from having the test rescheduled.  If you need to cancel or reschedule your appointment, please call the office within 24 hours of your appointment. Failure to do so may result in a cancellation of your appointment, and a $50 no show fee. Patient verbalized understanding.Elvena Oyer, Adelene Idler

## 2016-04-19 NOTE — Progress Notes (Signed)
Patient ID: [name], male   DOB: 16-Jan-1961, 55 y.o.   MRN: [id number] PCP: Dr. Wynelle Link  55 yo returns for evaluation of nonischemic cardiomyopathy.  Patient presented to [facility] in 3/11 after 2-3 weeks of shortness of breath.  He had a flu-like illness with congestion and myalgias and had been treated with clarithromycin as an outpatient.  However, he became progressively short of breath so went into the hospital.  He was noted to have BNP 976 and pulmonary edema on CXR.  Echo showed EF 20-25%.  Left heart cath was done showing mild coronary disease, suggesting that this was likely a nonischemic cardiomyopathy.  Patient was diuresed and begun on cardiac meds.  Cardiac MRI showed EF 20% without significant delayed enhancement, so no evidence for infiltrative disease.  Echo was repeated 9/11, showing some improvement.  Would estimate EF at 40% with diffuse hypokinesis.  Echo in 4/14 showed EF 55% with septal hypokinesis.  5/16 echo also showed EF 50-55% range.   Echo in 7/17 showed EF back down to 30-35%.  He had not been taking his medications regularly (thinks he was missing about 50% of doses).  He is working as a Electrical engineer at [employer].  No exertional dyspnea but notes increased fatigue over the last few months.  Continues to smoke 5 cigs/day.  No orthopnea/PND.  No chest pain.  No palpitations/lightheadedness/syncope.  Since last appointment, he has cut back on ETOH intake.   ECG (2/17): NSR, early repolarization pattern  Labs (3/11): UPEP/SPEP negative, ANA negative, urine drug screen negative, HIV negative, TSH normal, LDL 158, HDL 106, TGs 200, BNP 976, ESR 3, K 4, creatinine 1.01 Labs (4/11): BNP 212, K 4.2, creatinine 0.97 Labs (7/11): BNP 81, K 4.1, creatinine 0.9 Labs (10/11): K 4.2, creatinine 0.8, LDL 98, HDL 68 Labs (1/12): K 3.8, creatinine 0.9, BNP 70 Labs (8/12): K 3.8, creatinine 0.9, HDL 90, LDL 48 Labs (3/14): K 3.5, creatinine 0.9 Labs (5/16): LDL 85 Labs  (12/16): K 3.9, creatinine 0.86 Labs (7/17): LDL 55, HDL 146, BNP 24, K 3.7, creatinine 0.7  Allergies:  1)  ! Pcn  Past Medical History: 1. Nonischemic Cardiomyopathy: LHC (3/11) showed EF 15%, 40% ostial CFX, 40% prox RCA, 40-50% mid RCA.  Echo (3/11): Moderately dilated LV, EF 20-25%, global HK, severe diastolic dysfunction, mild RV dilation and dysfunction, moderate MR. SPEP/UPEP negative, ANA negative, HIV negative, TSH normal, no drug history.  Moderate ETOH use.  Highest suspicion probably viral myocarditis.   Cardiac MRI (4/11) showed a moderately dilated LV with global systolic dysfunction, EF 20%, normal RV size with moderate systolic dysfunction, small area of nonspecific subepicardial basal inferoseptal (RV insertion site) delayed enhancement.  Repeat echo (9/11): EF 40% with mild to moderate diffuse hypokinesis.  Echo (4/14) with EF 55%, septal hypokinesis, mild MR.  - Echo (5/16): EF 50-55%, moderate MR. - Echo (7/17): EF 30-35%, mild MR.   2. Nonobstructive CAD: LHC as above with mild disease.  3. Smoking 4. Hyperlipidemia  Family History: The patient is adopted so does not know his family history.   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  smoked half pack per day for over 30 years, now down to 5-6 cigs/day Alcohol Use - yes, 3-4 drinks about 4 times a week. Married, has two children.    ROS: All systems reviewed and negative except as per HPI.   Current Outpatient Prescriptions  Medication Sig Dispense Refill  .  amLODipine (NORVASC) 5 MG tablet Take 1 tablet (5 mg total) by mouth daily. 90 tablet 3  . aspirin (ASPIR-81) 81 MG EC tablet Take 81 mg by mouth daily.      Marland Kitchen atorvastatin (LIPITOR) 20 MG tablet Take 1 tablet (20 mg total) by mouth daily. 90 tablet 0  . carvedilol (COREG) 25 MG tablet Take 1 tablet (25 mg total) by mouth 2 (two) times daily with a meal. Please keep upcoming appointment for future refills. Thank you  180 tablet 0  . enalapril (VASOTEC) 20 MG tablet Take 1 tablet (20 mg total) by mouth 2 (two) times daily. 180 tablet 2  . spironolactone (ALDACTONE) 25 MG tablet Take 0.5 tablets (12.5 mg total) by mouth daily. 15 tablet 3  . isosorbide-hydrALAZINE (BIDIL) 20-37.5 MG tablet Take 1 tablet by mouth 3 (three) times daily. 90 tablet 3   No current facility-administered medications for this encounter.     BP 140/82   Pulse 74   Wt 145 lb 12 oz (66.1 kg)   SpO2 100%   BMI 22.16 kg/m  General: NAD Neck: No JVD, no thyromegaly or thyroid nodule.  Lungs: Clear to auscultation bilaterally with normal respiratory effort. CV: Nondisplaced PMI.  Heart regular S1/S2, no S3/S4, no murmur.  No peripheral edema.  No carotid bruit.  Normal pedal pulses.  Abdomen: Soft, nontender, no hepatosplenomegaly, no distention.  Neurologic: Alert and oriented x 3.  Psych: Normal affect. Extremities: No clubbing or cyanosis.   Assessment/Plan: 1. Chronic systolic CHF: History of nonischemic cardiomyopathy, had recovered to near normal on 2016 echo but now EF down to 30-35% on 7/17 echo.  No chest pain or suggestion of ischemia.  He is not volume overloaded on exam, NYHA I-II symptoms (primarily has some fatigue).  He had not been particularly compliant with his cardiac meds prior to this last echo, and he had been drinking somewhat more than in the past.   He has now cut back on ETOH and is taking all his meds.  - Still waiting for ETT-Cardiolite, scheduled for this week.  If there are perfusion defects, will need coronary angiography. Suspect worsening nonischemic cardiomyopathy with med noncompliance and increased ETOH, but had mild CAD on cardiac cath in past and continues to smoke.  - Continue current Coreg and enalapril, and spironolactone.  BMET today.  - Add Bidil 1 tab tid.  If BP starts to run low, stop amlodipine.  - Repeat echo at 6 months (1/18) after advancing medical therapy.  Narrow QRS, so would not  be CRT candidate.  2. HTN: BP borderline elevated today, adding Bidil.    3. Hyperlipidemia: Good lipids in 7/17.    4. Smoking: I again strongly encouraged cessation.   Followup in 4 wks.   Loralie Champagne 04/19/2016

## 2016-04-19 NOTE — Patient Instructions (Signed)
Start Bidil 1 tablet three times daily.  Routine lab work today. Will notify you of abnormal results (bmet)  Follow up with Dr.McLean in 1 month

## 2016-04-20 ENCOUNTER — Telehealth (HOSPITAL_COMMUNITY): Payer: Self-pay

## 2016-04-20 ENCOUNTER — Telehealth (HOSPITAL_COMMUNITY): Payer: Self-pay | Admitting: *Deleted

## 2016-04-20 DIAGNOSIS — I5022 Chronic systolic (congestive) heart failure: Secondary | ICD-10-CM

## 2016-04-20 MED ORDER — SPIRONOLACTONE 25 MG PO TABS: 25.0000 mg | ORAL_TABLET | Freq: Every day | ORAL | 3 refills | Status: DC

## 2016-04-20 NOTE — Telephone Encounter (Signed)
Follow up  Pt expressed he's returning a phone call.  Please follow up with pt. Thanks!

## 2016-04-20 NOTE — Telephone Encounter (Signed)
-----   Message from Laurey Morale, MD sent at 04/19/2016 10:42 PM EDT ----- Increase spironolactone to 25 mg daily given low K and repeat BMET 1 week.

## 2016-04-20 NOTE — Telephone Encounter (Signed)
Encounter complete. 

## 2016-04-20 NOTE — Telephone Encounter (Signed)
Notes Recorded by Noralee Space, RN on 04/20/2016 at 4:45 PM EDT Patient aware. Agreeable and verbalizes understanding, he will come for labs 8/28

## 2016-04-21 ENCOUNTER — Encounter (HOSPITAL_COMMUNITY): Payer: BLUE CROSS/BLUE SHIELD

## 2016-04-21 ENCOUNTER — Encounter (HOSPITAL_COMMUNITY): Payer: Self-pay | Admitting: *Deleted

## 2016-04-21 ENCOUNTER — Ambulatory Visit (HOSPITAL_COMMUNITY)
Admission: RE | Admit: 2016-04-21 | Discharge: 2016-04-21 | Disposition: A | Discharge status: Home / Self Care | Payer: BLUE CROSS/BLUE SHIELD | Source: Ambulatory Visit | Attending: Cardiovascular Disease | Admitting: Cardiovascular Disease

## 2016-04-21 DIAGNOSIS — I110 Hypertensive heart disease with heart failure: Secondary | ICD-10-CM | POA: Diagnosis not present

## 2016-04-21 DIAGNOSIS — R9439 Abnormal result of other cardiovascular function study: Secondary | ICD-10-CM | POA: Diagnosis not present

## 2016-04-21 DIAGNOSIS — Z720 Tobacco use: Secondary | ICD-10-CM | POA: Diagnosis not present

## 2016-04-21 DIAGNOSIS — I2510 Atherosclerotic heart disease of native coronary artery without angina pectoris: Secondary | ICD-10-CM | POA: Diagnosis not present

## 2016-04-21 DIAGNOSIS — R5383 Other fatigue: Secondary | ICD-10-CM | POA: Diagnosis not present

## 2016-04-21 DIAGNOSIS — I5022 Chronic systolic (congestive) heart failure: Secondary | ICD-10-CM | POA: Diagnosis not present

## 2016-04-21 LAB — MYOCARDIAL PERFUSION IMAGING
CHL CUP MPHR: 166 {beats}/min
CHL CUP NUCLEAR SSS: 6
CSEPED: 5 min
CSEPEDS: 43 s
CSEPHR: 100 %
CSEPPHR: 166 {beats}/min
Estimated workload: 7 METS
LVDIAVOL: 138 mL (ref 62–150)
LVSYSVOL: 88 mL
RPE: 17
Rest HR: 75 {beats}/min
SDS: 2
SRS: 4
TID: 1.19

## 2016-04-21 MED ORDER — TECHNETIUM TC 99M TETROFOSMIN IV KIT
10.2000 | PACK | Freq: Once | INTRAVENOUS | Status: AC | PRN
  Administered 2016-04-21: 10 via INTRAVENOUS
  Filled 2016-04-21: qty 10

## 2016-04-21 MED ORDER — TECHNETIUM TC 99M TETROFOSMIN IV KIT
30.8000 | PACK | Freq: Once | INTRAVENOUS | Status: AC | PRN
  Administered 2016-04-21: 30.8 via INTRAVENOUS
  Filled 2016-04-21: qty 31

## 2016-04-21 NOTE — Progress Notes (Signed)
Dr Royann Shivers reviewed Myoview study and EKG's. Pt ok d/c home per Dr C. Patient's rhythm post imaging was back to baseline on resting EKG.

## 2016-05-21 ENCOUNTER — Other Ambulatory Visit (HOSPITAL_COMMUNITY): Payer: Self-pay

## 2016-05-21 ENCOUNTER — Ambulatory Visit (HOSPITAL_COMMUNITY)
Admission: RE | Admit: 2016-05-21 | Discharge: 2016-05-21 | Disposition: A | Discharge status: Home / Self Care | Payer: BLUE CROSS/BLUE SHIELD | Source: Ambulatory Visit | Attending: Cardiology | Admitting: Cardiology

## 2016-05-21 ENCOUNTER — Encounter (HOSPITAL_COMMUNITY): Payer: Self-pay

## 2016-05-21 VITALS — BP 135/75 | HR 84 | Resp 18 | Wt 141.5 lb

## 2016-05-21 DIAGNOSIS — I5022 Chronic systolic (congestive) heart failure: Secondary | ICD-10-CM | POA: Insufficient documentation

## 2016-05-21 DIAGNOSIS — R9439 Abnormal result of other cardiovascular function study: Secondary | ICD-10-CM | POA: Diagnosis not present

## 2016-05-21 DIAGNOSIS — R0600 Dyspnea, unspecified: Secondary | ICD-10-CM

## 2016-05-21 LAB — PROTIME-INR
INR: 1.05
PROTHROMBIN TIME: 13.7 s (ref 11.4–15.2)

## 2016-05-21 LAB — COMPREHENSIVE METABOLIC PANEL
ALT: 28 U/L (ref 17–63)
ANION GAP: 9 (ref 5–15)
AST: 39 U/L (ref 15–41)
Albumin: 3.6 g/dL (ref 3.5–5.0)
Alkaline Phosphatase: 60 U/L (ref 38–126)
BUN: 16 mg/dL (ref 6–20)
CALCIUM: 8.8 mg/dL — AB (ref 8.9–10.3)
CO2: 21 mmol/L — AB (ref 22–32)
Chloride: 107 mmol/L (ref 101–111)
Creatinine, Ser: 0.85 mg/dL (ref 0.61–1.24)
Glucose, Bld: 137 mg/dL — ABNORMAL HIGH (ref 65–99)
Potassium: 3.9 mmol/L (ref 3.5–5.1)
SODIUM: 137 mmol/L (ref 135–145)
Total Bilirubin: 1 mg/dL (ref 0.3–1.2)
Total Protein: 6.6 g/dL (ref 6.5–8.1)

## 2016-05-21 LAB — CBC
HCT: 36.9 % — ABNORMAL LOW (ref 39.0–52.0)
HEMOGLOBIN: 11.8 g/dL — AB (ref 13.0–17.0)
MCH: 28.3 pg (ref 26.0–34.0)
MCHC: 32 g/dL (ref 30.0–36.0)
MCV: 88.5 fL (ref 78.0–100.0)
PLATELETS: 230 10*3/uL (ref 150–400)
RBC: 4.17 MIL/uL — AB (ref 4.22–5.81)
RDW: 14.2 % (ref 11.5–15.5)
WBC: 3.8 10*3/uL — AB (ref 4.0–10.5)

## 2016-05-21 MED ORDER — ISOSORB DINITRATE-HYDRALAZINE 20-37.5 MG PO TABS: 1.5000 | ORAL_TABLET | Freq: Three times a day (TID) | ORAL | 3 refills | Status: DC

## 2016-05-21 MED ORDER — AMLODIPINE BESYLATE 5 MG PO TABS: 5.0000 mg | ORAL_TABLET | Freq: Every day | ORAL | 3 refills | Status: DC

## 2016-05-21 MED ORDER — ATORVASTATIN CALCIUM 20 MG PO TABS: 20.0000 mg | ORAL_TABLET | Freq: Every day | ORAL | 3 refills | Status: DC

## 2016-05-21 MED ORDER — ENALAPRIL MALEATE 20 MG PO TABS: 20.0000 mg | ORAL_TABLET | Freq: Two times a day (BID) | ORAL | 3 refills | Status: DC

## 2016-05-21 MED ORDER — CARVEDILOL 25 MG PO TABS: 25.0000 mg | ORAL_TABLET | Freq: Two times a day (BID) | ORAL | 3 refills | Status: DC

## 2016-05-21 MED ORDER — SPIRONOLACTONE 25 MG PO TABS: 25.0000 mg | ORAL_TABLET | Freq: Every day | ORAL | 3 refills | Status: DC

## 2016-05-21 NOTE — Progress Notes (Signed)
Cardiac cath instructions reviewed with patient 

## 2016-05-21 NOTE — Patient Instructions (Signed)
You have been scheduled for a heart catheterization next week with Dr. Shirlee Latch. See instruction sheet for additional details.  Routine lab work today. Will notify you of abnormal results, otherwise no news is good news!  INCREASE Bidil to 1.5 tabs three times daily.  All Rx's sent to [facility] for 90 day supply with refills.  Follow up 3 weeks with Dr. Shirlee Latch.  Do the following things EVERYDAY: 1) Weigh yourself in the morning before breakfast. Write it down and keep it in a log. 2) Take your medicines as prescribed 3) Eat low salt foods-Limit salt (sodium) to 2000 mg per day.  4) Stay as active as you can everyday 5) Limit all fluids for the day to less than 2 liters

## 2016-05-21 NOTE — Progress Notes (Signed)
Patient ID: [name], male   DOB: 11-25-1960, 55 y.o.   MRN: [id number] PCP: Dr. Nancy Fetter Cardiology: Dr. Aundra Dubin  55 yo returns for evaluation of nonischemic cardiomyopathy.  Patient presented to [facility] in 3/11 after 2-3 weeks of shortness of breath.  He had a flu-like illness with congestion and myalgias and had been treated with clarithromycin as an outpatient.  However, he became progressively short of breath so went into the hospital.  He was noted to have BNP 976 and pulmonary edema on CXR.  Echo showed EF 20-25%.  Left heart cath was done showing mild coronary disease, suggesting that this was likely a nonischemic cardiomyopathy.  Patient was diuresed and begun on cardiac meds.  Cardiac MRI showed EF 20% without significant delayed enhancement, so no evidence for infiltrative disease.  Echo was repeated 9/11, showing some improvement.  Would estimate EF at 40% with diffuse hypokinesis.  Echo in 4/14 showed EF 55% with septal hypokinesis.  5/16 echo also showed EF 50-55% range.   Echo in 7/17 showed EF back down to 30-35%.  He had not been taking his medications regularly (thinks he was missing about 50% of doses), now back to compliance with meds.  He is working as a Presenter, broadcasting at [employer].  No exertional dyspnea but notes increased fatigue over the last few months.  Continues to smoke 5 cigs/day.  No orthopnea/PND.  No chest pain.  No palpitations/lightheadedness/syncope. He has cut back considerably on ETOH.  Mild headache with Bidil but manageable and improving. Mother died recently, he continues to grieve.    Cardiolite done in 8/17, showing EF 36% and possible inferior MI.   ECG (2/17): NSR, early repolarization pattern  Labs (3/11): UPEP/SPEP negative, ANA negative, urine drug screen negative, HIV negative, TSH normal, LDL 158, HDL 106, TGs 200, BNP 976, ESR 3, K 4, creatinine 1.01 Labs (4/11): BNP 212, K 4.2, creatinine 0.97 Labs (7/11): BNP 81, K 4.1, creatinine 0.9 Labs  (10/11): K 4.2, creatinine 0.8, LDL 98, HDL 68 Labs (1/12): K 3.8, creatinine 0.9, BNP 70 Labs (8/12): K 3.8, creatinine 0.9, HDL 90, LDL 48 Labs (3/14): K 3.5, creatinine 0.9 Labs (5/16): LDL 85 Labs (12/16): K 3.9, creatinine 0.86 Labs (7/17): LDL 55, HDL 146, BNP 24, K 3.7, creatinine 0.7 Labs (8/17): K 3.3, creatinine 0.83  Allergies:  1)  ! Pcn  Past Medical History: 1. Nonischemic Cardiomyopathy: LHC (3/11) showed EF 15%, 40% ostial CFX, 40% prox RCA, 40-50% mid RCA.  Echo (3/11): Moderately dilated LV, EF 20-25%, global HK, severe diastolic dysfunction, mild RV dilation and dysfunction, moderate MR. SPEP/UPEP negative, ANA negative, HIV negative, TSH normal, no drug history.  Moderate ETOH use.  Highest suspicion probably viral myocarditis.   Cardiac MRI (4/11) showed a moderately dilated LV with global systolic dysfunction, EF 55%, normal RV size with moderate systolic dysfunction, small area of nonspecific subepicardial basal inferoseptal (RV insertion site) delayed enhancement.  Repeat echo (9/11): EF 40% with mild to moderate diffuse hypokinesis.  Echo (4/14) with EF 55%, septal hypokinesis, mild MR.  - Echo (5/16): EF 50-55%, moderate MR. - Echo (7/17): EF 30-35%, mild MR.   2. Nonobstructive CAD: LHC as above with mild disease.  - Cardiolite (8/17) with EF 36%, possible inferior MI.  3. Smoking 4. Hyperlipidemia  Family History: The patient is adopted so does not know his family history.   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  smoked half pack  per day for over 30 years, now down to 5-6 cigs/day Alcohol Use - yes, 3-4 drinks about 4 times a week. Married, has two children.    ROS: All systems reviewed and negative except as per HPI.   Current Outpatient Prescriptions  Medication Sig Dispense Refill  . amLODipine (NORVASC) 5 MG tablet Take 1 tablet (5 mg total) by mouth daily. 90 tablet 3  . aspirin (ASPIR-81) 81 MG EC  tablet Take 81 mg by mouth daily.      Marland Kitchen atorvastatin (LIPITOR) 20 MG tablet Take 1 tablet (20 mg total) by mouth daily. 90 tablet 3  . carvedilol (COREG) 25 MG tablet Take 1 tablet (25 mg total) by mouth 2 (two) times daily with a meal. 180 tablet 3  . enalapril (VASOTEC) 20 MG tablet Take 1 tablet (20 mg total) by mouth 2 (two) times daily. 180 tablet 3  . isosorbide-hydrALAZINE (BIDIL) 20-37.5 MG tablet Take 1.5 tablets by mouth 3 (three) times daily. 135 tablet 3  . spironolactone (ALDACTONE) 25 MG tablet Take 1 tablet (25 mg total) by mouth daily. 90 tablet 3   No current facility-administered medications for this encounter.     BP 135/75 (BP Location: Left Arm, Patient Position: Sitting, Cuff Size: Normal)   Pulse 84   Resp 18   Wt 141 lb 8 oz (64.2 kg)   SpO2 100%   BMI 21.52 kg/m  General: NAD Neck: No JVD, no thyromegaly or thyroid nodule.  Lungs: Clear to auscultation bilaterally with normal respiratory effort. CV: Nondisplaced PMI.  Heart regular S1/S2, no S3/S4, no murmur.  No peripheral edema.  No carotid bruit.  Normal pedal pulses.  Abdomen: Soft, nontender, no hepatosplenomegaly, no distention.  Neurologic: Alert and oriented x 3.  Psych: Normal affect. Extremities: No clubbing or cyanosis.   Assessment/Plan: 1. Chronic systolic CHF: History of nonischemic cardiomyopathy, had recovered to near normal on 2016 echo but now EF down to 30-35% on 7/17 echo.  No chest pain.  He is not volume overloaded on exam, NYHA I-II symptoms (primarily has some fatigue).  He had not been particularly compliant with his cardiac meds prior to this last echo, and he had been drinking somewhat more than in the past.   He has now cut back on ETOH and is taking all his meds.  - Cardiolite was concerning for prior inferior MI.  Given ongoing smoking and fall in EF, I am going to take him for repeat cardiac cath to assess for coronary disease as a cause of his worsening LV function.  We discussed  the risks/benefits of cath today and he agrees to proceed.  - Continue current Coreg, enalapril, and spironolactone.  BMET today.  - Increase Bidil to 1.5 tabs tid (slow increase as he developed mild headaches that have improved with starting Bidil).   - Repeat echo at 6 months (1/18) after advancing medical therapy.  Narrow QRS, so would not be CRT candidate.  2. HTN: BP controlled.    3. Hyperlipidemia: Good lipids in 7/17.    4. Smoking: I again strongly encouraged cessation.   Followup in 4 wks.   Loralie Champagne 05/21/2016

## 2016-05-26 ENCOUNTER — Encounter (HOSPITAL_COMMUNITY)
Admission: RE | Disposition: A | Discharge status: Home / Self Care | Payer: Self-pay | Source: Ambulatory Visit | Attending: Cardiology

## 2016-05-26 ENCOUNTER — Encounter (HOSPITAL_COMMUNITY): Payer: Self-pay | Admitting: *Deleted

## 2016-05-26 ENCOUNTER — Ambulatory Visit (HOSPITAL_COMMUNITY)
Admission: RE | Admit: 2016-05-26 | Discharge: 2016-05-26 | Disposition: A | Discharge status: Home / Self Care | Payer: BLUE CROSS/BLUE SHIELD | Source: Ambulatory Visit | Attending: Cardiology | Admitting: Cardiology

## 2016-05-26 DIAGNOSIS — R0600 Dyspnea, unspecified: Secondary | ICD-10-CM

## 2016-05-26 DIAGNOSIS — I2510 Atherosclerotic heart disease of native coronary artery without angina pectoris: Secondary | ICD-10-CM | POA: Insufficient documentation

## 2016-05-26 DIAGNOSIS — Z7982 Long term (current) use of aspirin: Secondary | ICD-10-CM | POA: Insufficient documentation

## 2016-05-26 DIAGNOSIS — I110 Hypertensive heart disease with heart failure: Secondary | ICD-10-CM | POA: Diagnosis not present

## 2016-05-26 DIAGNOSIS — E785 Hyperlipidemia, unspecified: Secondary | ICD-10-CM | POA: Diagnosis not present

## 2016-05-26 DIAGNOSIS — I428 Other cardiomyopathies: Secondary | ICD-10-CM | POA: Diagnosis not present

## 2016-05-26 DIAGNOSIS — I5022 Chronic systolic (congestive) heart failure: Secondary | ICD-10-CM | POA: Diagnosis not present

## 2016-05-26 DIAGNOSIS — F17210 Nicotine dependence, cigarettes, uncomplicated: Secondary | ICD-10-CM | POA: Insufficient documentation

## 2016-05-26 HISTORY — PX: CARDIAC CATHETERIZATION: SHX172

## 2016-05-26 SURGERY — LEFT HEART CATH AND CORONARY ANGIOGRAPHY

## 2016-05-26 MED ORDER — SODIUM CHLORIDE 0.9 % IV SOLN: 250.0000 mL | INTRAVENOUS | Status: DC | PRN

## 2016-05-26 MED ORDER — FENTANYL CITRATE (PF) 100 MCG/2ML IJ SOLN
INTRAMUSCULAR | Status: AC
  Filled 2016-05-26: qty 2

## 2016-05-26 MED ORDER — HEPARIN (PORCINE) IN NACL 2-0.9 UNIT/ML-% IJ SOLN
INTRAMUSCULAR | Status: DC | PRN
  Administered 2016-05-26: 17:00:00

## 2016-05-26 MED ORDER — LIDOCAINE HCL (PF) 1 % IJ SOLN
INTRAMUSCULAR | Status: AC
Start: 2016-05-26 — End: 2016-05-26
  Filled 2016-05-26: qty 30

## 2016-05-26 MED ORDER — IOPAMIDOL (ISOVUE-370) INJECTION 76%
INTRAVENOUS | Status: DC | PRN
  Administered 2016-05-26: 100 mL

## 2016-05-26 MED ORDER — LIDOCAINE HCL (PF) 1 % IJ SOLN
INTRAMUSCULAR | Status: DC | PRN
  Administered 2016-05-26: 2 mL via SUBCUTANEOUS

## 2016-05-26 MED ORDER — SODIUM CHLORIDE 0.9 % WEIGHT BASED INFUSION: 3.0000 mL/kg/h | INTRAVENOUS | Status: DC

## 2016-05-26 MED ORDER — IOPAMIDOL (ISOVUE-370) INJECTION 76%
INTRAVENOUS | Status: AC
  Filled 2016-05-26: qty 100

## 2016-05-26 MED ORDER — MIDAZOLAM HCL 2 MG/2ML IJ SOLN
INTRAMUSCULAR | Status: DC | PRN
  Administered 2016-05-26 (×2): 1 mg via INTRAVENOUS

## 2016-05-26 MED ORDER — HEPARIN SODIUM (PORCINE) 1000 UNIT/ML IJ SOLN
INTRAMUSCULAR | Status: AC
  Filled 2016-05-26: qty 1

## 2016-05-26 MED ORDER — SODIUM CHLORIDE 0.9 % IV SOLN
INTRAVENOUS | Status: DC
  Administered 2016-05-26: 15:00:00 via INTRAVENOUS

## 2016-05-26 MED ORDER — HEPARIN SODIUM (PORCINE) 1000 UNIT/ML IJ SOLN
INTRAMUSCULAR | Status: DC | PRN
  Administered 2016-05-26: 3500 [IU] via INTRAVENOUS

## 2016-05-26 MED ORDER — ASPIRIN 81 MG PO CHEW
324.0000 mg | CHEWABLE_TABLET | ORAL | Status: AC
  Administered 2016-05-26: 243 mg via ORAL

## 2016-05-26 MED ORDER — ASPIRIN 81 MG PO CHEW
CHEWABLE_TABLET | ORAL | Status: AC
  Administered 2016-05-26: 243 mg via ORAL
  Filled 2016-05-26: qty 3

## 2016-05-26 MED ORDER — FENTANYL CITRATE (PF) 100 MCG/2ML IJ SOLN
INTRAMUSCULAR | Status: DC | PRN
  Administered 2016-05-26 (×2): 25 ug via INTRAVENOUS

## 2016-05-26 MED ORDER — VERAPAMIL HCL 2.5 MG/ML IV SOLN
INTRAVENOUS | Status: DC | PRN
  Administered 2016-05-26: 16:00:00 via INTRA_ARTERIAL

## 2016-05-26 MED ORDER — MIDAZOLAM HCL 2 MG/2ML IJ SOLN
INTRAMUSCULAR | Status: AC
  Filled 2016-05-26: qty 2

## 2016-05-26 MED ORDER — VERAPAMIL HCL 2.5 MG/ML IV SOLN
INTRAVENOUS | Status: AC
  Filled 2016-05-26: qty 2

## 2016-05-26 MED ORDER — LIDOCAINE HCL (PF) 1 % IJ SOLN
INTRAMUSCULAR | Status: AC
  Filled 2016-05-26: qty 30

## 2016-05-26 MED ORDER — SODIUM CHLORIDE 0.9% FLUSH: 3.0000 mL | INTRAVENOUS | Status: DC | PRN

## 2016-05-26 MED ORDER — NITROGLYCERIN 1 MG/10 ML FOR IR/CATH LAB
INTRA_ARTERIAL | Status: DC | PRN
  Administered 2016-05-26: 200 ug via INTRACORONARY

## 2016-05-26 MED ORDER — HEPARIN (PORCINE) IN NACL 2-0.9 UNIT/ML-% IJ SOLN
INTRAMUSCULAR | Status: AC
  Filled 2016-05-26: qty 1000

## 2016-05-26 MED ORDER — SODIUM CHLORIDE 0.9% FLUSH: 3.0000 mL | Freq: Two times a day (BID) | INTRAVENOUS | Status: DC

## 2016-05-26 SURGICAL SUPPLY — 9 items
CATH IMPULSE 5F ANG/FL3.5 (CATHETERS) ×3 IMPLANT
DEVICE RAD COMP TR BAND LRG (VASCULAR PRODUCTS) ×3 IMPLANT
GLIDESHEATH SLEND SS 6F .021 (SHEATH) ×3 IMPLANT
KIT HEART LEFT (KITS) ×3 IMPLANT
PACK CARDIAC CATHETERIZATION (CUSTOM PROCEDURE TRAY) ×3 IMPLANT
SYR MEDRAD MARK V 150ML (SYRINGE) ×3 IMPLANT
TRANSDUCER W/STOPCOCK (MISCELLANEOUS) ×3 IMPLANT
TUBING CIL FLEX 10 FLL-RA (TUBING) ×3 IMPLANT
WIRE SAFE-T 1.5MM-J .035X260CM (WIRE) ×6 IMPLANT

## 2016-05-26 NOTE — Interval H&P Note (Signed)
History and Physical Interval Note:  05/26/2016 4:15 PM  [name]  has presented today for surgery, with the diagnosis of shortness of breath  The various methods of treatment have been discussed with the patient and family. After consideration of risks, benefits and other options for treatment, the patient has consented to  Procedure(s): Left Heart Cath and Coronary Angiography (N/A) as a surgical intervention .  The patient's history has been reviewed, patient examined, no change in status, stable for surgery.  I have reviewed the patient's chart and labs.  Questions were answered to the patient's satisfaction.     Aleshia Cartelli [employer]

## 2016-05-26 NOTE — H&P (View-Only) (Signed)
Patient ID: [name], male   DOB: November 14, 1960, 55 y.o.   MRN: [id number] PCP: Dr. Wynelle Link Cardiology: Dr. Shirlee Latch  55 yo returns for evaluation of nonischemic cardiomyopathy.  Patient presented to [facility] in 3/11 after 2-3 weeks of shortness of breath.  He had a flu-like illness with congestion and myalgias and had been treated with clarithromycin as an outpatient.  However, he became progressively short of breath so went into the hospital.  He was noted to have BNP 976 and pulmonary edema on CXR.  Echo showed EF 20-25%.  Left heart cath was done showing mild coronary disease, suggesting that this was likely a nonischemic cardiomyopathy.  Patient was diuresed and begun on cardiac meds.  Cardiac MRI showed EF 20% without significant delayed enhancement, so no evidence for infiltrative disease.  Echo was repeated 9/11, showing some improvement.  Would estimate EF at 40% with diffuse hypokinesis.  Echo in 4/14 showed EF 55% with septal hypokinesis.  5/16 echo also showed EF 50-55% range.   Echo in 7/17 showed EF back down to 30-35%.  He had not been taking his medications regularly (thinks he was missing about 50% of doses), now back to compliance with meds.  He is working as a Electrical engineer at [employer].  No exertional dyspnea but notes increased fatigue over the last few months.  Continues to smoke 5 cigs/day.  No orthopnea/PND.  No chest pain.  No palpitations/lightheadedness/syncope. He has cut back considerably on ETOH.  Mild headache with Bidil but manageable and improving. Mother died recently, he continues to grieve.    Cardiolite done in 8/17, showing EF 36% and possible inferior MI.   ECG (2/17): NSR, early repolarization pattern  Labs (3/11): UPEP/SPEP negative, ANA negative, urine drug screen negative, HIV negative, TSH normal, LDL 158, HDL 106, TGs 200, BNP 976, ESR 3, K 4, creatinine 1.01 Labs (4/11): BNP 212, K 4.2, creatinine 0.97 Labs (7/11): BNP 81, K 4.1, creatinine 0.9 Labs  (10/11): K 4.2, creatinine 0.8, LDL 98, HDL 68 Labs (1/12): K 3.8, creatinine 0.9, BNP 70 Labs (8/12): K 3.8, creatinine 0.9, HDL 90, LDL 48 Labs (3/14): K 3.5, creatinine 0.9 Labs (5/16): LDL 85 Labs (12/16): K 3.9, creatinine 0.86 Labs (7/17): LDL 55, HDL 146, BNP 24, K 3.7, creatinine 0.7 Labs (8/17): K 3.3, creatinine 0.83  Allergies:  1)  ! Pcn  Past Medical History: 1. Nonischemic Cardiomyopathy: LHC (3/11) showed EF 15%, 40% ostial CFX, 40% prox RCA, 40-50% mid RCA.  Echo (3/11): Moderately dilated LV, EF 20-25%, global HK, severe diastolic dysfunction, mild RV dilation and dysfunction, moderate MR. SPEP/UPEP negative, ANA negative, HIV negative, TSH normal, no drug history.  Moderate ETOH use.  Highest suspicion probably viral myocarditis.   Cardiac MRI (4/11) showed a moderately dilated LV with global systolic dysfunction, EF 20%, normal RV size with moderate systolic dysfunction, small area of nonspecific subepicardial basal inferoseptal (RV insertion site) delayed enhancement.  Repeat echo (9/11): EF 40% with mild to moderate diffuse hypokinesis.  Echo (4/14) with EF 55%, septal hypokinesis, mild MR.  - Echo (5/16): EF 50-55%, moderate MR. - Echo (7/17): EF 30-35%, mild MR.   2. Nonobstructive CAD: LHC as above with mild disease.  - Cardiolite (8/17) with EF 36%, possible inferior MI.  3. Smoking 4. Hyperlipidemia  Family History: The patient is adopted so does not know his family history.   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  smoked half pack  per day for over 30 years, now down to 5-6 cigs/day Alcohol Use - yes, 3-4 drinks about 4 times a week. Married, has two children.    ROS: All systems reviewed and negative except as per HPI.   Current Outpatient Prescriptions  Medication Sig Dispense Refill  . amLODipine (NORVASC) 5 MG tablet Take 1 tablet (5 mg total) by mouth daily. 90 tablet 3  . aspirin (ASPIR-81) 81 MG EC  tablet Take 81 mg by mouth daily.      Marland Kitchen atorvastatin (LIPITOR) 20 MG tablet Take 1 tablet (20 mg total) by mouth daily. 90 tablet 3  . carvedilol (COREG) 25 MG tablet Take 1 tablet (25 mg total) by mouth 2 (two) times daily with a meal. 180 tablet 3  . enalapril (VASOTEC) 20 MG tablet Take 1 tablet (20 mg total) by mouth 2 (two) times daily. 180 tablet 3  . isosorbide-hydrALAZINE (BIDIL) 20-37.5 MG tablet Take 1.5 tablets by mouth 3 (three) times daily. 135 tablet 3  . spironolactone (ALDACTONE) 25 MG tablet Take 1 tablet (25 mg total) by mouth daily. 90 tablet 3   No current facility-administered medications for this encounter.     BP 135/75 (BP Location: Left Arm, Patient Position: Sitting, Cuff Size: Normal)   Pulse 84   Resp 18   Wt 141 lb 8 oz (64.2 kg)   SpO2 100%   BMI 21.52 kg/m  General: NAD Neck: No JVD, no thyromegaly or thyroid nodule.  Lungs: Clear to auscultation bilaterally with normal respiratory effort. CV: Nondisplaced PMI.  Heart regular S1/S2, no S3/S4, no murmur.  No peripheral edema.  No carotid bruit.  Normal pedal pulses.  Abdomen: Soft, nontender, no hepatosplenomegaly, no distention.  Neurologic: Alert and oriented x 3.  Psych: Normal affect. Extremities: No clubbing or cyanosis.   Assessment/Plan: 1. Chronic systolic CHF: History of nonischemic cardiomyopathy, had recovered to near normal on 2016 echo but now EF down to 30-35% on 7/17 echo.  No chest pain.  He is not volume overloaded on exam, NYHA I-II symptoms (primarily has some fatigue).  He had not been particularly compliant with his cardiac meds prior to this last echo, and he had been drinking somewhat more than in the past.   He has now cut back on ETOH and is taking all his meds.  - Cardiolite was concerning for prior inferior MI.  Given ongoing smoking and fall in EF, I am going to take him for repeat cardiac cath to assess for coronary disease as a cause of his worsening LV function.  We discussed  the risks/benefits of cath today and he agrees to proceed.  - Continue current Coreg, enalapril, and spironolactone.  BMET today.  - Increase Bidil to 1.5 tabs tid (slow increase as he developed mild headaches that have improved with starting Bidil).   - Repeat echo at 6 months (1/18) after advancing medical therapy.  Narrow QRS, so would not be CRT candidate.  2. HTN: BP controlled.    3. Hyperlipidemia: Good lipids in 7/17.    4. Smoking: I again strongly encouraged cessation.   Followup in 4 wks.   Loralie Champagne 05/21/2016

## 2016-05-26 NOTE — Progress Notes (Signed)
This nurse called and informed Victorino Dike, RN that pt ate a bratwurst sandwich with water at 0530 this morning, pt works 3rd shift. Okay to proceed per Victorino Dike, Charity fundraiser.

## 2016-05-26 NOTE — Discharge Instructions (Signed)
Radial Site Care °Refer to this sheet in the next few weeks. These instructions provide you with information about caring for yourself after your procedure. Your health care provider may also give you more specific instructions. Your treatment has been planned according to current medical practices, but problems sometimes occur. Call your health care provider if you have any problems or questions after your procedure. °WHAT TO EXPECT AFTER THE PROCEDURE °After your procedure, it is typical to have the following: °· Bruising at the radial site that usually fades within 1-2 weeks. °· Blood collecting in the tissue (hematoma) that may be painful to the touch. It should usually decrease in size and tenderness within 1-2 weeks. °HOME CARE INSTRUCTIONS °· Take medicines only as directed by your health care provider. °· You may shower 24-48 hours after the procedure or as directed by your health care provider. Remove the bandage (dressing) and gently wash the site with plain soap and water. Pat the area dry with a clean towel. Do not rub the site, because this may cause bleeding. °· Do not take baths, swim, or use a hot tub until your health care provider approves. °· Check your insertion site every day for redness, swelling, or drainage. °· Do not apply powder or lotion to the site. °· Do not flex or bend the affected arm for 24 hours or as directed by your health care provider. °· Do not push or pull heavy objects with the affected arm for 24 hours or as directed by your health care provider. °· Do not lift over 10 lb (4.5 kg) for 5 days after your procedure or as directed by your health care provider. °· Ask your health care provider when it is okay to: °¨ Return to work or school. °¨ Resume usual physical activities or sports. °¨ Resume sexual activity. °· Do not drive home if you are discharged the same day as the procedure. Have someone else drive you. °· You may drive 24 hours after the procedure unless otherwise  instructed by your health care provider. °· Do not operate machinery or power tools for 24 hours after the procedure. °· If your procedure was done as an outpatient procedure, which means that you went home the same day as your procedure, a responsible adult should be with you for the first 24 hours after you arrive home. °· Keep all follow-up visits as directed by your health care provider. This is important. °SEEK MEDICAL CARE IF: °· You have a fever. °· You have chills. °· You have increased bleeding from the radial site. Hold pressure on the site. °SEEK IMMEDIATE MEDICAL CARE IF: °· You have unusual pain at the radial site. °· You have redness, warmth, or swelling at the radial site. °· You have drainage (other than a small amount of blood on the dressing) from the radial site. °· The radial site is bleeding, and the bleeding does not stop after 30 minutes of holding steady pressure on the site. °· Your arm or hand becomes pale, cool, tingly, or numb. °  °This information is not intended to replace advice given to you by your health care provider. Make sure you discuss any questions you have with your health care provider. °  °Document Released: 09/25/2010 Document Revised: 09/13/2014 Document Reviewed: 03/11/2014 °Elsevier Interactive Patient Education ©2016 [employer] ° °

## 2016-05-27 ENCOUNTER — Encounter (HOSPITAL_COMMUNITY): Payer: Self-pay | Admitting: Cardiology

## 2016-06-16 ENCOUNTER — Inpatient Hospital Stay (HOSPITAL_COMMUNITY): Admission: RE | Admit: 2016-06-16 | Payer: BLUE CROSS/BLUE SHIELD | Source: Ambulatory Visit

## 2016-06-17 ENCOUNTER — Ambulatory Visit (HOSPITAL_COMMUNITY)
Admission: RE | Admit: 2016-06-17 | Discharge: 2016-06-17 | Disposition: A | Discharge status: Home / Self Care | Payer: BLUE CROSS/BLUE SHIELD | Source: Ambulatory Visit | Attending: Cardiology | Admitting: Cardiology

## 2016-06-17 ENCOUNTER — Encounter (HOSPITAL_COMMUNITY): Payer: Self-pay

## 2016-06-17 VITALS — BP 127/71 | HR 85 | Ht 68.0 in | Wt 149.8 lb

## 2016-06-17 DIAGNOSIS — I2510 Atherosclerotic heart disease of native coronary artery without angina pectoris: Secondary | ICD-10-CM | POA: Insufficient documentation

## 2016-06-17 DIAGNOSIS — F17210 Nicotine dependence, cigarettes, uncomplicated: Secondary | ICD-10-CM | POA: Diagnosis not present

## 2016-06-17 DIAGNOSIS — E785 Hyperlipidemia, unspecified: Secondary | ICD-10-CM | POA: Diagnosis not present

## 2016-06-17 DIAGNOSIS — I429 Cardiomyopathy, unspecified: Secondary | ICD-10-CM | POA: Diagnosis not present

## 2016-06-17 DIAGNOSIS — Z7982 Long term (current) use of aspirin: Secondary | ICD-10-CM | POA: Diagnosis not present

## 2016-06-17 DIAGNOSIS — I5022 Chronic systolic (congestive) heart failure: Secondary | ICD-10-CM | POA: Diagnosis not present

## 2016-06-17 DIAGNOSIS — I110 Hypertensive heart disease with heart failure: Secondary | ICD-10-CM | POA: Diagnosis not present

## 2016-06-17 LAB — BASIC METABOLIC PANEL
Anion gap: 5 (ref 5–15)
BUN: 10 mg/dL (ref 6–20)
CHLORIDE: 106 mmol/L (ref 101–111)
CO2: 27 mmol/L (ref 22–32)
Calcium: 8.9 mg/dL (ref 8.9–10.3)
Creatinine, Ser: 1.05 mg/dL (ref 0.61–1.24)
GFR calc Af Amer: >60 mL/min (ref >60)
GFR calc non Af Amer: >60 mL/min (ref >60)
Glucose, Bld: 104 mg/dL — ABNORMAL HIGH (ref 65–99)
POTASSIUM: 3.8 mmol/L (ref 3.5–5.1)
SODIUM: 138 mmol/L (ref 135–145)

## 2016-06-17 MED ORDER — ISOSORB DINITRATE-HYDRALAZINE 20-37.5 MG PO TABS: 1.5000 | ORAL_TABLET | Freq: Three times a day (TID) | ORAL | 3 refills | Status: DC

## 2016-06-17 NOTE — Patient Instructions (Signed)
Increase Bidil to 1 & 1/2 tab Three times a day   Labs today  Your physician recommends that you schedule a follow-up appointment in: January with echocardiogram

## 2016-06-18 NOTE — Progress Notes (Signed)
Patient ID: [name], male   DOB: 11/26/1960, 55 y.o.   MRN: [id number] PCP: Dr. Sun Cardiology: Dr. McLean  55 yo returns for evaluation of nonischemic cardiomyopathy.  Patient presented to [place] in 3/11 after 2-3 weeks of shortness of breath.  He had a flu-like illness with congestion and myalgias and had been treated with clarithromycin as an outpatient.  However, he became progressively short of breath so went into the hospital.  He was noted to have BNP 976 and pulmonary edema on CXR.  Echo showed EF 20-25%.  Left heart cath was done showing mild coronary disease, suggesting that this was likely a nonischemic cardiomyopathy.  Patient was diuresed and begun on cardiac meds.  Cardiac MRI showed EF 20% without significant delayed enhancement, so no evidence for infiltrative disease.  Echo was repeated 9/11, showing some improvement.  Would estimate EF at 40% with diffuse hypokinesis.  Echo in 4/14 showed EF 55% with septal hypokinesis.  5/16 echo also showed EF 50-55% range.   Echo in 7/17 showed EF back down to 30-35%.  He had not been taking his medications regularly (thinks he was missing about 50% of doses), now back to compliance with meds.  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  No exertional dyspnea but notes increased fatigue over the last few months.  Continues to smoke 5 cigs/day.  No orthopnea/PND.  No chest pain.  No palpitations/lightheadedness/syncope. He has cut back considerably on ETOH.  Tolerating Bidil without problems but did not increase dose after last appointment as requested.     Cardiolite done in 8/17, showing EF 36% and possible inferior MI.  Repeat cardiac cath in 9/17 after abnormal Cardiolite showed diffuse but nonobstructive CAD.   ECG (2/17): NSR, early repolarization pattern  Labs (3/11): UPEP/SPEP negative, ANA negative, urine drug screen negative, HIV negative, TSH normal, LDL 158, HDL 106, TGs 200, BNP 976, ESR 3, K 4, creatinine 1.01 Labs  (4/11): BNP 212, K 4.2, creatinine 0.97 Labs (7/11): BNP 81, K 4.1, creatinine 0.9 Labs (10/11): K 4.2, creatinine 0.8, LDL 98, HDL 68 Labs (1/12): K 3.8, creatinine 0.9, BNP 70 Labs (8/12): K 3.8, creatinine 0.9, HDL 90, LDL 48 Labs (3/14): K 3.5, creatinine 0.9 Labs (5/16): LDL 85 Labs (12/16): K 3.9, creatinine 0.86 Labs (7/17): LDL 55, HDL 146, BNP 24, K 3.7, creatinine 0.7 Labs (8/17): K 3.3, creatinine 0.83 Labs (9/17): K 3.9, creatinine 0.85  Allergies:  1)  ! Pcn  Past Medical History: 1. Nonischemic Cardiomyopathy: LHC (3/11) showed EF 15%, 40% ostial CFX, 40% prox RCA, 40-50% mid RCA.  Echo (3/11): Moderately dilated LV, EF 20-25%, global HK, severe diastolic dysfunction, mild RV dilation and dysfunction, moderate MR. SPEP/UPEP negative, ANA negative, HIV negative, TSH normal, no drug history.  Moderate ETOH use.  Highest suspicion probably viral myocarditis.   Cardiac MRI (4/11) showed a moderately dilated LV with global systolic dysfunction, EF 20%, normal RV size with moderate systolic dysfunction, small area of nonspecific subepicardial basal inferoseptal (RV insertion site) delayed enhancement.  Repeat echo (9/11): EF 40% with mild to moderate diffuse hypokinesis.  Echo (4/14) with EF 55%, septal hypokinesis, mild MR.  - Echo (5/16): EF 50-55%, moderate MR. - Echo (7/17): EF 30-35%, mild MR.   - LHC (9/17) with nonobstructive CAD.  2. Nonobstructive CAD:  - LHC (3/11) showed EF 15%, 40% ostial CFX, 40% prox RCA, 40-50% mid RCA.  - Cardiolite (8/17) with EF 36%, possible inferior MI.  -   LHC (9/17) with EF 45%, 50% ostial stenosis small AV LCx, diffuse RCA disease, 40% proximal/50% mid.  3. Smoking 4. Hyperlipidemia  Family History: The patient is adopted so does not know his family history.   Social History: patient works as security guard at [employer] Tobacco Use - Yes.  smoked half pack per day for over 30 years, now down to 5-6 cigs/day Alcohol Use - yes, 3-4  drinks about 4 times a week. Married, has two children.    ROS: All systems reviewed and negative except as per HPI.   Current Outpatient Prescriptions  Medication Sig Dispense Refill  . amLODipine (NORVASC) 5 MG tablet Take 1 tablet (5 mg total) by mouth daily. 90 tablet 3  . aspirin (ASPIR-81) 81 MG EC tablet Take 81 mg by mouth daily.      . atorvastatin (LIPITOR) 20 MG tablet Take 1 tablet (20 mg total) by mouth daily. 90 tablet 3  . carvedilol (COREG) 25 MG tablet Take 1 tablet (25 mg total) by mouth 2 (two) times daily with a meal. 180 tablet 3  . enalapril (VASOTEC) 20 MG tablet Take 1 tablet (20 mg total) by mouth 2 (two) times daily. 180 tablet 3  . isosorbide-hydrALAZINE (BIDIL) 20-37.5 MG tablet Take 1.5 tablets by mouth 3 (three) times daily. 135 tablet 3  . spironolactone (ALDACTONE) 25 MG tablet Take 1 tablet (25 mg total) by mouth daily. 90 tablet 3   No current facility-administered medications for this encounter.     BP 127/71 (BP Location: Left Arm, Patient Position: Sitting, Cuff Size: Normal)   Pulse 85   Ht 5' 8" (1.727 m)   Wt 149 lb 12.8 oz (67.9 kg)   SpO2 100%   BMI 22.78 kg/m  General: NAD Neck: No JVD, no thyromegaly or thyroid nodule.  Lungs: Clear to auscultation bilaterally with normal respiratory effort. CV: Nondisplaced PMI.  Heart regular S1/S2, no S3/S4, no murmur.  No peripheral edema.  No carotid bruit.  Normal pedal pulses.  Abdomen: Soft, nontender, no hepatosplenomegaly, no distention.  Neurologic: Alert and oriented x 3.  Psych: Normal affect. Extremities: No clubbing or cyanosis. Right radial cath site benign.   Assessment/Plan: 1. Chronic systolic CHF: History of nonischemic cardiomyopathy, had recovered to near normal on 2016 echo but now EF down to 30-35% on 7/17 echo.  Repeat LHC in 9/17 showed diffuse but nonobstructive CAD.  He is not volume overloaded on exam, NYHA I-II symptoms (primarily has some fatigue).  He had not been  particularly compliant with his cardiac meds prior to this last echo, and he had been drinking somewhat more than in the past.   He has now cut back on ETOH and is taking all his meds.  - Continue current Coreg, enalapril, and spironolactone.  BMET today.  - Increase Bidil to 1.5 tabs tid (slow increase as he developed mild headaches that have improved with starting Bidil).   - Repeat echo at 6 months (1/18) after advancing medical therapy.  Narrow QRS, so would not be CRT candidate.  2. HTN: BP controlled.    3. Hyperlipidemia: Good lipids in 7/17.    4. Smoking: I again strongly encouraged cessation.  5. CAD: Diffuse nonobstructive disease on 9/17 cath. Continue ASA 81 and atorvastatin with goal LDL < 70.  Needs to quit smoking.   Followup in 1/18 with echo.   Dalton McLean 06/18/2016   

## 2016-10-26 ENCOUNTER — Encounter (HOSPITAL_COMMUNITY): Payer: Self-pay

## 2016-10-26 ENCOUNTER — Ambulatory Visit (HOSPITAL_COMMUNITY)
Admission: RE | Admit: 2016-10-26 | Discharge: 2016-10-26 | Disposition: A | Discharge status: Home / Self Care | Payer: BLUE CROSS/BLUE SHIELD | Source: Ambulatory Visit | Attending: Cardiology | Admitting: Cardiology

## 2016-10-26 ENCOUNTER — Ambulatory Visit (HOSPITAL_BASED_OUTPATIENT_CLINIC_OR_DEPARTMENT_OTHER)
Admission: RE | Admit: 2016-10-26 | Discharge: 2016-10-26 | Disposition: A | Discharge status: Home / Self Care | Payer: BLUE CROSS/BLUE SHIELD | Source: Ambulatory Visit | Attending: Cardiology | Admitting: Cardiology

## 2016-10-26 VITALS — BP 108/70 | HR 72 | Wt 158.5 lb

## 2016-10-26 DIAGNOSIS — F17200 Nicotine dependence, unspecified, uncomplicated: Secondary | ICD-10-CM | POA: Diagnosis not present

## 2016-10-26 DIAGNOSIS — I5022 Chronic systolic (congestive) heart failure: Secondary | ICD-10-CM | POA: Insufficient documentation

## 2016-10-26 DIAGNOSIS — I429 Cardiomyopathy, unspecified: Secondary | ICD-10-CM | POA: Insufficient documentation

## 2016-10-26 DIAGNOSIS — E785 Hyperlipidemia, unspecified: Secondary | ICD-10-CM | POA: Diagnosis not present

## 2016-10-26 DIAGNOSIS — I2510 Atherosclerotic heart disease of native coronary artery without angina pectoris: Secondary | ICD-10-CM | POA: Insufficient documentation

## 2016-10-26 LAB — BASIC METABOLIC PANEL
Anion gap: 7 (ref 5–15)
BUN: 11 mg/dL (ref 6–20)
CHLORIDE: 105 mmol/L (ref 101–111)
CO2: 26 mmol/L (ref 22–32)
Calcium: 9.3 mg/dL (ref 8.9–10.3)
Creatinine, Ser: 0.8 mg/dL (ref 0.61–1.24)
GFR calc Af Amer: >60 mL/min (ref >60)
GFR calc non Af Amer: >60 mL/min (ref >60)
Glucose, Bld: 112 mg/dL — ABNORMAL HIGH (ref 65–99)
POTASSIUM: 4.4 mmol/L (ref 3.5–5.1)
Sodium: 138 mmol/L (ref 135–145)

## 2016-10-26 MED ORDER — AMLODIPINE BESYLATE 5 MG PO TABS: 5.0000 mg | ORAL_TABLET | Freq: Every day | ORAL | 3 refills | Status: DC

## 2016-10-26 MED ORDER — SPIRONOLACTONE 25 MG PO TABS: 25.0000 mg | ORAL_TABLET | Freq: Every day | ORAL | 3 refills | Status: DC

## 2016-10-26 MED ORDER — CARVEDILOL 25 MG PO TABS: 25.0000 mg | ORAL_TABLET | Freq: Two times a day (BID) | ORAL | 3 refills | Status: DC

## 2016-10-26 MED ORDER — ISOSORB DINITRATE-HYDRALAZINE 20-37.5 MG PO TABS: 1.5000 | ORAL_TABLET | Freq: Three times a day (TID) | ORAL | 3 refills | Status: DC

## 2016-10-26 MED ORDER — ISOSORB DINITRATE-HYDRALAZINE 20-37.5 MG PO TABS: 1.5000 | ORAL_TABLET | Freq: Three times a day (TID) | ORAL | 11 refills | Status: DC

## 2016-10-26 MED ORDER — ENALAPRIL MALEATE 20 MG PO TABS: 20.0000 mg | ORAL_TABLET | Freq: Two times a day (BID) | ORAL | 3 refills | Status: DC

## 2016-10-26 MED ORDER — ASPIRIN 81 MG PO TBEC: 81.0000 mg | DELAYED_RELEASE_TABLET | Freq: Every day | ORAL | 3 refills | Status: DC

## 2016-10-26 MED ORDER — ATORVASTATIN CALCIUM 20 MG PO TABS: 20.0000 mg | ORAL_TABLET | Freq: Every day | ORAL | 3 refills | Status: DC

## 2016-10-26 NOTE — Patient Instructions (Signed)
Refills on all medications sent to your pharmacy.  No changes to medication today.  Routine lab work today. Will notify you of abnormal results, otherwise no news is good news!  Repeat labs in 3 months.  Follow up with Dr. Shirlee Latch in 6 months.  Do the following things EVERYDAY: 1) Weigh yourself in the morning before breakfast. Write it down and keep it in a log. 2) Take your medicines as prescribed 3) Eat low salt foods-Limit salt (sodium) to 2000 mg per day.  4) Stay as active as you can everyday 5) Limit all fluids for the day to less than 2 liters

## 2016-10-26 NOTE — Progress Notes (Signed)
  Echocardiogram 2D Echocardiogram has been performed.  Leta Jungling M 10/26/2016, 10:49 AM

## 2016-10-28 NOTE — Progress Notes (Signed)
Patient ID: [name], male   DOB: [birth year], 56 y.o.   MRN: [id number] PCP: Dr. Wynelle Link Cardiology: Dr. Shirlee Latch  56 yo returns for evaluation of nonischemic cardiomyopathy.  Patient presented to [facility] in 3/11 after 2-3 weeks of shortness of breath.  He had a flu-like illness with congestion and myalgias and had been treated with clarithromycin as an outpatient.  However, he became progressively short of breath so went into the hospital.  He was noted to have BNP 976 and pulmonary edema on CXR.  Echo showed EF 20-25%.  Left heart cath was done showing mild coronary disease, suggesting that this was likely a nonischemic cardiomyopathy.  Patient was diuresed and begun on cardiac meds.  Cardiac MRI showed EF 20% without significant delayed enhancement, so no evidence for infiltrative disease.  Echo was repeated 9/11, showing some improvement.  Would estimate EF at 40% with diffuse hypokinesis.  Echo in 4/14 showed EF 55% with septal hypokinesis.  5/16 echo also showed EF 50-55% range.   Echo in 7/17 showed EF back down to 30-35%.  He had not been taking his medications regularly (thinks he was missing about 50% of doses), now back to compliance with meds.  He is working as a Electrical engineer at [employer].  No exertional dyspnea but notes increased fatigue over the last few months.  Cutting back on smoking.  No orthopnea/PND.  No chest pain.  No palpitations/lightheadedness/syncope. He has cut back considerably on ETOH.    Cardiolite done in 8/17, showing EF 36% and possible inferior MI.  Repeat cardiac cath in 9/17 after abnormal Cardiolite showed diffuse but nonobstructive CAD.   Echo done today was reviewed and showed EF improved to 50%.    ECG (2/17): NSR, early repolarization pattern  Labs (3/11): UPEP/SPEP negative, ANA negative, urine drug screen negative, HIV negative, TSH normal, LDL 158, HDL 106, TGs 200, BNP 976, ESR 3, K 4, creatinine 1.01 Labs (4/11): BNP 212, K 4.2, creatinine  0.97 Labs (7/11): BNP 81, K 4.1, creatinine 0.9 Labs (10/11): K 4.2, creatinine 0.8, LDL 98, HDL 68 Labs (1/12): K 3.8, creatinine 0.9, BNP 70 Labs (8/12): K 3.8, creatinine 0.9, HDL 90, LDL 48 Labs (3/14): K 3.5, creatinine 0.9 Labs (5/16): LDL 85 Labs (12/16): K 3.9, creatinine 0.86 Labs (7/17): LDL 55, HDL 146, BNP 24, K 3.7, creatinine 0.7 Labs (8/17): K 3.3, creatinine 0.83 Labs (9/17): K 3.9, creatinine 0.85 Labs (10/17): K 3.8, creatinine 1.05  Allergies:  1)  ! Pcn  Past Medical History: 1. Nonischemic Cardiomyopathy: LHC (3/11) showed EF 15%, 40% ostial CFX, 40% prox RCA, 40-50% mid RCA.  Echo (3/11): Moderately dilated LV, EF 20-25%, global HK, severe diastolic dysfunction, mild RV dilation and dysfunction, moderate MR. SPEP/UPEP negative, ANA negative, HIV negative, TSH normal, no drug history.  Moderate ETOH use.  Highest suspicion probably viral myocarditis.   Cardiac MRI (4/11) showed a moderately dilated LV with global systolic dysfunction, EF 20%, normal RV size with moderate systolic dysfunction, small area of nonspecific subepicardial basal inferoseptal (RV insertion site) delayed enhancement.  Repeat echo (9/11): EF 40% with mild to moderate diffuse hypokinesis.  Echo (4/14) with EF 55%, septal hypokinesis, mild MR.  - Echo (5/16): EF 50-55%, moderate MR. - Echo (7/17): EF 30-35%, mild MR.   - LHC (9/17) with nonobstructive CAD.  - Echo (2/18): EF 50%, diffuse hypokinesis, normal RV size and systolic function.  2. Nonobstructive CAD:  - LHC (3/11) showed EF 15%, 40% ostial CFX,  40% prox RCA, 40-50% mid RCA.  - Cardiolite (8/17) with EF 36%, possible inferior MI.  - LHC (9/17) with EF 45%, 50% ostial stenosis small AV LCx, diffuse RCA disease, 40% proximal/50% mid.  3. Smoking 4. Hyperlipidemia  Family History: The patient is adopted so does not know his family history.   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  smoked  half pack per day for over 30 years, now down to 5-6 cigs/day Alcohol Use - yes, 3-4 drinks about 4 times a week. Married, has two children.    ROS: All systems reviewed and negative except as per HPI.   Current Outpatient Prescriptions  Medication Sig Dispense Refill  . amLODipine (NORVASC) 5 MG tablet Take 1 tablet (5 mg total) by mouth daily. 90 tablet 3  . aspirin (ASPIR-81) 81 MG EC tablet Take 1 tablet (81 mg total) by mouth daily. 90 tablet 3  . atorvastatin (LIPITOR) 20 MG tablet Take 1 tablet (20 mg total) by mouth daily. 90 tablet 3  . carvedilol (COREG) 25 MG tablet Take 1 tablet (25 mg total) by mouth 2 (two) times daily with a meal. 180 tablet 3  . enalapril (VASOTEC) 20 MG tablet Take 1 tablet (20 mg total) by mouth 2 (two) times daily. 180 tablet 3  . isosorbide-hydrALAZINE (BIDIL) 20-37.5 MG tablet Take 1.5 tablets by mouth 3 (three) times daily. 135 tablet 11  . spironolactone (ALDACTONE) 25 MG tablet Take 1 tablet (25 mg total) by mouth daily. 90 tablet 3   No current facility-administered medications for this encounter.     BP 108/70   Pulse 72   Wt 158 lb 8 oz (71.9 kg)   SpO2 100%   BMI 24.10 kg/m  General: NAD Neck: No JVD, no thyromegaly or thyroid nodule.  Lungs: Clear to auscultation bilaterally with normal respiratory effort. CV: Nondisplaced PMI.  Heart regular S1/S2, no S3/S4, no murmur.  No peripheral edema.  No carotid bruit.  Normal pedal pulses.  Abdomen: Soft, nontender, no hepatosplenomegaly, no distention.  Neurologic: Alert and oriented x 3.  Psych: Normal affect. Extremities: No clubbing or cyanosis. Right radial cath site benign.   Assessment/Plan: 1. Chronic systolic CHF: History of nonischemic cardiomyopathy, had recovered to near normal on 2016 echo but EF down to 30-35% on 7/17 echo.  Repeat LHC in 9/17 showed diffuse but nonobstructive CAD.  He is not volume overloaded on exam, NYHA I-II symptoms (primarily has some fatigue).  He had not  been particularly compliant with his cardiac meds prior to this last echo, and he had been drinking somewhat more than in the past.   He has now cut back on ETOH and is taking all his meds. EF up to 50% on 2/18 echo.  - Continue current Coreg, enalapril, and spironolactone.  BMET today and again in 3 months.   - Continue Bidil 1.5 tabs tid.   - He is now out of ICD range.   2. HTN: BP controlled.    3. Hyperlipidemia: Good lipids in 7/17.    4. Smoking: I again strongly encouraged cessation, he is trying.  5. CAD: Diffuse nonobstructive disease on 9/17 cath. Continue ASA 81 and atorvastatin with goal LDL < 70.  Needs to quit smoking.  6. Weight is up, I encouraged exercise.   Followup in 6 months.   Loralie Champagne 10/28/2016

## 2016-11-16 DIAGNOSIS — I110 Hypertensive heart disease with heart failure: Secondary | ICD-10-CM | POA: Diagnosis not present

## 2016-11-16 DIAGNOSIS — I5022 Chronic systolic (congestive) heart failure: Secondary | ICD-10-CM | POA: Diagnosis not present

## 2016-11-16 DIAGNOSIS — Z0000 Encounter for general adult medical examination without abnormal findings: Secondary | ICD-10-CM | POA: Diagnosis not present

## 2016-11-16 DIAGNOSIS — Z125 Encounter for screening for malignant neoplasm of prostate: Secondary | ICD-10-CM | POA: Diagnosis not present

## 2016-11-16 DIAGNOSIS — Z23 Encounter for immunization: Secondary | ICD-10-CM | POA: Diagnosis not present

## 2017-01-24 ENCOUNTER — Ambulatory Visit (HOSPITAL_COMMUNITY)
Admission: RE | Admit: 2017-01-24 | Discharge: 2017-01-24 | Disposition: A | Discharge status: Home / Self Care | Payer: BLUE CROSS/BLUE SHIELD | Source: Ambulatory Visit | Attending: Internal Medicine | Admitting: Internal Medicine

## 2017-01-24 DIAGNOSIS — I5022 Chronic systolic (congestive) heart failure: Secondary | ICD-10-CM | POA: Diagnosis not present

## 2017-01-24 LAB — BASIC METABOLIC PANEL
Anion gap: 9 (ref 5–15)
BUN: 16 mg/dL (ref 6–20)
CALCIUM: 9.1 mg/dL (ref 8.9–10.3)
CO2: 27 mmol/L (ref 22–32)
CREATININE: 0.91 mg/dL (ref 0.61–1.24)
Chloride: 101 mmol/L (ref 101–111)
GFR calc Af Amer: >60 mL/min (ref >60)
GLUCOSE: 93 mg/dL (ref 65–99)
Potassium: 3.6 mmol/L (ref 3.5–5.1)
Sodium: 137 mmol/L (ref 135–145)

## 2017-03-17 ENCOUNTER — Other Ambulatory Visit (HOSPITAL_COMMUNITY): Payer: Self-pay | Admitting: Cardiology

## 2017-03-17 DIAGNOSIS — I5022 Chronic systolic (congestive) heart failure: Secondary | ICD-10-CM

## 2017-03-17 MED ORDER — ASPIRIN 81 MG PO TBEC: 81.0000 mg | DELAYED_RELEASE_TABLET | Freq: Every day | ORAL | 3 refills | Status: DC

## 2017-03-17 MED ORDER — AMLODIPINE BESYLATE 5 MG PO TABS: 5.0000 mg | ORAL_TABLET | Freq: Every day | ORAL | 3 refills | Status: DC

## 2017-03-17 MED ORDER — ISOSORB DINITRATE-HYDRALAZINE 20-37.5 MG PO TABS: 1.5000 | ORAL_TABLET | Freq: Three times a day (TID) | ORAL | 11 refills | Status: DC

## 2017-03-17 MED ORDER — ENALAPRIL MALEATE 20 MG PO TABS: 20.0000 mg | ORAL_TABLET | Freq: Two times a day (BID) | ORAL | 3 refills | Status: DC

## 2017-03-17 MED ORDER — CARVEDILOL 25 MG PO TABS: 25.0000 mg | ORAL_TABLET | Freq: Two times a day (BID) | ORAL | 3 refills | Status: DC

## 2017-03-17 MED ORDER — SPIRONOLACTONE 25 MG PO TABS: 25.0000 mg | ORAL_TABLET | Freq: Every day | ORAL | 3 refills | Status: DC

## 2017-03-17 MED ORDER — ATORVASTATIN CALCIUM 20 MG PO TABS: 20.0000 mg | ORAL_TABLET | Freq: Every day | ORAL | 3 refills | Status: DC

## 2017-05-06 ENCOUNTER — Ambulatory Visit (HOSPITAL_COMMUNITY)
Admission: RE | Admit: 2017-05-06 | Discharge: 2017-05-06 | Disposition: A | Discharge status: Home / Self Care | Payer: BLUE CROSS/BLUE SHIELD | Source: Ambulatory Visit | Attending: Cardiology | Admitting: Cardiology

## 2017-05-06 ENCOUNTER — Encounter (HOSPITAL_COMMUNITY): Payer: Self-pay | Admitting: Cardiology

## 2017-05-06 VITALS — BP 104/68 | HR 79 | Wt 157.2 lb

## 2017-05-06 DIAGNOSIS — I429 Cardiomyopathy, unspecified: Secondary | ICD-10-CM | POA: Insufficient documentation

## 2017-05-06 DIAGNOSIS — Z7982 Long term (current) use of aspirin: Secondary | ICD-10-CM | POA: Diagnosis not present

## 2017-05-06 DIAGNOSIS — I5022 Chronic systolic (congestive) heart failure: Secondary | ICD-10-CM | POA: Insufficient documentation

## 2017-05-06 DIAGNOSIS — I110 Hypertensive heart disease with heart failure: Secondary | ICD-10-CM | POA: Insufficient documentation

## 2017-05-06 DIAGNOSIS — I2510 Atherosclerotic heart disease of native coronary artery without angina pectoris: Secondary | ICD-10-CM | POA: Insufficient documentation

## 2017-05-06 DIAGNOSIS — E785 Hyperlipidemia, unspecified: Secondary | ICD-10-CM | POA: Insufficient documentation

## 2017-05-06 DIAGNOSIS — Z79899 Other long term (current) drug therapy: Secondary | ICD-10-CM | POA: Diagnosis not present

## 2017-05-06 DIAGNOSIS — F17210 Nicotine dependence, cigarettes, uncomplicated: Secondary | ICD-10-CM | POA: Diagnosis not present

## 2017-05-06 LAB — LIPID PANEL
CHOL/HDL RATIO: 2.3 ratio
CHOLESTEROL: 165 mg/dL (ref 0–200)
HDL: 71 mg/dL (ref >40)
LDL Cholesterol: UNDETERMINED mg/dL (ref 0–99)
TRIGLYCERIDES: 401 mg/dL — AB (ref <150)
VLDL: UNDETERMINED mg/dL (ref 0–40)

## 2017-05-06 LAB — BASIC METABOLIC PANEL
ANION GAP: 7 (ref 5–15)
BUN: 17 mg/dL (ref 6–20)
CALCIUM: 8.9 mg/dL (ref 8.9–10.3)
CO2: 25 mmol/L (ref 22–32)
Chloride: 106 mmol/L (ref 101–111)
Creatinine, Ser: 1.12 mg/dL (ref 0.61–1.24)
GLUCOSE: 110 mg/dL — AB (ref 65–99)
POTASSIUM: 3.7 mmol/L (ref 3.5–5.1)
Sodium: 138 mmol/L (ref 135–145)

## 2017-05-06 NOTE — Patient Instructions (Signed)
Labs drawn today  Your physician recommends that you schedule a follow-up appointment in: 6 months with an ECHO  Your physician has requested that you have an echocardiogram. Echocardiography is a painless test that uses sound waves to create images of your heart. It provides your doctor with information about the size and shape of your heart and how well your heart's chambers and valves are working. This procedure takes approximately one hour. There are no restrictions for this procedure.  Marland Kitchen

## 2017-05-07 NOTE — Progress Notes (Signed)
Patient ID: [name], male   DOB: Nov 27, 1960, 56 y.o.   MRN: [id number] PCP: Dr. Nancy Fetter Cardiology: Dr. Aundra Dubin  56 yo returns for evaluation of nonischemic cardiomyopathy.  Patient presented to [facility] in 3/11 after 2-3 weeks of shortness of breath.  He had a flu-like illness with congestion and myalgias and had been treated with clarithromycin as an outpatient.  However, he became progressively short of breath so went into the hospital.  He was noted to have BNP 976 and pulmonary edema on CXR.  Echo showed EF 20-25%.  Left heart cath was done showing mild coronary disease, suggesting that this was likely a nonischemic cardiomyopathy.  Patient was diuresed and begun on cardiac meds.  Cardiac MRI showed EF 20% without significant delayed enhancement, so no evidence for infiltrative disease.  Echo was repeated 9/11, showing some improvement.  Would estimate EF at 40% with diffuse hypokinesis.  Echo in 4/14 showed EF 55% with septal hypokinesis.  5/16 echo also showed EF 50-55% range.   Echo in 7/17 showed EF back down to 30-35%.  He had not been taking his medications regularly (thinks he was missing about 50% of doses), now back to compliance with meds.  Cardiolite done in 8/17, showing EF 36% and possible inferior MI.  Repeat cardiac cath in 9/17 after abnormal Cardiolite showed diffuse but nonobstructive CAD.  Repeat echo in 2/18 showed EF back up to 50%.    He is doing well currently. Works as Presenter, broadcasting at [employer].  No significant exertional dyspnea. Push mows his lawn.  No chest pain.  No orthopnea/PND.  No palpitations.  No lightheadedness/dizziness. Weight stable.   He has mostly quit smoking, now using an e-cigarette.   Labs (3/11): UPEP/SPEP negative, ANA negative, urine drug screen negative, HIV negative, TSH normal, LDL 158, HDL 106, TGs 200, BNP 976, ESR 3, K 4, creatinine 1.01 Labs (4/11): BNP 212, K 4.2, creatinine 0.97 Labs (7/11): BNP 81, K 4.1, creatinine 0.9 Labs  (10/11): K 4.2, creatinine 0.8, LDL 98, HDL 68 Labs (1/12): K 3.8, creatinine 0.9, BNP 70 Labs (8/12): K 3.8, creatinine 0.9, HDL 90, LDL 48 Labs (3/14): K 3.5, creatinine 0.9 Labs (5/16): LDL 85 Labs (12/16): K 3.9, creatinine 0.86 Labs (7/17): LDL 55, HDL 146, BNP 24, K 3.7, creatinine 0.7 Labs (8/17): K 3.3, creatinine 0.83 Labs (9/17): K 3.9, creatinine 0.85 Labs (10/17): K 3.8, creatinine 1.05 Labs (5/18): K 3.6, creatinine 0.91  Allergies:  1)  ! Pcn  Past Medical History: 1. Nonischemic Cardiomyopathy: LHC (3/11) showed EF 15%, 40% ostial CFX, 40% prox RCA, 40-50% mid RCA.  Echo (3/11): Moderately dilated LV, EF 20-25%, global HK, severe diastolic dysfunction, mild RV dilation and dysfunction, moderate MR. SPEP/UPEP negative, ANA negative, HIV negative, TSH normal, no drug history.  Moderate ETOH use.  Highest suspicion probably viral myocarditis.   Cardiac MRI (4/11) showed a moderately dilated LV with global systolic dysfunction, EF 94%, normal RV size with moderate systolic dysfunction, small area of nonspecific subepicardial basal inferoseptal (RV insertion site) delayed enhancement.  Repeat echo (9/11): EF 40% with mild to moderate diffuse hypokinesis.  Echo (4/14) with EF 55%, septal hypokinesis, mild MR.  - Echo (5/16): EF 50-55%, moderate MR. - Echo (7/17): EF 30-35%, mild MR.   - LHC (9/17) with nonobstructive CAD.  - Echo (2/18): EF 50%, diffuse hypokinesis, normal RV size and systolic function.  2. Nonobstructive CAD:  - LHC (3/11) showed EF 15%, 40% ostial CFX, 40%  prox RCA, 40-50% mid RCA.  - Cardiolite (8/17) with EF 36%, possible inferior MI.  - LHC (9/17) with EF 45%, 50% ostial stenosis small AV LCx, diffuse RCA disease, 40% proximal/50% mid.  3. Smoking 4. Hyperlipidemia  Family History: The patient is adopted so does not know his family history.   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  smoked half pack per day  for over 30 years, now using e-cigarette and has mostly quit smoking.  Alcohol Use - yes, 3-4 drinks about 4 times a week in the past, has now cut back. Married, has two children.    ROS: All systems reviewed and negative except as per HPI.   Current Outpatient Prescriptions  Medication Sig Dispense Refill  . amLODipine (NORVASC) 5 MG tablet Take 1 tablet (5 mg total) by mouth daily. 90 tablet 3  . aspirin (ASPIR-81) 81 MG EC tablet Take 1 tablet (81 mg total) by mouth daily. 90 tablet 3  . atorvastatin (LIPITOR) 20 MG tablet Take 1 tablet (20 mg total) by mouth daily. 90 tablet 3  . carvedilol (COREG) 25 MG tablet Take 1 tablet (25 mg total) by mouth 2 (two) times daily with a meal. 180 tablet 3  . enalapril (VASOTEC) 20 MG tablet Take 1 tablet (20 mg total) by mouth 2 (two) times daily. 180 tablet 3  . isosorbide-hydrALAZINE (BIDIL) 20-37.5 MG tablet Take 1.5 tablets by mouth 3 (three) times daily. 135 tablet 11  . spironolactone (ALDACTONE) 25 MG tablet Take 1 tablet (25 mg total) by mouth daily. 90 tablet 3   No current facility-administered medications for this encounter.     BP 104/68   Pulse 79   Wt 157 lb 4 oz (71.3 kg)   SpO2 100%   BMI 23.91 kg/m  General: NAD Neck: No JVD, no thyromegaly or thyroid nodule.  Lungs: Clear to auscultation bilaterally with normal respiratory effort. CV: Nondisplaced PMI.  Heart regular S1/S2, no S3/S4, no murmur.  No peripheral edema.  No carotid bruit.  Normal pedal pulses.  Abdomen: Soft, nontender, no hepatosplenomegaly, no distention.  Skin: Intact without lesions or rashes.  Neurologic: Alert and oriented x 3.  Psych: Normal affect. Extremities: No clubbing or cyanosis.  HEENT: Normal.    Assessment/Plan: 1. Chronic systolic CHF: History of nonischemic cardiomyopathy, had recovered to near normal on 2016 echo but EF down to 30-35% on 7/17 echo.  Repeat LHC in 9/17 showed diffuse but nonobstructive CAD.  He is not volume overloaded on  exam, NYHA I-II symptoms (primarily has some fatigue).  He had not been particularly compliant with his cardiac meds prior to 7/17 echo, and he had been drinking somewhat more than in the past.   He has now cut back on ETOH and is taking all his meds. EF up to 50% on 2/18 echo.  - Continue current Coreg, enalapril, and spironolactone.  BMET today and again in 3 months.   - Continue Bidil 1.5 tabs tid.   - He is now out of ICD range.   - Repeat echo at followup in 6 months to make sure EF remains stable to improved.  2. HTN: BP controlled.    3. Hyperlipidemia: Check lipids.    4. Smoking: He has mostly quit and is using an e-cigarette.  I recommended that he taper off this.  5. CAD: Diffuse nonobstructive disease on 9/17 cath. Continue ASA 81 and atorvastatin with goal LDL < 70.  Followup in 6 months.   Loralie Champagne 05/07/2017

## 2017-05-10 ENCOUNTER — Telehealth (HOSPITAL_COMMUNITY): Payer: Self-pay

## 2017-05-10 NOTE — Telephone Encounter (Signed)
Basic Metabolic Panel (BMET)  Order: [id number]  Status:  Final result Visible to patient:  No (Not Released) Dx:  Chronic systolic CHF (congestive hear...  Notes recorded by Chyrl Civatte, RN on 05/10/2017 at 2:26 PM EDT LVMTCB. ------  Notes recorded by Laurey Morale, MD on 05/06/2017 at 3:43 PM EDT Triglycerides are very high. Is he still taking atorvastatin 20 mg daily? Take fish oil 2 g bid (can use over the counter) and needs to cut fats/cholesterol out of diet. Repeat lipids in 3 months.

## 2017-05-11 NOTE — Telephone Encounter (Signed)
Basic Metabolic Panel (BMET)  Order: [id number]  Status:  Final result Visible to patient:  No (Not Released) Dx:  Chronic systolic CHF (congestive hear...  Notes recorded by Chyrl Civatte, RN on 05/11/2017 at 3:27 PM EDT LVMTCB ------  Notes recorded by Chyrl Civatte, RN on 05/10/2017 at 2:26 PM EDT LVMTCB. ------  Notes recorded by Laurey Morale, MD on 05/06/2017 at 3:43 PM EDT Triglycerides are very high. Is he still taking atorvastatin 20 mg daily? Take fish oil 2 g bid (can use over the counter) and needs to cut fats/cholesterol out of diet. Repeat lipids in 3 months.

## 2017-05-13 ENCOUNTER — Telehealth (HOSPITAL_COMMUNITY): Payer: Self-pay

## 2017-05-13 MED ORDER — FISH OIL BURP-LESS 1000 MG PO CAPS: 2000.0000 mg | ORAL_CAPSULE | Freq: Two times a day (BID) | ORAL | 6 refills | Status: DC

## 2017-05-13 NOTE — Telephone Encounter (Signed)
Basic Metabolic Panel (BMET)  Order: [id number]  Status:  Final result Visible to patient:  No (Not Released) Dx:  Chronic systolic CHF (congestive hear...  Notes recorded by Chyrl Civatte, RN on 05/13/2017 at 11:21 AM EDT Patient reports compliance with atorvastatin 20 mg once daily. Advised to take 2 g fish oil twice daily and educated on diet and will follow up with labs in 3 months, lab apt made. ------  Notes recorded by Chyrl Civatte, RN on 05/11/2017 at 3:27 PM EDT LVMTCB ------  Notes recorded by Chyrl Civatte, RN on 05/10/2017 at 2:26 PM EDT LVMTCB. ------  Notes recorded by Laurey Morale, MD on 05/06/2017 at 3:43 PM EDT Triglycerides are very high. Is he still taking atorvastatin 20 mg daily? Take fish oil 2 g bid (can use over the counter) and needs to cut fats/cholesterol out of diet. Repeat lipids in 3 months.

## 2017-07-21 ENCOUNTER — Other Ambulatory Visit (HOSPITAL_COMMUNITY): Payer: Self-pay

## 2017-07-21 DIAGNOSIS — I5022 Chronic systolic (congestive) heart failure: Secondary | ICD-10-CM

## 2017-07-21 MED ORDER — ATORVASTATIN CALCIUM 20 MG PO TABS: 20.0000 mg | ORAL_TABLET | Freq: Every day | ORAL | 3 refills | Status: DC

## 2017-07-21 MED ORDER — AMLODIPINE BESYLATE 5 MG PO TABS: 5.0000 mg | ORAL_TABLET | Freq: Every day | ORAL | 3 refills | Status: DC

## 2017-07-21 MED ORDER — ISOSORB DINITRATE-HYDRALAZINE 20-37.5 MG PO TABS: 1.5000 | ORAL_TABLET | Freq: Three times a day (TID) | ORAL | 11 refills | Status: DC

## 2017-07-21 MED ORDER — SPIRONOLACTONE 25 MG PO TABS: 25.0000 mg | ORAL_TABLET | Freq: Every day | ORAL | 3 refills | Status: DC

## 2017-07-21 MED ORDER — CARVEDILOL 25 MG PO TABS: 25.0000 mg | ORAL_TABLET | Freq: Two times a day (BID) | ORAL | 3 refills | Status: DC

## 2017-07-21 MED ORDER — FISH OIL BURP-LESS 1000 MG PO CAPS: 2000.0000 mg | ORAL_CAPSULE | Freq: Two times a day (BID) | ORAL | 6 refills | Status: DC

## 2017-08-11 ENCOUNTER — Other Ambulatory Visit (HOSPITAL_COMMUNITY): Payer: Self-pay | Admitting: *Deleted

## 2017-08-11 ENCOUNTER — Ambulatory Visit (HOSPITAL_COMMUNITY)
Admission: RE | Admit: 2017-08-11 | Discharge: 2017-08-11 | Disposition: A | Discharge status: Home / Self Care | Payer: BLUE CROSS/BLUE SHIELD | Source: Ambulatory Visit | Attending: Internal Medicine | Admitting: Internal Medicine

## 2017-08-11 DIAGNOSIS — I5022 Chronic systolic (congestive) heart failure: Secondary | ICD-10-CM | POA: Diagnosis not present

## 2017-08-11 LAB — LIPID PANEL
CHOLESTEROL: 179 mg/dL (ref 0–200)
HDL: 73 mg/dL (ref >40)
LDL Cholesterol: 68 mg/dL (ref 0–99)
TRIGLYCERIDES: 188 mg/dL — AB (ref <150)
Total CHOL/HDL Ratio: 2.5 RATIO
VLDL: 38 mg/dL (ref 0–40)

## 2017-08-17 ENCOUNTER — Encounter (HOSPITAL_COMMUNITY): Payer: Self-pay

## 2017-08-23 ENCOUNTER — Other Ambulatory Visit (HOSPITAL_COMMUNITY): Payer: Self-pay | Admitting: *Deleted

## 2017-08-23 MED ORDER — ENALAPRIL MALEATE 20 MG PO TABS: 20.0000 mg | ORAL_TABLET | Freq: Two times a day (BID) | ORAL | 0 refills | Status: DC

## 2018-02-14 ENCOUNTER — Other Ambulatory Visit (HOSPITAL_COMMUNITY): Payer: Self-pay | Admitting: Cardiology

## 2018-06-20 ENCOUNTER — Ambulatory Visit (HOSPITAL_COMMUNITY): Admission: RE | Admit: 2018-06-20 | Payer: BLUE CROSS/BLUE SHIELD | Source: Ambulatory Visit

## 2018-06-20 ENCOUNTER — Encounter (HOSPITAL_COMMUNITY): Payer: 59 | Admitting: Cardiology

## 2018-09-15 ENCOUNTER — Encounter (HOSPITAL_COMMUNITY): Payer: Self-pay | Admitting: Cardiology

## 2018-09-15 ENCOUNTER — Ambulatory Visit (HOSPITAL_BASED_OUTPATIENT_CLINIC_OR_DEPARTMENT_OTHER)
Admission: RE | Admit: 2018-09-15 | Discharge: 2018-09-15 | Disposition: A | Discharge status: Home / Self Care | Payer: 59 | Source: Ambulatory Visit | Attending: Cardiology | Admitting: Cardiology

## 2018-09-15 ENCOUNTER — Ambulatory Visit (HOSPITAL_COMMUNITY)
Admission: RE | Admit: 2018-09-15 | Discharge: 2018-09-15 | Disposition: A | Discharge status: Home / Self Care | Payer: 59 | Source: Ambulatory Visit | Attending: Family Medicine | Admitting: Family Medicine

## 2018-09-15 VITALS — BP 120/76 | HR 70 | Wt 162.0 lb

## 2018-09-15 DIAGNOSIS — F17200 Nicotine dependence, unspecified, uncomplicated: Secondary | ICD-10-CM

## 2018-09-15 DIAGNOSIS — I110 Hypertensive heart disease with heart failure: Secondary | ICD-10-CM | POA: Insufficient documentation

## 2018-09-15 DIAGNOSIS — Z7982 Long term (current) use of aspirin: Secondary | ICD-10-CM | POA: Insufficient documentation

## 2018-09-15 DIAGNOSIS — I2510 Atherosclerotic heart disease of native coronary artery without angina pectoris: Secondary | ICD-10-CM

## 2018-09-15 DIAGNOSIS — F17290 Nicotine dependence, other tobacco product, uncomplicated: Secondary | ICD-10-CM | POA: Diagnosis not present

## 2018-09-15 DIAGNOSIS — I5022 Chronic systolic (congestive) heart failure: Secondary | ICD-10-CM

## 2018-09-15 DIAGNOSIS — I428 Other cardiomyopathies: Secondary | ICD-10-CM | POA: Insufficient documentation

## 2018-09-15 DIAGNOSIS — E785 Hyperlipidemia, unspecified: Secondary | ICD-10-CM | POA: Diagnosis not present

## 2018-09-15 DIAGNOSIS — Z79899 Other long term (current) drug therapy: Secondary | ICD-10-CM | POA: Diagnosis not present

## 2018-09-15 LAB — LIPID PANEL
CHOLESTEROL: 208 mg/dL — AB (ref 0–200)
HDL: 71 mg/dL (ref >40)
LDL Cholesterol: 101 mg/dL — ABNORMAL HIGH (ref 0–99)
TRIGLYCERIDES: 178 mg/dL — AB (ref <150)
Total CHOL/HDL Ratio: 2.9 RATIO
VLDL: 36 mg/dL (ref 0–40)

## 2018-09-15 LAB — CBC
HCT: 40.7 % (ref 39.0–52.0)
HEMOGLOBIN: 12.9 g/dL — AB (ref 13.0–17.0)
MCH: 26.4 pg (ref 26.0–34.0)
MCHC: 31.7 g/dL (ref 30.0–36.0)
MCV: 83.2 fL (ref 80.0–100.0)
PLATELETS: 226 10*3/uL (ref 150–400)
RBC: 4.89 MIL/uL (ref 4.22–5.81)
RDW: 13.5 % (ref 11.5–15.5)
WBC: 5.7 10*3/uL (ref 4.0–10.5)
nRBC: 0 % (ref 0.0–0.2)

## 2018-09-15 LAB — BASIC METABOLIC PANEL
ANION GAP: 9 (ref 5–15)
BUN: 9 mg/dL (ref 6–20)
CALCIUM: 9.5 mg/dL (ref 8.9–10.3)
CO2: 22 mmol/L (ref 22–32)
Chloride: 103 mmol/L (ref 98–111)
Creatinine, Ser: 0.87 mg/dL (ref 0.61–1.24)
GFR calc Af Amer: >60 mL/min (ref >60)
GLUCOSE: 107 mg/dL — AB (ref 70–99)
POTASSIUM: 4 mmol/L (ref 3.5–5.1)
SODIUM: 134 mmol/L — AB (ref 135–145)

## 2018-09-15 MED ORDER — ISOSORB DINITRATE-HYDRALAZINE 20-37.5 MG PO TABS: 1.0000 | ORAL_TABLET | Freq: Three times a day (TID) | ORAL | 11 refills | Status: DC

## 2018-09-15 NOTE — Patient Instructions (Signed)
Labs done today  RESTART Bidil 1 tablet three times daily  STOP Amlodipine  Follow up with Dr. Shirlee Latch in 3 months

## 2018-09-15 NOTE — Progress Notes (Signed)
1 bottle of samples given: Bidil  Lot #[id number] Expiration:9/21

## 2018-09-15 NOTE — Progress Notes (Signed)
  Echocardiogram 2D Echocardiogram has been performed.  Delcie Roch 09/15/2018, 10:51 AM

## 2018-09-17 NOTE — Progress Notes (Signed)
Patient ID: [name], male   DOB: June 06, 1961, 58 y.o.   MRN: [id number] PCP: Dr. Nancy Fetter Cardiology: Dr. Aundra Dubin  58 y.o. returns for evaluation of nonischemic cardiomyopathy.  Patient presented to [facility] in 3/11 after 2-3 weeks of shortness of breath.  He had a flu-like illness with congestion and myalgias and had been treated with clarithromycin as an outpatient.  However, he became progressively short of breath so went into the hospital.  He was noted to have BNP 976 and pulmonary edema on CXR.  Echo showed EF 20-25%.  Left heart cath was done showing mild coronary disease, suggesting that this was likely a nonischemic cardiomyopathy.  Patient was diuresed and begun on cardiac meds.  Cardiac MRI showed EF 20% without significant delayed enhancement, so no evidence for infiltrative disease.  Echo was repeated 9/11, showing some improvement.  Would estimate EF at 40% with diffuse hypokinesis.  Echo in 4/14 showed EF 55% with septal hypokinesis.  5/16 echo also showed EF 50-55% range.   Echo in 7/17 showed EF back down to 30-35%.  He had not been taking his medications regularly (thinks he was missing about 50% of doses).  Cardiolite done in 8/17, showing EF 36% and possible inferior MI.  Repeat cardiac cath in 9/17 after abnormal Cardiolite showed diffuse but nonobstructive CAD.  Repeat echo in 2/18 showed EF back up to 50%.    Echo was done today and reviewed, EF 40-45% with normal RV size and systolic function.   No complaints today.  He walks his dog and does some jogging, overall about 30-40 minutes of exercise a day. No exertional dyspnea or chest pain.  No orthopnea/PND.  He still smokes 2-3 cigarettes/day.  He occasional drinks ETOH but not heavily. He had occasional lightheadedness after taking Bidil (was on 1.5 tabs tid) so he stopped it and has been off for about a year.     Labs (3/11): UPEP/SPEP negative, ANA negative, urine drug screen negative, HIV negative, TSH normal, LDL 158, HDL  106, TGs 200, BNP 976, ESR 3, K 4, creatinine 1.01 Labs (4/11): BNP 212, K 4.2, creatinine 0.97 Labs (7/11): BNP 81, K 4.1, creatinine 0.9 Labs (10/11): K 4.2, creatinine 0.8, LDL 98, HDL 68 Labs (1/12): K 3.8, creatinine 0.9, BNP 70 Labs (8/12): K 3.8, creatinine 0.9, HDL 90, LDL 48 Labs (3/14): K 3.5, creatinine 0.9 Labs (5/16): LDL 85 Labs (12/16): K 3.9, creatinine 0.86 Labs (7/17): LDL 55, HDL 146, BNP 24, K 3.7, creatinine 0.7 Labs (8/17): K 3.3, creatinine 0.83 Labs (9/17): K 3.9, creatinine 0.85 Labs (10/17): K 3.8, creatinine 1.05 Labs (5/18): K 3.6, creatinine 0.91 Labs (8/18): creatinine 1.12 Labs (12/18): LDL 68  Allergies:  1)  ! Pcn  Past Medical History: 1. Nonischemic Cardiomyopathy: LHC (3/11) showed EF 15%, 40% ostial CFX, 40% prox RCA, 40-50% mid RCA.  Echo (3/11): Moderately dilated LV, EF 20-25%, global HK, severe diastolic dysfunction, mild RV dilation and dysfunction, moderate MR. SPEP/UPEP negative, ANA negative, HIV negative, TSH normal, no drug history.  Moderate ETOH use.  Highest suspicion probably viral myocarditis.   Cardiac MRI (4/11) showed a moderately dilated LV with global systolic dysfunction, EF 74%, normal RV size with moderate systolic dysfunction, small area of nonspecific subepicardial basal inferoseptal (RV insertion site) delayed enhancement.  Repeat echo (9/11): EF 40% with mild to moderate diffuse hypokinesis.  Echo (4/14) with EF 55%, septal hypokinesis, mild MR.  - Echo (5/16): EF 50-55%, moderate MR. - Echo (7/17):  EF 30-35%, mild MR.   - LHC (9/17) with nonobstructive CAD.  - Echo (2/18): EF 50%, diffuse hypokinesis, normal RV size and systolic function.  - Echo (1/20): EF 40-45%, normal RV size and systolic function.  2. Nonobstructive CAD:  - LHC (3/11) showed EF 15%, 40% ostial CFX, 40% prox RCA, 40-50% mid RCA.  - Cardiolite (8/17) with EF 36%, possible inferior MI.  - LHC (9/17) with EF 45%, 50% ostial stenosis small AV LCx, diffuse  RCA disease, 40% proximal/50% mid.  3. Smoking 4. Hyperlipidemia  Family History: The patient is adopted so does not know his family history.   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  smoked half pack per day for over 30 years, now using e-cigarette and has mostly quit smoking.  Alcohol Use - yes, 3-4 drinks about 4 times a week in the past, has now cut back. Married, has two children.    ROS: All systems reviewed and negative except as per HPI.   Current Outpatient Medications  Medication Sig Dispense Refill  . aspirin (ASPIR-81) 81 MG EC tablet Take 1 tablet (81 mg total) by mouth daily. 90 tablet 3  . atorvastatin (LIPITOR) 20 MG tablet Take 1 tablet (20 mg total) daily by mouth. 90 tablet 3  . carvedilol (COREG) 25 MG tablet Take 1 tablet (25 mg total) 2 (two) times daily with a meal by mouth. 180 tablet 3  . enalapril (VASOTEC) 20 MG tablet Take 1 tablet (20 mg total) by mouth 2 (two) times daily. 14 tablet 0  . isosorbide-hydrALAZINE (BIDIL) 20-37.5 MG tablet Take 1 tablet by mouth 3 (three) times daily. 135 tablet 11  . Omega-3 Fatty Acids (FISH OIL BURP-LESS) 1000 MG CAPS Take 2,000 mg 2 (two) times daily by mouth. 120 capsule 6  . spironolactone (ALDACTONE) 25 MG tablet Take 1 tablet (25 mg total) daily by mouth. 90 tablet 3   No current facility-administered medications for this encounter.     BP 120/76   Pulse 70   Wt 73.5 kg (162 lb)   SpO2 99%   BMI 24.63 kg/m  General: NAD Neck: No JVD, no thyromegaly or thyroid nodule.  Lungs: Clear to auscultation bilaterally with normal respiratory effort. CV: Nondisplaced PMI.  Heart regular S1/S2, no S3/S4, no murmur.  No peripheral edema.  No carotid bruit.  Normal pedal pulses.  Abdomen: Soft, nontender, no hepatosplenomegaly, no distention.  Skin: Intact without lesions or rashes.  Neurologic: Alert and oriented x 3.  Psych: Normal affect. Extremities: No clubbing or  cyanosis.  HEENT: Normal.   Assessment/Plan: 1. Chronic systolic CHF: History of nonischemic cardiomyopathy, had recovered to near normal on 2016 echo but EF down to 30-35% on 7/17 echo.  Repeat LHC in 9/17 showed diffuse but nonobstructive CAD.  EF was up to 50% on 2/18 echo, but is down some on today's echo to 40-45%.  He is no longer taking Bidil.  He is not volume overloaded on exam, NYHA class I.   - Continue current Coreg, enalapril, and spironolactone.  BMET today and needs every 3 months.  We discussed need for compliance with labwork. - I will have him stop amlodipine and go back on lower dose of Bidil, 1 tab tid.  - He remains out of ICD range.   2. HTN: BP controlled.    3. Hyperlipidemia: Check lipids.    4. Smoking: Still smoking 2-3 cigs/day, strongly recommended that he quit.  5.  CAD: Diffuse nonobstructive disease on 9/17 cath. Continue ASA 81 and atorvastatin with goal LDL < 70.   Followup in 3 months.   Loralie Champagne 09/17/2018

## 2018-11-14 ENCOUNTER — Other Ambulatory Visit (HOSPITAL_COMMUNITY): Payer: Self-pay

## 2018-11-14 ENCOUNTER — Ambulatory Visit (HOSPITAL_COMMUNITY)
Admission: RE | Admit: 2018-11-14 | Discharge: 2018-11-14 | Disposition: A | Discharge status: Home / Self Care | Payer: 59 | Source: Ambulatory Visit | Attending: Cardiology | Admitting: Cardiology

## 2018-11-14 ENCOUNTER — Telehealth (HOSPITAL_COMMUNITY): Payer: Self-pay

## 2018-11-14 VITALS — BP 144/68

## 2018-11-14 DIAGNOSIS — E785 Hyperlipidemia, unspecified: Secondary | ICD-10-CM | POA: Diagnosis not present

## 2018-11-14 DIAGNOSIS — I5022 Chronic systolic (congestive) heart failure: Secondary | ICD-10-CM

## 2018-11-14 LAB — HEPATIC FUNCTION PANEL
ALT: 16 U/L (ref 0–44)
AST: 17 U/L (ref 15–41)
Albumin: 3.9 g/dL (ref 3.5–5.0)
Alkaline Phosphatase: 64 U/L (ref 38–126)
BILIRUBIN TOTAL: 0.3 mg/dL (ref 0.3–1.2)
Total Protein: 6.5 g/dL (ref 6.5–8.1)

## 2018-11-14 LAB — LIPID PANEL
Cholesterol: 175 mg/dL (ref 0–200)
HDL: 58 mg/dL (ref >40)
LDL CALC: 48 mg/dL (ref 0–99)
Total CHOL/HDL Ratio: 3 RATIO
Triglycerides: 347 mg/dL — ABNORMAL HIGH (ref <150)
VLDL: 69 mg/dL — ABNORMAL HIGH (ref 0–40)

## 2018-11-14 MED ORDER — SPIRONOLACTONE 25 MG PO TABS: 25.0000 mg | ORAL_TABLET | Freq: Every day | ORAL | 3 refills | Status: DC

## 2018-11-14 MED ORDER — ICOSAPENT ETHYL 1 G PO CAPS: 2.0000 g | ORAL_CAPSULE | Freq: Two times a day (BID) | ORAL | 6 refills | Status: DC

## 2018-11-14 MED ORDER — ATORVASTATIN CALCIUM 20 MG PO TABS: 20.0000 mg | ORAL_TABLET | Freq: Every day | ORAL | 3 refills | Status: DC

## 2018-11-14 NOTE — Telephone Encounter (Signed)
Relayed message to pt, agreeable to begin Vascepa 2g twice a day which was sent escript per his request, repeat lab scheduled 11 may @ 830am

## 2018-11-14 NOTE — Addendum Note (Signed)
Encounter addended by: Theresia Bough, CMA on: 11/14/2018 9:11 AM  Actions taken: Vitals modified

## 2018-11-14 NOTE — Telephone Encounter (Signed)
-----   Message from Laurey Morale, MD sent at 11/14/2018  1:40 PM EDT ----- Triglycerides very high.  Start Vascepa 2 g bid, repeat lipids in 2 months.

## 2019-01-15 ENCOUNTER — Other Ambulatory Visit (HOSPITAL_COMMUNITY): Payer: Self-pay

## 2019-01-15 ENCOUNTER — Inpatient Hospital Stay (HOSPITAL_COMMUNITY): Admission: RE | Admit: 2019-01-15 | Payer: 59 | Source: Ambulatory Visit

## 2019-01-15 DIAGNOSIS — I5022 Chronic systolic (congestive) heart failure: Secondary | ICD-10-CM

## 2019-02-14 ENCOUNTER — Other Ambulatory Visit (HOSPITAL_COMMUNITY): Payer: Self-pay

## 2019-02-14 DIAGNOSIS — I5022 Chronic systolic (congestive) heart failure: Secondary | ICD-10-CM

## 2019-02-14 MED ORDER — CARVEDILOL 25 MG PO TABS: 25.0000 mg | ORAL_TABLET | Freq: Two times a day (BID) | ORAL | 3 refills | Status: DC

## 2019-04-11 ENCOUNTER — Other Ambulatory Visit: Payer: Self-pay

## 2019-04-11 DIAGNOSIS — Z20822 Contact with and (suspected) exposure to covid-19: Secondary | ICD-10-CM

## 2019-04-12 LAB — NOVEL CORONAVIRUS, NAA: SARS-CoV-2, NAA: NOT DETECTED

## 2019-04-16 ENCOUNTER — Telehealth: Payer: Self-pay | Admitting: General Practice

## 2019-04-16 NOTE — Telephone Encounter (Signed)
Negative COVID results given. Patient results "NOT Detected." Caller expressed understanding. ° °

## 2019-07-19 ENCOUNTER — Other Ambulatory Visit (HOSPITAL_COMMUNITY): Payer: Self-pay | Admitting: Cardiology

## 2019-07-19 MED ORDER — SPIRONOLACTONE 25 MG PO TABS: 25.0000 mg | ORAL_TABLET | Freq: Every day | ORAL | 0 refills | Status: DC

## 2019-08-10 ENCOUNTER — Other Ambulatory Visit (HOSPITAL_COMMUNITY): Payer: Self-pay

## 2019-08-10 DIAGNOSIS — I5022 Chronic systolic (congestive) heart failure: Secondary | ICD-10-CM

## 2019-08-10 MED ORDER — ATORVASTATIN CALCIUM 20 MG PO TABS: 20.0000 mg | ORAL_TABLET | Freq: Every day | ORAL | 0 refills | Status: DC

## 2019-08-10 NOTE — Telephone Encounter (Signed)
Pt called asking for refill for lipitor until 90 supply gets delivered. He ran out of current supply as he was taking it incorrectly. Pt was taking it twice a day.  Advised script is written for once daily.  Verbalized understanding.

## 2019-09-01 ENCOUNTER — Other Ambulatory Visit: Payer: Self-pay

## 2019-09-01 ENCOUNTER — Encounter (HOSPITAL_COMMUNITY): Payer: Self-pay | Admitting: Emergency Medicine

## 2019-09-01 ENCOUNTER — Emergency Department (HOSPITAL_COMMUNITY)
Admission: EM | Admit: 2019-09-01 | Discharge: 2019-09-01 | Disposition: A | Discharge status: Home / Self Care | Payer: 59 | Attending: Emergency Medicine | Admitting: Emergency Medicine

## 2019-09-01 ENCOUNTER — Emergency Department (HOSPITAL_COMMUNITY): Payer: 59

## 2019-09-01 DIAGNOSIS — I5022 Chronic systolic (congestive) heart failure: Secondary | ICD-10-CM | POA: Diagnosis not present

## 2019-09-01 DIAGNOSIS — Z7982 Long term (current) use of aspirin: Secondary | ICD-10-CM | POA: Insufficient documentation

## 2019-09-01 DIAGNOSIS — I110 Hypertensive heart disease with heart failure: Secondary | ICD-10-CM | POA: Insufficient documentation

## 2019-09-01 DIAGNOSIS — F17210 Nicotine dependence, cigarettes, uncomplicated: Secondary | ICD-10-CM | POA: Diagnosis not present

## 2019-09-01 DIAGNOSIS — I2510 Atherosclerotic heart disease of native coronary artery without angina pectoris: Secondary | ICD-10-CM | POA: Insufficient documentation

## 2019-09-01 DIAGNOSIS — R42 Dizziness and giddiness: Secondary | ICD-10-CM

## 2019-09-01 DIAGNOSIS — R0602 Shortness of breath: Secondary | ICD-10-CM | POA: Diagnosis not present

## 2019-09-01 LAB — BASIC METABOLIC PANEL
Anion gap: 10 (ref 5–15)
BUN: 13 mg/dL (ref 6–20)
CO2: 25 mmol/L (ref 22–32)
Calcium: 9 mg/dL (ref 8.9–10.3)
Chloride: 103 mmol/L (ref 98–111)
Creatinine, Ser: 1.04 mg/dL (ref 0.61–1.24)
GFR calc Af Amer: >60 mL/min (ref >60)
GFR calc non Af Amer: >60 mL/min (ref >60)
Glucose, Bld: 116 mg/dL — ABNORMAL HIGH (ref 70–99)
Potassium: 4 mmol/L (ref 3.5–5.1)
Sodium: 138 mmol/L (ref 135–145)

## 2019-09-01 LAB — CBC
HCT: 38.8 % — ABNORMAL LOW (ref 39.0–52.0)
Hemoglobin: 12.8 g/dL — ABNORMAL LOW (ref 13.0–17.0)
MCH: 27.6 pg (ref 26.0–34.0)
MCHC: 33 g/dL (ref 30.0–36.0)
MCV: 83.6 fL (ref 80.0–100.0)
Platelets: 227 10*3/uL (ref 150–400)
RBC: 4.64 MIL/uL (ref 4.22–5.81)
RDW: 15 % (ref 11.5–15.5)
WBC: 5.7 10*3/uL (ref 4.0–10.5)
nRBC: 0 % (ref 0.0–0.2)

## 2019-09-01 LAB — TROPONIN I (HIGH SENSITIVITY)
Troponin I (High Sensitivity): 7 ng/L (ref <18)
Troponin I (High Sensitivity): 6 ng/L (ref <18)

## 2019-09-01 LAB — BRAIN NATRIURETIC PEPTIDE: B Natriuretic Peptide: 42.4 pg/mL (ref 0.0–100.0)

## 2019-09-01 MED ORDER — SODIUM CHLORIDE 0.9% FLUSH: 3.0000 mL | Freq: Once | INTRAVENOUS | Status: DC

## 2019-09-01 NOTE — ED Triage Notes (Addendum)
C/o feeling lightheaded, diaphoresis, and L arm tingling since yesterday.  Denies pain.  No arm drift.  Reports generalized weakness

## 2019-09-01 NOTE — ED Notes (Signed)
Patient transported to CT 

## 2019-09-01 NOTE — Discharge Instructions (Signed)
Please drink plenty of fluids Follow up with Dr. Aundra Dubin Please return if you are worsening

## 2019-09-01 NOTE — ED Provider Notes (Signed)
[facility] EMERGENCY DEPARTMENT Provider Note   CSN: [id number] Arrival date & time: 09/01/19  2376     History Chief Complaint  Patient presents with  . Dizziness  . arm tingling    [name] is a 58 y.o. male with history of nonischemic cardiomyopathy and non-obstructive CAD presents with lightheadedness.  Patient states that he worked yesterday and felt fine.  This morning he woke up around 4 AM and was watching TV.  When he got up from sitting he felt acutely lightheaded and became diaphoretic, and felt like his left arm was tingling.  Due to his heart history he decided to come to the ED to get checked out.  He denies any chest pain or shortness of breath with this episode.  It lasted for approximately 4 hours.  He has been taking his medicines regularly.  He denies loss of consciousness, shortness of breath, fever, cough, abdominal pain, nausea, vomiting, leg swelling.  He took an aspirin earlier this morning.  He reports having a little bit of arm tingling at this time but otherwise is asymptomatic.  HPI     Past Medical History:  Diagnosis Date  . CAD (coronary artery disease)    nonobstructive: LHC as above with mild disease  . Chronic systolic heart failure (HCC)   . History of echocardiogram    Echo 5/16:  EF 50-55%, diff HK, Gr 1 DD, mod MR, LA upper limits normal, mobile atrial septum (cannot r/p PFO)  . HLD (hyperlipidemia)   . Nonischemic cardiomyopathy (HCC)    LHC 3/11 showed EF 15%, 40% ostial CFX, 40% prox RCA, 40-50% mid RCa. Echo 3/11 moderate dilated LV, EF 20-25% global hk  . Personal history of unspecified circulatory disease     Patient Active Problem List   Diagnosis Date Noted  . Essential hypertension 10/10/2015  . Hyperlipidemia 10/10/2015  . Mitral regurgitation 10/10/2015  . SMOKER 11/26/2009  . CAD (coronary artery disease) 11/26/2009  . Chronic systolic CHF (congestive heart failure) (HCC) 11/26/2009    Past  Surgical History:  Procedure Laterality Date  . CARDIAC CATHETERIZATION     6 years ago  . CARDIAC CATHETERIZATION N/A 05/26/2016   Procedure: Left Heart Cath and Coronary Angiography;  Surgeon: Laurey Morale, MD;  Location: [facility] INVASIVE CV LAB;  Service: Cardiovascular;  Laterality: N/A;       Family History  Adopted: Yes  Family history unknown: Yes    Social History   Tobacco Use  . Smoking status: Current Every Day Smoker    Types: Cigarettes  . Smokeless tobacco: Never Used  . Tobacco comment: smoked 1/2 ppd for over 30 years, now down to 3-4 cigs/day  Substance Use Topics  . Alcohol use: Yes    Comment: usu liquor three times a week (3-4 drinks at a time)   . Drug use: Not Currently    Home Medications Prior to Admission medications   Medication Sig Start Date End Date Taking? Authorizing Provider  aspirin (ASPIR-81) 81 MG EC tablet Take 1 tablet (81 mg total) by mouth daily. 03/17/17   Bensimhon, Bevelyn Buckles, MD  atorvastatin (LIPITOR) 20 MG tablet Take 1 tablet (20 mg total) by mouth daily. 08/10/19   Laurey Morale, MD  carvedilol (COREG) 25 MG tablet Take 1 tablet (25 mg total) by mouth 2 (two) times daily with a meal. 02/14/19   Laurey Morale, MD  enalapril (VASOTEC) 20 MG tablet TAKE 1 TABLET TWICE A DAY (  NEEDS OFFICE VISIT [phone] ) 07/19/19   Laurey Morale, MD  Icosapent Ethyl 1 g CAPS Take 2 capsules (2 g total) by mouth 2 (two) times daily. 11/14/18   Laurey Morale, MD  isosorbide-hydrALAZINE (BIDIL) 20-37.5 MG tablet Take 1 tablet by mouth 3 (three) times daily. 09/15/18   Laurey Morale, MD  Omega-3 Fatty Acids (FISH OIL BURP-LESS) 1000 MG CAPS Take 2,000 mg 2 (two) times daily by mouth. 07/21/17   Laurey Morale, MD  spironolactone (ALDACTONE) 25 MG tablet Take 1 tablet (25 mg total) by mouth daily. 07/19/19 10/17/19  Laurey Morale, MD    Allergies    Penicillins  Review of Systems   Review of Systems  Constitutional: Positive for  diaphoresis. Negative for fever.  Respiratory: Negative for cough and shortness of breath.   Cardiovascular: Negative for chest pain.  Gastrointestinal: Negative for abdominal pain, nausea and vomiting.  Neurological: Positive for light-headedness and numbness. Negative for syncope, weakness and headaches.    Physical Exam Updated Vital Signs BP 138/83 (BP Location: Right Arm)   Pulse 95   Temp 98.3 F (36.8 C) (Oral)   Resp 18   SpO2 100%   Physical Exam Vitals and nursing note reviewed.  Constitutional:      General: He is not in acute distress.    Appearance: Normal appearance. He is well-developed. He is not ill-appearing.  HENT:     Head: Normocephalic and atraumatic.  Eyes:     General: No scleral icterus.       Right eye: No discharge.        Left eye: No discharge.     Conjunctiva/sclera: Conjunctivae normal.     Pupils: Pupils are equal, round, and reactive to light.  Cardiovascular:     Rate and Rhythm: Normal rate and regular rhythm.  Pulmonary:     Effort: Pulmonary effort is normal. No respiratory distress.     Breath sounds: Normal breath sounds.  Abdominal:     General: There is no distension.     Palpations: Abdomen is soft.     Tenderness: There is no abdominal tenderness.  Musculoskeletal:     Cervical back: Normal range of motion.     Right lower leg: No edema.     Left lower leg: No edema.  Skin:    General: Skin is warm and dry.  Neurological:     Mental Status: He is alert and oriented to person, place, and time.  Psychiatric:        Behavior: Behavior normal.     ED Results / Procedures / Treatments   Labs (all labs ordered are listed, but only abnormal results are displayed) Labs Reviewed  BASIC METABOLIC PANEL - Abnormal; Notable for the following components:      Result Value   Glucose, Bld 116 (*)    All other components within normal limits  CBC - Abnormal; Notable for the following components:   Hemoglobin 12.8 (*)    HCT 38.8  (*)    All other components within normal limits  BRAIN NATRIURETIC PEPTIDE  TROPONIN I (HIGH SENSITIVITY)  TROPONIN I (HIGH SENSITIVITY)    EKG EKG Interpretation  Date/Time:  Saturday September 01 2019 07:18:24 EST Ventricular Rate:  89 PR Interval:  142 QRS Duration: 140 QT Interval:  402 QTC Calculation: 489 R Axis:   97 Text Interpretation: Normal sinus rhythm Rightward axis Left ventricular hypertrophy with QRS widening and repolarization abnormality ( Cornell product )  Abnormal ECG Confirmed by Madalyn Rob [phone]) on 09/01/2019 11:10:23 AM   Radiology DG Chest Port 1 View  Result Date: 09/01/2019 CLINICAL DATA:  Shortness of breath since yesterday EXAM: PORTABLE CHEST 1 VIEW COMPARISON:  August 25, 2015 FINDINGS: The heart size and mediastinal contours are within normal limits. Both lungs are clear. The visualized skeletal structures are unremarkable. IMPRESSION: No active disease. Electronically Signed   By: Abelardo Diesel M.D.   On: 09/01/2019 12:56    Procedures Procedures (including critical care time)  Medications Ordered in ED Medications  sodium chloride flush (NS) 0.9 % injection 3 mL (has no administration in time range)    ED Course  I have reviewed the triage vital signs and the nursing notes.  Pertinent labs & imaging results that were available during my care of the patient were reviewed by me and considered in my medical decision making (see chart for details).  58 year old male presents with acute lightheadedness with diaphoresis and left arm tingling since this morning upon getting up from the couch. Vital are normal here. BP is low normal. Exam is unremarkable. EKG shows new LBBB. CBC shows mild anemia. BMP is normal. BNP is normal. 1st trop is 7 and 2nd is 6. He denies any chest pain. Will discuss with Cards.  Discussed with Dr. Curt Bears - reviewed work up. Feels symptoms are likely orthostatic. He has close f/u with Cards next Wednesday. BP is  soft here. Orthostatics are negative. He feels generally well. Will have him f/u. Pt is comfortable with plan.  MDM Rules/Calculators/A&P                       Final Clinical Impression(s) / ED Diagnoses Final diagnoses:  Positional lightheadedness    Rx / DC Orders ED Discharge Orders    None       Recardo Evangelist, PA-C 09/01/19 1358    Lucrezia Starch, MD 09/02/19 1434

## 2019-09-05 ENCOUNTER — Ambulatory Visit (HOSPITAL_COMMUNITY)
Admission: RE | Admit: 2019-09-05 | Discharge: 2019-09-05 | Disposition: A | Discharge status: Home / Self Care | Payer: 59 | Source: Ambulatory Visit | Attending: Cardiology | Admitting: Cardiology

## 2019-09-05 ENCOUNTER — Encounter (HOSPITAL_COMMUNITY): Payer: Self-pay | Admitting: Cardiology

## 2019-09-05 ENCOUNTER — Other Ambulatory Visit: Payer: Self-pay

## 2019-09-05 VITALS — BP 121/63 | HR 73 | Wt 160.4 lb

## 2019-09-05 DIAGNOSIS — Z87891 Personal history of nicotine dependence: Secondary | ICD-10-CM | POA: Diagnosis not present

## 2019-09-05 DIAGNOSIS — I447 Left bundle-branch block, unspecified: Secondary | ICD-10-CM | POA: Insufficient documentation

## 2019-09-05 DIAGNOSIS — R9431 Abnormal electrocardiogram [ECG] [EKG]: Secondary | ICD-10-CM | POA: Insufficient documentation

## 2019-09-05 DIAGNOSIS — Z7982 Long term (current) use of aspirin: Secondary | ICD-10-CM | POA: Diagnosis not present

## 2019-09-05 DIAGNOSIS — Z79899 Other long term (current) drug therapy: Secondary | ICD-10-CM | POA: Insufficient documentation

## 2019-09-05 DIAGNOSIS — I2510 Atherosclerotic heart disease of native coronary artery without angina pectoris: Secondary | ICD-10-CM | POA: Diagnosis not present

## 2019-09-05 DIAGNOSIS — E785 Hyperlipidemia, unspecified: Secondary | ICD-10-CM | POA: Diagnosis not present

## 2019-09-05 DIAGNOSIS — I5022 Chronic systolic (congestive) heart failure: Secondary | ICD-10-CM

## 2019-09-05 DIAGNOSIS — I110 Hypertensive heart disease with heart failure: Secondary | ICD-10-CM | POA: Insufficient documentation

## 2019-09-05 DIAGNOSIS — I428 Other cardiomyopathies: Secondary | ICD-10-CM | POA: Insufficient documentation

## 2019-09-05 LAB — LIPID PANEL
Cholesterol: 184 mg/dL (ref 0–200)
HDL: 76 mg/dL (ref >40)
LDL Cholesterol: 75 mg/dL (ref 0–99)
Total CHOL/HDL Ratio: 2.4 RATIO
Triglycerides: 167 mg/dL — ABNORMAL HIGH (ref <150)
VLDL: 33 mg/dL (ref 0–40)

## 2019-09-05 NOTE — Progress Notes (Signed)
Patient ID: [name], male   DOB: 1961-03-19, 58 y.o.   MRN: [id number] PCP: Dr. Nancy Fetter Cardiology: Dr. Aundra Dubin  58 y.o. returns for followup of nonischemic cardiomyopathy.  Patient presented to [facility] in 3/11 after 2-3 weeks of shortness of breath.  He had a flu-like illness with congestion and myalgias and had been treated with clarithromycin as an outpatient.  However, he became progressively short of breath so went into the hospital.  He was noted to have BNP 976 and pulmonary edema on CXR.  Echo showed EF 20-25%.  Left heart cath was done showing mild coronary disease, suggesting that this was likely a nonischemic cardiomyopathy.  Patient was diuresed and begun on cardiac meds.  Cardiac MRI showed EF 20% without significant delayed enhancement, so no evidence for infiltrative disease.  Echo was repeated 9/11, showing some improvement.  Would estimate EF at 40% with diffuse hypokinesis.  Echo in 4/14 showed EF 55% with septal hypokinesis.  5/16 echo also showed EF 50-55% range.   Echo in 7/17 showed EF back down to 30-35%.  He had not been taking his medications regularly (thinks he was missing about 50% of doses).  Cardiolite done in 8/17, showing EF 36% and possible inferior MI.  Repeat cardiac cath in 9/17 after abnormal Cardiolite showed diffuse but nonobstructive CAD.  Repeat echo in 2/18 showed EF back up to 50%.    Echo in 1/20 showed EF 40-45% with normal RV size and systolic function. He was started back on Bidil.   He is doing well symptomatically.  Works as a Presenter, broadcasting at [employer].  His job is not very active.  He tries to walk his dog some.  No significant exertional dyspnea.  No chest pain.  No orthopnea/PND.  He is not lightheaded as long as he spaces his Bidil out from his other meds. He is still smoking but has set a quit date for 09/06/19 and will use nicotine patches. He was seen in the ER on 12/26 for abdominal/lower chest discomfort resolving with flatulence.   Hs-TnI was negative.   ECG (personally reviewed): NSR, LBBB 138 msec    Labs (3/11): UPEP/SPEP negative, ANA negative, urine drug screen negative, HIV negative, TSH normal, LDL 158, HDL 106, TGs 200, BNP 976, ESR 3, K 4, creatinine 1.01 Labs (4/11): BNP 212, K 4.2, creatinine 0.97 Labs (7/11): BNP 81, K 4.1, creatinine 0.9 Labs (10/11): K 4.2, creatinine 0.8, LDL 98, HDL 68 Labs (1/12): K 3.8, creatinine 0.9, BNP 70 Labs (8/12): K 3.8, creatinine 0.9, HDL 90, LDL 48 Labs (3/14): K 3.5, creatinine 0.9 Labs (5/16): LDL 85 Labs (12/16): K 3.9, creatinine 0.86 Labs (7/17): LDL 55, HDL 146, BNP 24, K 3.7, creatinine 0.7 Labs (8/17): K 3.3, creatinine 0.83 Labs (9/17): K 3.9, creatinine 0.85 Labs (10/17): K 3.8, creatinine 1.05 Labs (5/18): K 3.6, creatinine 0.91 Labs (8/18): creatinine 1.12 Labs (12/18): LDL 68 Labs (12/20): BNP 42, K 4, creatinine 1.04  Allergies:  1)  ! Pcn  Past Medical History: 1. Nonischemic Cardiomyopathy: LHC (3/11) showed EF 15%, 40% ostial CFX, 40% prox RCA, 40-50% mid RCA.  Echo (3/11): Moderately dilated LV, EF 20-25%, global HK, severe diastolic dysfunction, mild RV dilation and dysfunction, moderate MR. SPEP/UPEP negative, ANA negative, HIV negative, TSH normal, no drug history.  Moderate ETOH use.  Highest suspicion probably viral myocarditis.   Cardiac MRI (4/11) showed a moderately dilated LV with global systolic dysfunction, EF 63%, normal RV size with moderate systolic  dysfunction, small area of nonspecific subepicardial basal inferoseptal (RV insertion site) delayed enhancement.  Repeat echo (9/11): EF 40% with mild to moderate diffuse hypokinesis.  Echo (4/14) with EF 55%, septal hypokinesis, mild MR.  - Echo (5/16): EF 50-55%, moderate MR. - Echo (7/17): EF 30-35%, mild MR.   - LHC (9/17) with nonobstructive CAD.  - Echo (2/18): EF 50%, diffuse hypokinesis, normal RV size and systolic function.  - Echo (1/20): EF 40-45%, normal RV size and systolic  function.  2. Nonobstructive CAD:  - LHC (3/11) showed EF 15%, 40% ostial CFX, 40% prox RCA, 40-50% mid RCA.  - Cardiolite (8/17) with EF 36%, possible inferior MI.  - LHC (9/17) with EF 45%, 50% ostial stenosis small AV LCx, diffuse RCA disease, 40% proximal/50% mid.  3. Smoking 4. Hyperlipidemia  Family History: The patient is adopted so does not know his family history.   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  smoked half pack per day for over 30 years, now using e-cigarette and has mostly quit smoking.  Alcohol Use - yes, 3-4 drinks about 4 times a week in the past, has now cut back. Married, has two children.    ROS: All systems reviewed and negative except as per HPI.   Current Outpatient Medications  Medication Sig Dispense Refill  . aspirin (ASPIR-81) 81 MG EC tablet Take 1 tablet (81 mg total) by mouth daily. 90 tablet 3  . atorvastatin (LIPITOR) 20 MG tablet Take 1 tablet (20 mg total) by mouth daily. 30 tablet 0  . carvedilol (COREG) 25 MG tablet Take 1 tablet (25 mg total) by mouth 2 (two) times daily with a meal. 180 tablet 3  . enalapril (VASOTEC) 20 MG tablet TAKE 1 TABLET TWICE A DAY ( NEEDS OFFICE VISIT [phone] ) 180 tablet 3  . Icosapent Ethyl 1 g CAPS Take 2 capsules (2 g total) by mouth 2 (two) times daily. 180 capsule 6  . isosorbide-hydrALAZINE (BIDIL) 20-37.5 MG tablet Take 1 tablet by mouth 3 (three) times daily. 135 tablet 11  . Omega-3 Fatty Acids (FISH OIL BURP-LESS) 1000 MG CAPS Take 2,000 mg 2 (two) times daily by mouth. 120 capsule 6  . spironolactone (ALDACTONE) 25 MG tablet Take 1 tablet (25 mg total) by mouth daily. 7 tablet 0   No current facility-administered medications for this encounter.    BP 121/63   Pulse 73   Wt 72.8 kg (160 lb 6.4 oz)   SpO2 97%   BMI 24.39 kg/m  General: NAD Neck: No JVD, no thyromegaly or thyroid nodule.  Lungs: Clear to auscultation bilaterally with normal  respiratory effort. CV: Nondisplaced PMI.  Heart regular S1/S2, no S3/S4, no murmur.  No peripheral edema.  No carotid bruit.  Normal pedal pulses.  Abdomen: Soft, nontender, no hepatosplenomegaly, no distention.  Skin: Intact without lesions or rashes.  Neurologic: Alert and oriented x 3.  Psych: Normal affect. Extremities: No clubbing or cyanosis.  HEENT: Normal.   Assessment/Plan: 1. Chronic systolic CHF: History of nonischemic cardiomyopathy, had recovered to near normal on 2016 echo but EF down to 30-35% on 7/17 echo.  Repeat LHC in 9/17 showed diffuse but nonobstructive CAD.  EF was up to 50% on 2/18 echo, but is down some on 1/20 echo to 40-45%. He is not volume overloaded on exam, NYHA class I.  He now has a LBBB but not wide enough to be a CRT candidate.  - Continue current  Coreg, enalapril, and spironolactone.  BMET today and needs every 3 months.  We discussed need for compliance with labwork. - Continue Bidil 1 tab tid. I do not think he will be able to increase this due to orthostatic symptoms.  - I will arrange for repeat echo.   2. HTN: BP controlled.    3. Hyperlipidemia: Check lipids.    4. Smoking: He has a quit date and will use nicotine patches.   5. CAD: Diffuse nonobstructive disease on 9/17 cath. Continue ASA 81 and atorvastatin with goal LDL < 70.   Followup in 3 months.   Loralie Champagne 09/05/2019

## 2019-09-05 NOTE — Patient Instructions (Signed)
Lab work done today. We will notify you of any abnormal lab work. No news is good news!  EKG done today.  Your physician has requested that you have an echocardiogram. Echocardiography is a painless test that uses sound waves to create images of your heart. It provides your doctor with information about the size and shape of your heart and how well your heart's chambers and valves are working. This procedure takes approximately one hour. There are no restrictions for this procedure. This will be done at your next appointment.  Please follow up with the [facility] in 3 months with an echocardiogram.  At the [facility], you and your health needs are our priority. As part of our continuing mission to provide you with exceptional heart care, we have created designated Provider Care Teams. These Care Teams include your primary Cardiologist (physician) and Advanced Practice Providers (APPs- Physician Assistants and Nurse Practitioners) who all work together to provide you with the care you need, when you need it.   You may see any of the following providers on your designated Care Team at your next follow up: Marland Kitchen Dr Glori Bickers . Dr Loralie Champagne . Darrick Grinder, NP . Lyda Jester, PA . Audry Riles, PharmD   Please be sure to bring in all your medications bottles to every appointment.

## 2019-09-06 ENCOUNTER — Encounter (HOSPITAL_COMMUNITY): Payer: Self-pay | Admitting: Cardiology

## 2019-12-05 ENCOUNTER — Other Ambulatory Visit (HOSPITAL_COMMUNITY): Payer: Self-pay | Admitting: *Deleted

## 2019-12-05 DIAGNOSIS — I5022 Chronic systolic (congestive) heart failure: Secondary | ICD-10-CM

## 2019-12-05 MED ORDER — ATORVASTATIN CALCIUM 20 MG PO TABS: 20.0000 mg | ORAL_TABLET | Freq: Every day | ORAL | 3 refills | Status: DC

## 2019-12-05 MED ORDER — SPIRONOLACTONE 25 MG PO TABS: 25.0000 mg | ORAL_TABLET | Freq: Every day | ORAL | 3 refills | Status: DC

## 2019-12-05 MED ORDER — SPIRONOLACTONE 25 MG PO TABS: 25.0000 mg | ORAL_TABLET | Freq: Every day | ORAL | 0 refills | Status: DC

## 2019-12-10 ENCOUNTER — Ambulatory Visit (HOSPITAL_COMMUNITY)
Admission: RE | Admit: 2019-12-10 | Discharge: 2019-12-10 | Disposition: A | Discharge status: Home / Self Care | Payer: BC Managed Care – PPO | Source: Ambulatory Visit | Attending: Family Medicine | Admitting: Family Medicine

## 2019-12-10 ENCOUNTER — Other Ambulatory Visit: Payer: Self-pay

## 2019-12-10 ENCOUNTER — Encounter (HOSPITAL_COMMUNITY): Payer: Self-pay | Admitting: Cardiology

## 2019-12-10 ENCOUNTER — Ambulatory Visit (HOSPITAL_BASED_OUTPATIENT_CLINIC_OR_DEPARTMENT_OTHER)
Admission: RE | Admit: 2019-12-10 | Discharge: 2019-12-10 | Disposition: A | Discharge status: Home / Self Care | Payer: BC Managed Care – PPO | Source: Ambulatory Visit | Attending: Cardiology | Admitting: Cardiology

## 2019-12-10 VITALS — BP 118/72 | HR 71 | Wt 159.8 lb

## 2019-12-10 DIAGNOSIS — Z87891 Personal history of nicotine dependence: Secondary | ICD-10-CM | POA: Insufficient documentation

## 2019-12-10 DIAGNOSIS — I447 Left bundle-branch block, unspecified: Secondary | ICD-10-CM | POA: Diagnosis not present

## 2019-12-10 DIAGNOSIS — Z79899 Other long term (current) drug therapy: Secondary | ICD-10-CM | POA: Insufficient documentation

## 2019-12-10 DIAGNOSIS — I5022 Chronic systolic (congestive) heart failure: Secondary | ICD-10-CM | POA: Diagnosis not present

## 2019-12-10 DIAGNOSIS — E785 Hyperlipidemia, unspecified: Secondary | ICD-10-CM | POA: Insufficient documentation

## 2019-12-10 DIAGNOSIS — Z7982 Long term (current) use of aspirin: Secondary | ICD-10-CM | POA: Insufficient documentation

## 2019-12-10 DIAGNOSIS — I428 Other cardiomyopathies: Secondary | ICD-10-CM | POA: Diagnosis not present

## 2019-12-10 DIAGNOSIS — I110 Hypertensive heart disease with heart failure: Secondary | ICD-10-CM | POA: Diagnosis not present

## 2019-12-10 DIAGNOSIS — I2510 Atherosclerotic heart disease of native coronary artery without angina pectoris: Secondary | ICD-10-CM | POA: Insufficient documentation

## 2019-12-10 LAB — BASIC METABOLIC PANEL
Anion gap: 7 (ref 5–15)
BUN: 11 mg/dL (ref 6–20)
CO2: 24 mmol/L (ref 22–32)
Calcium: 8.6 mg/dL — ABNORMAL LOW (ref 8.9–10.3)
Chloride: 107 mmol/L (ref 98–111)
Creatinine, Ser: 0.92 mg/dL (ref 0.61–1.24)
GFR calc Af Amer: >60 mL/min (ref >60)
GFR calc non Af Amer: >60 mL/min (ref >60)
Glucose, Bld: 99 mg/dL (ref 70–99)
Potassium: 3.8 mmol/L (ref 3.5–5.1)
Sodium: 138 mmol/L (ref 135–145)

## 2019-12-10 MED ORDER — SACUBITRIL-VALSARTAN 49-51 MG PO TABS: 1.0000 | ORAL_TABLET | Freq: Two times a day (BID) | ORAL | 0 refills | Status: DC

## 2019-12-10 MED ORDER — SACUBITRIL-VALSARTAN 49-51 MG PO TABS: 1.0000 | ORAL_TABLET | Freq: Two times a day (BID) | ORAL | 2 refills | Status: DC

## 2019-12-10 MED ORDER — SACUBITRIL-VALSARTAN 49-51 MG PO TABS: 1.0000 | ORAL_TABLET | Freq: Two times a day (BID) | ORAL | 5 refills | Status: DC

## 2019-12-10 NOTE — Patient Instructions (Addendum)
STOP Enalapril  START Entresto 49/51mg  (1 tab) twice a day 36hrs after last dose of Enalapril (start on Wednesday 12/12/19)  Labs today and repeat in 10 days We will only contact you if something comes back abnormal or we need to make some changes. Otherwise no news is good news!  Keep Alcohol down to no more than 1 beer a day   Your physician recommends that you schedule a follow-up appointment in: 3 months with Dr Shirlee Latch.   Please call office at [phone] option 2 if you have any questions or concerns.    At the [facility], you and your health needs are our priority. As part of our continuing mission to provide you with exceptional heart care, we have created designated Provider Care Teams. These Care Teams include your primary Cardiologist (physician) and Advanced Practice Providers (APPs- Physician Assistants and Nurse Practitioners) who all work together to provide you with the care you need, when you need it.    You may see any of the following providers on your designated Care Team at your next follow up: Marland Kitchen Dr Arvilla Meres . Dr Marca Ancona . Tonye Becket, NP . Robbie Lis, PA . Karle Plumber, PharmD   Please be sure to bring in all your medications bottles to every appointment.

## 2019-12-10 NOTE — Progress Notes (Signed)
  Echocardiogram 2D Echocardiogram has been performed.  Leta Jungling M 12/10/2019, 1:37 PM

## 2019-12-11 NOTE — Progress Notes (Signed)
Patient ID: [name], male   DOB: 1961-05-12, 59 y.o.   MRN: [id number] PCP: Dr. Nancy Fetter Cardiology: Dr. Aundra Dubin  59 y.o. returns for followup of nonischemic cardiomyopathy.  Patient presented to [facility] in 3/11 after 2-3 weeks of shortness of breath.  He had a flu-like illness with congestion and myalgias and had been treated with clarithromycin as an outpatient.  However, he became progressively short of breath so went into the hospital.  He was noted to have BNP 976 and pulmonary edema on CXR.  Echo showed EF 20-25%.  Left heart cath was done showing mild coronary disease, suggesting that this was likely a nonischemic cardiomyopathy.  Patient was diuresed and begun on cardiac meds.  Cardiac MRI showed EF 20% without significant delayed enhancement, so no evidence for infiltrative disease.  Echo was repeated 9/11, showing some improvement.  Would estimate EF at 40% with diffuse hypokinesis.  Echo in 4/14 showed EF 55% with septal hypokinesis.  5/16 echo also showed EF 50-55% range.   Echo in 7/17 showed EF back down to 30-35%.  He had not been taking his medications regularly (thinks he was missing about 50% of doses).  Cardiolite done in 8/17, showing EF 36% and possible inferior MI.  Repeat cardiac cath in 9/17 after abnormal Cardiolite showed diffuse but nonobstructive CAD.  Repeat echo in 2/18 showed EF back up to 50%.    Echo in 1/20 showed EF 40-45% with normal RV size and systolic function. He was started back on Bidil.  Echo was done today and reviewed, EF 40-45%, mild diffuse hypokinesis, normal RV.   He continues to work as a Presenter, broadcasting.  He quit smoking several weeks ago but still drinks 2-3 beers 3-4 days/week.  He has been a "little more short of breath" with walking recently => notes dyspnea with stairs and inclines.  No dyspnea on flat ground.  No orthopnea/PND.  No chest pain.  No lightheadedness.    ECG (personally reviewed): NSR, LBBB 154 msec    Labs (3/11): UPEP/SPEP  negative, ANA negative, urine drug screen negative, HIV negative, TSH normal, LDL 158, HDL 106, TGs 200, BNP 976, ESR 3, K 4, creatinine 1.01 Labs (4/11): BNP 212, K 4.2, creatinine 0.97 Labs (7/11): BNP 81, K 4.1, creatinine 0.9 Labs (10/11): K 4.2, creatinine 0.8, LDL 98, HDL 68 Labs (1/12): K 3.8, creatinine 0.9, BNP 70 Labs (8/12): K 3.8, creatinine 0.9, HDL 90, LDL 48 Labs (3/14): K 3.5, creatinine 0.9 Labs (5/16): LDL 85 Labs (12/16): K 3.9, creatinine 0.86 Labs (7/17): LDL 55, HDL 146, BNP 24, K 3.7, creatinine 0.7 Labs (8/17): K 3.3, creatinine 0.83 Labs (9/17): K 3.9, creatinine 0.85 Labs (10/17): K 3.8, creatinine 1.05 Labs (5/18): K 3.6, creatinine 0.91 Labs (8/18): creatinine 1.12 Labs (12/18): LDL 68 Labs (12/20): BNP 42, K 4, creatinine 1.04, LDL 75, HDL 76  Allergies:  1)  ! Pcn  Past Medical History: 1. Nonischemic Cardiomyopathy: LHC (3/11) showed EF 15%, 40% ostial CFX, 40% prox RCA, 40-50% mid RCA.  Echo (3/11): Moderately dilated LV, EF 20-25%, global HK, severe diastolic dysfunction, mild RV dilation and dysfunction, moderate MR. SPEP/UPEP negative, ANA negative, HIV negative, TSH normal, no drug history.  Moderate ETOH use.  Highest suspicion probably viral myocarditis.   Cardiac MRI (4/11) showed a moderately dilated LV with global systolic dysfunction, EF 34%, normal RV size with moderate systolic dysfunction, small area of nonspecific subepicardial basal inferoseptal (RV insertion site) delayed enhancement.  Repeat echo (  9/11): EF 40% with mild to moderate diffuse hypokinesis.  Echo (4/14) with EF 55%, septal hypokinesis, mild MR.  - Echo (5/16): EF 50-55%, moderate MR. - Echo (7/17): EF 30-35%, mild MR.   - LHC (9/17) with nonobstructive CAD.  - Echo (2/18): EF 50%, diffuse hypokinesis, normal RV size and systolic function.  - Echo (1/20): EF 40-45%, normal RV size and systolic function.  - Echo (4/21): EF 40-45%, normal RV  2. Nonobstructive CAD:  - LHC (3/11)  showed EF 15%, 40% ostial CFX, 40% prox RCA, 40-50% mid RCA.  - Cardiolite (8/17) with EF 36%, possible inferior MI.  - LHC (9/17) with EF 45%, 50% ostial stenosis small AV LCx, diffuse RCA disease, 40% proximal/50% mid.  3. Smoking 4. Hyperlipidemia  Family History: The patient is adopted so does not know his family history.   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  smoked half pack per day for over 30 years, quit in 3/21.  Alcohol Use - yes, 2-3 drinks about 3-4 times a week in the past, has now cut back. Married, has two children.    ROS: All systems reviewed and negative except as per HPI.   Current Outpatient Medications  Medication Sig Dispense Refill  . aspirin (ASPIR-81) 81 MG EC tablet Take 1 tablet (81 mg total) by mouth daily. 90 tablet 3  . atorvastatin (LIPITOR) 20 MG tablet Take 1 tablet (20 mg total) by mouth daily. 90 tablet 3  . carvedilol (COREG) 25 MG tablet Take 1 tablet (25 mg total) by mouth 2 (two) times daily with a meal. 180 tablet 3  . isosorbide-hydrALAZINE (BIDIL) 20-37.5 MG tablet Take 1 tablet by mouth 3 (three) times daily. 135 tablet 11  . Omega-3 Fatty Acids (FISH OIL BURP-LESS) 1000 MG CAPS Take 2,000 mg 2 (two) times daily by mouth. 120 capsule 6  . spironolactone (ALDACTONE) 25 MG tablet Take 1 tablet (25 mg total) by mouth daily. 7 tablet 0  . sacubitril-valsartan (ENTRESTO) 49-51 MG Take 1 tablet by mouth 2 (two) times daily. 60 tablet 0  . sacubitril-valsartan (ENTRESTO) 49-51 MG Take 1 tablet by mouth 2 (two) times daily. 180 tablet 2   No current facility-administered medications for this encounter.    BP 118/72   Pulse 71   Wt 72.5 kg (159 lb 12.8 oz)   SpO2 100%   BMI 24.30 kg/m  General: NAD Neck: No JVD, no thyromegaly or thyroid nodule.  Lungs: Clear to auscultation bilaterally with normal respiratory effort. CV: Nondisplaced PMI.  Heart regular S1/S2, no S3/S4, no murmur.  No peripheral  edema.  No carotid bruit.  Normal pedal pulses.  Abdomen: Soft, nontender, no hepatosplenomegaly, no distention.  Skin: Intact without lesions or rashes.  Neurologic: Alert and oriented x 3.  Psych: Normal affect. Extremities: No clubbing or cyanosis.  HEENT: Normal.   Assessment/Plan: 1. Chronic systolic CHF: History of nonischemic cardiomyopathy, had recovered to near normal on 2016 echo but EF down to 30-35% on 7/17 echo.  Repeat LHC in 9/17 showed diffuse but nonobstructive CAD.  EF was up to 50% on 2/18 echo, but was down some on 1/20 echo to 40-45% and remains 40-45% on echo done today. He is not volume overloaded on exam, NYHA class II symptoms, mildly worse recently.  He now has a LBBB but EF is not low enough for CRT.  - Continue Coreg and spironolactone, on goal doses.  - Continue Bidil 1 tab  tid. He has not tolerated increase in the past.  - Stop enalapril, after 36 hrs will start Entresto 49/51 bid.  BMET today and in 10 days.   - He is not drinking particularly heavily but asked him to cut back to no more than 1 beer/day.  2. HTN: BP controlled.    3. Hyperlipidemia: Good lipids in 12/20.     4. Smoking: He has now quit.  5. CAD: Diffuse nonobstructive disease on 9/17 cath. Continue ASA 81 and atorvastatin with goal LDL < 70.   Followup in 3 months.   Loralie Champagne 12/11/2019

## 2019-12-20 ENCOUNTER — Other Ambulatory Visit: Payer: Self-pay

## 2019-12-20 ENCOUNTER — Ambulatory Visit (HOSPITAL_COMMUNITY)
Admission: RE | Admit: 2019-12-20 | Discharge: 2019-12-20 | Disposition: A | Discharge status: Home / Self Care | Payer: BC Managed Care – PPO | Source: Ambulatory Visit | Attending: Cardiology | Admitting: Cardiology

## 2019-12-20 ENCOUNTER — Other Ambulatory Visit (HOSPITAL_COMMUNITY): Payer: BC Managed Care – PPO

## 2019-12-20 DIAGNOSIS — I5022 Chronic systolic (congestive) heart failure: Secondary | ICD-10-CM | POA: Insufficient documentation

## 2019-12-20 LAB — BASIC METABOLIC PANEL
Anion gap: 10 (ref 5–15)
BUN: 11 mg/dL (ref 6–20)
CO2: 27 mmol/L (ref 22–32)
Calcium: 9 mg/dL (ref 8.9–10.3)
Chloride: 105 mmol/L (ref 98–111)
Creatinine, Ser: 1.02 mg/dL (ref 0.61–1.24)
GFR calc Af Amer: >60 mL/min (ref >60)
GFR calc non Af Amer: >60 mL/min (ref >60)
Glucose, Bld: 124 mg/dL — ABNORMAL HIGH (ref 70–99)
Potassium: 3.8 mmol/L (ref 3.5–5.1)
Sodium: 142 mmol/L (ref 135–145)

## 2019-12-29 ENCOUNTER — Ambulatory Visit: Payer: BC Managed Care – PPO | Attending: Internal Medicine

## 2019-12-29 DIAGNOSIS — Z23 Encounter for immunization: Secondary | ICD-10-CM

## 2019-12-29 NOTE — Progress Notes (Signed)
   Covid-19 Vaccination Clinic  Name:  [name]    MRN: [id number] DOB: 27-Nov-1960  12/29/2019  Mr. [name] was observed post Covid-19 immunization for 30 minutes based on pre-vaccination screening without incident. He was provided with Vaccine Information Sheet and instruction to access the V-Safe system.   Mr. [name] was instructed to call 911 with any severe reactions post vaccine: Marland Kitchen Difficulty breathing  . Swelling of face and throat  . A fast heartbeat  . A bad rash all over body  . Dizziness and weakness   Immunizations Administered    Name Date Dose VIS Date Route   Pfizer COVID-19 Vaccine 12/29/2019  8:43 AM 0.3 mL 10/31/2018 Intramuscular   Manufacturer: [employer], [employer]   Lot: [id number]   NDC: [address]-4

## 2020-01-21 ENCOUNTER — Ambulatory Visit: Payer: BC Managed Care – PPO | Attending: Internal Medicine

## 2020-01-21 DIAGNOSIS — Z23 Encounter for immunization: Secondary | ICD-10-CM

## 2020-01-21 NOTE — Progress Notes (Addendum)
2  Covid-19 Vaccination Clinic  Name:  [name]    MRN: [id number] DOB: [birth year]  01/21/2020  [name] was observed post Covid-19 immunization for 30 without incident. He was provided with Vaccine Information Sheet and instruction to access the V-Safe system.   [name] was instructed to call 911 with any severe reactions post vaccine: Marland Kitchen Difficulty breathing  . Swelling of face and throat  . A fast heartbeat  . A bad rash all over body  . Dizziness and weakness   Immunizations Administered    Name Date Dose VIS Date Route   Pfizer COVID-19 Vaccine 01/21/2020  9:59 AM 0.3 mL 10/31/2018 Intramuscular   Manufacturer: [employer], [employer]   Lot: [id number]   NDC: [address]-1

## 2020-02-01 ENCOUNTER — Other Ambulatory Visit (HOSPITAL_COMMUNITY): Payer: Self-pay | Admitting: *Deleted

## 2020-02-01 MED ORDER — ICOSAPENT ETHYL 1 G PO CAPS: 2.0000 g | ORAL_CAPSULE | Freq: Two times a day (BID) | ORAL | 3 refills | Status: DC

## 2020-03-11 ENCOUNTER — Other Ambulatory Visit: Payer: Self-pay

## 2020-03-11 ENCOUNTER — Ambulatory Visit (HOSPITAL_COMMUNITY)
Admission: RE | Admit: 2020-03-11 | Discharge: 2020-03-11 | Disposition: A | Discharge status: Home / Self Care | Payer: BC Managed Care – PPO | Source: Ambulatory Visit | Attending: Cardiology | Admitting: Cardiology

## 2020-03-11 ENCOUNTER — Encounter (HOSPITAL_COMMUNITY): Payer: Self-pay | Admitting: Cardiology

## 2020-03-11 VITALS — BP 118/62 | HR 70 | Wt 160.0 lb

## 2020-03-11 DIAGNOSIS — I2510 Atherosclerotic heart disease of native coronary artery without angina pectoris: Secondary | ICD-10-CM | POA: Insufficient documentation

## 2020-03-11 DIAGNOSIS — Z7982 Long term (current) use of aspirin: Secondary | ICD-10-CM | POA: Insufficient documentation

## 2020-03-11 DIAGNOSIS — I428 Other cardiomyopathies: Secondary | ICD-10-CM | POA: Insufficient documentation

## 2020-03-11 DIAGNOSIS — I110 Hypertensive heart disease with heart failure: Secondary | ICD-10-CM | POA: Insufficient documentation

## 2020-03-11 DIAGNOSIS — I5022 Chronic systolic (congestive) heart failure: Secondary | ICD-10-CM | POA: Diagnosis not present

## 2020-03-11 DIAGNOSIS — I447 Left bundle-branch block, unspecified: Secondary | ICD-10-CM | POA: Insufficient documentation

## 2020-03-11 DIAGNOSIS — Z79899 Other long term (current) drug therapy: Secondary | ICD-10-CM | POA: Diagnosis not present

## 2020-03-11 DIAGNOSIS — Z87891 Personal history of nicotine dependence: Secondary | ICD-10-CM | POA: Diagnosis not present

## 2020-03-11 DIAGNOSIS — E785 Hyperlipidemia, unspecified: Secondary | ICD-10-CM | POA: Insufficient documentation

## 2020-03-11 LAB — BASIC METABOLIC PANEL
Anion gap: 10 (ref 5–15)
BUN: 13 mg/dL (ref 6–20)
CO2: 25 mmol/L (ref 22–32)
Calcium: 8.8 mg/dL — ABNORMAL LOW (ref 8.9–10.3)
Chloride: 103 mmol/L (ref 98–111)
Creatinine, Ser: 0.86 mg/dL (ref 0.61–1.24)
GFR calc Af Amer: >60 mL/min (ref >60)
GFR calc non Af Amer: >60 mL/min (ref >60)
Glucose, Bld: 108 mg/dL — ABNORMAL HIGH (ref 70–99)
Potassium: 3.9 mmol/L (ref 3.5–5.1)
Sodium: 138 mmol/L (ref 135–145)

## 2020-03-11 MED ORDER — ENTRESTO 97-103 MG PO TABS: 1.0000 | ORAL_TABLET | Freq: Two times a day (BID) | ORAL | 3 refills | Status: DC

## 2020-03-11 NOTE — Progress Notes (Signed)
Patient ID: [name], male   DOB: [birth year], 59 y.o.   MRN: [id number] PCP: Dr. Nancy Fetter Cardiology: Dr. Aundra Dubin  59 y.o. returns for followup of nonischemic cardiomyopathy.  Patient presented to [facility] in 3/11 after 2-3 weeks of shortness of breath.  He had a flu-like illness with congestion and myalgias and had been treated with clarithromycin as an outpatient.  However, he became progressively short of breath so went into the hospital.  He was noted to have BNP 976 and pulmonary edema on CXR.  Echo showed EF 20-25%.  Left heart cath was done showing mild coronary disease, suggesting that this was likely a nonischemic cardiomyopathy.  Patient was diuresed and begun on cardiac meds.  Cardiac MRI showed EF 20% without significant delayed enhancement, so no evidence for infiltrative disease.  Echo was repeated 9/11, showing some improvement.  Would estimate EF at 40% with diffuse hypokinesis.  Echo in 4/14 showed EF 55% with septal hypokinesis.  5/16 echo also showed EF 50-55% range.   Echo in 7/17 showed EF back down to 30-35%.  He had not been taking his medications regularly (thinks he was missing about 50% of doses).  Cardiolite done in 8/17, showing EF 36% and possible inferior MI.  Repeat cardiac cath in 9/17 after abnormal Cardiolite showed diffuse but nonobstructive CAD.  Repeat echo in 2/18 showed EF back up to 50%.    Echo in 1/20 showed EF 40-45% with normal RV size and systolic function. He was started back on Bidil.  Echo in 4/21 showed EF 40-45%, mild diffuse hypokinesis, normal RV.   He continues to work as a Presenter, broadcasting.  He no longer smokes.  He has cut back to 1-2 beers 1-2 times/week. No significant exertional dyspnea, works in yard (cuts grass, etc) without dyspnea.  No problems working.  No orthopnea/PND.  No chest pain.  No lightheadedness.  No palptitations.  Labs (3/11): UPEP/SPEP negative, ANA negative, urine drug screen negative, HIV negative, TSH normal, LDL 158, HDL  106, TGs 200, BNP 976, ESR 3, K 4, creatinine 1.01 Labs (4/11): BNP 212, K 4.2, creatinine 0.97 Labs (7/11): BNP 81, K 4.1, creatinine 0.9 Labs (10/11): K 4.2, creatinine 0.8, LDL 98, HDL 68 Labs (1/12): K 3.8, creatinine 0.9, BNP 70 Labs (8/12): K 3.8, creatinine 0.9, HDL 90, LDL 48 Labs (3/14): K 3.5, creatinine 0.9 Labs (5/16): LDL 85 Labs (12/16): K 3.9, creatinine 0.86 Labs (7/17): LDL 55, HDL 146, BNP 24, K 3.7, creatinine 0.7 Labs (8/17): K 3.3, creatinine 0.83 Labs (9/17): K 3.9, creatinine 0.85 Labs (10/17): K 3.8, creatinine 1.05 Labs (5/18): K 3.6, creatinine 0.91 Labs (8/18): creatinine 1.12 Labs (12/18): LDL 68 Labs (12/20): BNP 42, K 4, creatinine 1.04, LDL 75, HDL 76 Labs (4/21): K 3.8,creatinine 1.02  Allergies:  1)  ! Pcn  Past Medical History: 1. Nonischemic Cardiomyopathy: LHC (3/11) showed EF 15%, 40% ostial CFX, 40% prox RCA, 40-50% mid RCA.  Echo (3/11): Moderately dilated LV, EF 20-25%, global HK, severe diastolic dysfunction, mild RV dilation and dysfunction, moderate MR. SPEP/UPEP negative, ANA negative, HIV negative, TSH normal, no drug history.  Moderate ETOH use.  Highest suspicion probably viral myocarditis.   Cardiac MRI (4/11) showed a moderately dilated LV with global systolic dysfunction, EF 16%, normal RV size with moderate systolic dysfunction, small area of nonspecific subepicardial basal inferoseptal (RV insertion site) delayed enhancement.  Repeat echo (9/11): EF 40% with mild to moderate diffuse hypokinesis.  Echo (4/14) with EF 55%,  septal hypokinesis, mild MR.  - Echo (5/16): EF 50-55%, moderate MR. - Echo (7/17): EF 30-35%, mild MR.   - LHC (9/17) with nonobstructive CAD.  - Echo (2/18): EF 50%, diffuse hypokinesis, normal RV size and systolic function.  - Echo (1/20): EF 40-45%, normal RV size and systolic function.  - Echo (4/21): EF 40-45%, normal RV  2. Nonobstructive CAD:  - LHC (3/11) showed EF 15%, 40% ostial CFX, 40% prox RCA, 40-50%  mid RCA.  - Cardiolite (8/17) with EF 36%, possible inferior MI.  - LHC (9/17) with EF 45%, 50% ostial stenosis small AV LCx, diffuse RCA disease, 40% proximal/50% mid.  3. Smoking 4. Hyperlipidemia  Family History: The patient is adopted so does not know his family history.   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  smoked half pack per day for over 30 years, quit in 3/21.  Alcohol Use - yes, 2-3 drinks about 3-4 times a week in the past, has now cut back. Married, has two children.    ROS: All systems reviewed and negative except as per HPI.   Current Outpatient Medications  Medication Sig Dispense Refill  . aspirin (ASPIR-81) 81 MG EC tablet Take 1 tablet (81 mg total) by mouth daily. 90 tablet 3  . atorvastatin (LIPITOR) 20 MG tablet Take 1 tablet (20 mg total) by mouth daily. 90 tablet 3  . carvedilol (COREG) 25 MG tablet Take 1 tablet (25 mg total) by mouth 2 (two) times daily with a meal. 180 tablet 3  . icosapent Ethyl (VASCEPA) 1 g capsule Take 2 capsules (2 g total) by mouth 2 (two) times daily. 360 capsule 3  . isosorbide-hydrALAZINE (BIDIL) 20-37.5 MG tablet Take 1 tablet by mouth 3 (three) times daily. 135 tablet 11  . sacubitril-valsartan (ENTRESTO) 97-103 MG Take 1 tablet by mouth 2 (two) times daily. 180 tablet 3  . spironolactone (ALDACTONE) 25 MG tablet Take 1 tablet (25 mg total) by mouth daily. 7 tablet 0   No current facility-administered medications for this encounter.    BP 118/62   Pulse 70   Wt 72.6 kg (160 lb)   SpO2 99%   BMI 24.33 kg/m  General: NAD Neck: No JVD, no thyromegaly or thyroid nodule.  Lungs: Clear to auscultation bilaterally with normal respiratory effort. CV: Nondisplaced PMI.  Heart regular S1/S2, no S3/S4, no murmur.  No peripheral edema.  No carotid bruit.  Normal pedal pulses.  Abdomen: Soft, nontender, no hepatosplenomegaly, no distention.  Skin: Intact without lesions or rashes.   Neurologic: Alert and oriented x 3.  Psych: Normal affect. Extremities: No clubbing or cyanosis.  HEENT: Normal.   Assessment/Plan: 1. Chronic systolic CHF: History of nonischemic cardiomyopathy, had recovered to near normal on 2016 echo but EF down to 30-35% on 7/17 echo.  Repeat LHC in 9/17 showed diffuse but nonobstructive CAD.  EF was up to 50% on 2/18 echo, but was down some on 1/20 echo to 40-45% and remained 40-45% on 4/21 echo. He is not volume overloaded on exam, NYHA class I-II symptoms.  He now has a LBBB but EF is not low enough for CRT.  - Continue Coreg and spironolactone, on goal doses.  - Continue Bidil 1 tab tid. He has not tolerated increase in the past.  - Increase Entresto to 97/103 bid with BMET today and in 10 days.  - He has cut back on ETOH, encouraged him to keep ETOH minimal.  2.  HTN: BP controlled.    3. Hyperlipidemia: Good lipids in 12/20.     4. Smoking: He has now quit.  5. CAD: Diffuse nonobstructive disease on 9/17 cath. Continue ASA 81 and atorvastatin with goal LDL < 70.   Followup in 4 months.   Loralie Champagne 03/11/2020

## 2020-03-11 NOTE — Patient Instructions (Signed)
INCREASE Entresto to 97/103 mg, one tab twice daily  Labs today We will only contact you if something comes back abnormal or we need to make some changes. Otherwise no news is good news!  Labs needed in 2 weeks  Your physician recommends that you schedule a follow-up appointment in: 4 months with Dr Shirlee Latch  Do the following things EVERYDAY: 1) Weigh yourself in the morning before breakfast. Write it down and keep it in a log. 2) Take your medicines as prescribed 3) Eat low salt foods--Limit salt (sodium) to 2000 mg per day.  4) Stay as active as you can everyday 5) Limit all fluids for the day to less than 2 liters  At the [facility], you and your health needs are our priority. As part of our continuing mission to provide you with exceptional heart care, we have created designated Provider Care Teams. These Care Teams include your primary Cardiologist (physician) and Advanced Practice Providers (APPs- Physician Assistants and Nurse Practitioners) who all work together to provide you with the care you need, when you need it.   You may see any of the following providers on your designated Care Team at your next follow up: Marland Kitchen Dr Arvilla Meres . Dr Marca Ancona . Tonye Becket, NP . Robbie Lis, PA . Karle Plumber, PharmD   Please be sure to bring in all your medications bottles to every appointment.

## 2020-03-25 ENCOUNTER — Other Ambulatory Visit (HOSPITAL_COMMUNITY): Payer: 59

## 2020-06-12 DIAGNOSIS — R0683 Snoring: Secondary | ICD-10-CM | POA: Diagnosis not present

## 2020-06-12 DIAGNOSIS — I429 Cardiomyopathy, unspecified: Secondary | ICD-10-CM | POA: Diagnosis not present

## 2020-06-12 DIAGNOSIS — E785 Hyperlipidemia, unspecified: Secondary | ICD-10-CM | POA: Diagnosis not present

## 2020-06-12 DIAGNOSIS — G479 Sleep disorder, unspecified: Secondary | ICD-10-CM | POA: Diagnosis not present

## 2020-06-12 DIAGNOSIS — Z23 Encounter for immunization: Secondary | ICD-10-CM | POA: Diagnosis not present

## 2020-07-21 ENCOUNTER — Other Ambulatory Visit (HOSPITAL_COMMUNITY): Payer: Self-pay | Admitting: Cardiology

## 2020-07-21 DIAGNOSIS — I5022 Chronic systolic (congestive) heart failure: Secondary | ICD-10-CM

## 2020-08-25 DIAGNOSIS — R0683 Snoring: Secondary | ICD-10-CM | POA: Diagnosis not present

## 2020-08-25 DIAGNOSIS — I429 Cardiomyopathy, unspecified: Secondary | ICD-10-CM | POA: Diagnosis not present

## 2020-09-08 DIAGNOSIS — Z23 Encounter for immunization: Secondary | ICD-10-CM | POA: Diagnosis not present

## 2020-09-08 DIAGNOSIS — I110 Hypertensive heart disease with heart failure: Secondary | ICD-10-CM | POA: Diagnosis not present

## 2020-09-08 DIAGNOSIS — Z125 Encounter for screening for malignant neoplasm of prostate: Secondary | ICD-10-CM | POA: Diagnosis not present

## 2020-09-08 DIAGNOSIS — E785 Hyperlipidemia, unspecified: Secondary | ICD-10-CM | POA: Diagnosis not present

## 2020-09-08 DIAGNOSIS — Z0000 Encounter for general adult medical examination without abnormal findings: Secondary | ICD-10-CM | POA: Diagnosis not present

## 2020-09-17 ENCOUNTER — Other Ambulatory Visit (HOSPITAL_BASED_OUTPATIENT_CLINIC_OR_DEPARTMENT_OTHER): Payer: Self-pay

## 2020-09-17 DIAGNOSIS — R0683 Snoring: Secondary | ICD-10-CM

## 2020-09-17 DIAGNOSIS — G4710 Hypersomnia, unspecified: Secondary | ICD-10-CM

## 2020-09-17 DIAGNOSIS — R0681 Apnea, not elsewhere classified: Secondary | ICD-10-CM

## 2020-10-21 ENCOUNTER — Encounter (HOSPITAL_BASED_OUTPATIENT_CLINIC_OR_DEPARTMENT_OTHER): Payer: BC Managed Care – PPO | Admitting: Internal Medicine

## 2020-11-18 ENCOUNTER — Ambulatory Visit (HOSPITAL_BASED_OUTPATIENT_CLINIC_OR_DEPARTMENT_OTHER): Payer: BC Managed Care – PPO | Attending: Internal Medicine | Admitting: Internal Medicine

## 2020-11-18 ENCOUNTER — Other Ambulatory Visit: Payer: Self-pay

## 2020-11-18 DIAGNOSIS — I428 Other cardiomyopathies: Secondary | ICD-10-CM | POA: Insufficient documentation

## 2020-11-18 DIAGNOSIS — R0681 Apnea, not elsewhere classified: Secondary | ICD-10-CM

## 2020-11-18 DIAGNOSIS — I509 Heart failure, unspecified: Secondary | ICD-10-CM | POA: Insufficient documentation

## 2020-11-18 DIAGNOSIS — R0683 Snoring: Secondary | ICD-10-CM | POA: Diagnosis not present

## 2020-11-18 DIAGNOSIS — G4710 Hypersomnia, unspecified: Secondary | ICD-10-CM | POA: Diagnosis not present

## 2020-11-23 DIAGNOSIS — G4710 Hypersomnia, unspecified: Secondary | ICD-10-CM | POA: Diagnosis not present

## 2020-11-23 NOTE — Procedures (Signed)
   NAME: [name] DATE OF BIRTH:  31-Dec-1960 MEDICAL RECORD NUMBER [id number]  LOCATION: [facility]  PHYSICIAN: Deretha Emory  DATE OF STUDY: 11/18/2020  SLEEP STUDY TYPE: Nocturnal Polysomnogram               REFERRING PHYSICIAN: Deretha Emory, MD/Jennifer Yehuda Mao, PA-C  EPWORTH SLEEPINESS SCORE:  NA HEIGHT: 5\' 8"  (172.7 cm)  WEIGHT: 168 lb (76.2 kg)    Body mass index is 25.54 kg/m.  NECK SIZE: 16 in.  CLINICAL INFORMATION The patient was referred to the sleep center for evaluation of snoring, mild excessive daytime sleepiness, witnessed apnea. He has non-ischemic cardiomyopathy with EF of 4045%  MEDICATIONS No sleep medicine administered. All meds reviewed  SLEEP STUDY TECHNIQUE A multi-channel overnight Polysomnography study was performed. The channels recorded and monitored were central and occipital EEG, electrooculogram (EOG), submentalis EMG (chin), nasal and oral airflow, thoracic and abdominal wall motion, anterior tibialis EMG, snore microphone, electrocardiogram, and a pulse oximetry.  TECHNICAL COMMENTS Comments added by Technician: None Comments added by Scorer: N/A  SLEEP ARCHITECTURE The study was initiated at 10:36:05 AM and terminated at 4:43:45 PM. The total recorded time was 367.7 minutes. EEG confirmed total sleep time was 301.5 minutes yielding a sleep efficiency of 82.0%%. Sleep onset after lights out was 3.5 minutes with a REM latency of 70.5 minutes. The patient spent 16.4%% of the night in stage N1 sleep, 63.8%% in stage N2 sleep, 0.0%% in stage N3 and 19.7% in REM. Wake after sleep onset (WASO) was 62.7 minutes. The Arousal Index was 11.3/hour.  RESPIRATORY PARAMETERS There were a total of 1 respiratory disturbances out of which 1 were apneas ( 1 obstructive, 0 mixed, 0 central) and 0 hypopneas. The apnea/hypopnea index (AHI) was 0.2 events/hour. The central sleep apnea index was 0 events/hour. The REM AHI was 1.0  events/hour and NREM AHI was 0.0 events/hour. The supine AHI was 0.0 events/hour and the non supine AHI was 0.2 events/hour. The respiratory Disturbance Index was 2 events/hour overall and 1 event/hour in REM sleep. Respiratory disturbances were associated with oxygen desaturation down to a nadir of 90.0% during sleep. The mean oxygen saturation during the study was 95.4%. The cumulative time under 88% oxygen saturation was 0 minutes.  LEG MOVEMENT DATA The total leg movements were 775 with a resulting leg movement index of 154.2/hr . Associated arousal with leg movement index was 3.6/hr.  CARDIAC DATA The underlying cardiac rhythm was most consistent with sinus rhythm. Mean heart rate during sleep was 89.3 bpm. Additional rhythm abnormalities include None.  IMPRESSIONS - No Significant Obstructive Sleep Apnea (OSA) - Significant leg movements during sleep. However, few associated arousals.  DIAGNOSIS - Excessive daytime sleepiness  RECOMMENDATIONS - Sleep hygiene should be reviewed to assess factors that may improve sleep quality.  Sleep specialist, American Board of Internal Medicine  ELECTRONICALLY SIGNED ON:  11/23/2020, 3:55 PM [facility] PH: [phone]   FX: [id number] ACCREDITED BY THE AMERICAN ACADEMY OF SLEEP MEDICINE

## 2020-12-23 ENCOUNTER — Ambulatory Visit (HOSPITAL_COMMUNITY)
Admission: RE | Admit: 2020-12-23 | Discharge: 2020-12-23 | Disposition: A | Discharge status: Home / Self Care | Payer: BC Managed Care – PPO | Source: Ambulatory Visit | Attending: Cardiology | Admitting: Cardiology

## 2020-12-23 ENCOUNTER — Encounter (HOSPITAL_COMMUNITY): Payer: Self-pay | Admitting: Cardiology

## 2020-12-23 ENCOUNTER — Other Ambulatory Visit: Payer: Self-pay

## 2020-12-23 VITALS — BP 120/78 | HR 77 | Wt 169.2 lb

## 2020-12-23 DIAGNOSIS — I428 Other cardiomyopathies: Secondary | ICD-10-CM | POA: Insufficient documentation

## 2020-12-23 DIAGNOSIS — I5022 Chronic systolic (congestive) heart failure: Secondary | ICD-10-CM

## 2020-12-23 DIAGNOSIS — Z79899 Other long term (current) drug therapy: Secondary | ICD-10-CM | POA: Insufficient documentation

## 2020-12-23 DIAGNOSIS — I2510 Atherosclerotic heart disease of native coronary artery without angina pectoris: Secondary | ICD-10-CM | POA: Insufficient documentation

## 2020-12-23 DIAGNOSIS — I447 Left bundle-branch block, unspecified: Secondary | ICD-10-CM | POA: Insufficient documentation

## 2020-12-23 DIAGNOSIS — I110 Hypertensive heart disease with heart failure: Secondary | ICD-10-CM | POA: Insufficient documentation

## 2020-12-23 DIAGNOSIS — Z87891 Personal history of nicotine dependence: Secondary | ICD-10-CM | POA: Insufficient documentation

## 2020-12-23 DIAGNOSIS — Z7982 Long term (current) use of aspirin: Secondary | ICD-10-CM | POA: Diagnosis not present

## 2020-12-23 DIAGNOSIS — E785 Hyperlipidemia, unspecified: Secondary | ICD-10-CM

## 2020-12-23 HISTORY — DX: Heart failure, unspecified: I50.9

## 2020-12-23 LAB — BASIC METABOLIC PANEL
Anion gap: 8 (ref 5–15)
BUN: 11 mg/dL (ref 6–20)
CO2: 27 mmol/L (ref 22–32)
Calcium: 9.2 mg/dL (ref 8.9–10.3)
Chloride: 105 mmol/L (ref 98–111)
Creatinine, Ser: 0.97 mg/dL (ref 0.61–1.24)
GFR, Estimated: >60 mL/min (ref >60)
Glucose, Bld: 106 mg/dL — ABNORMAL HIGH (ref 70–99)
Potassium: 4.5 mmol/L (ref 3.5–5.1)
Sodium: 140 mmol/L (ref 135–145)

## 2020-12-23 LAB — LIPID PANEL
Cholesterol: 175 mg/dL (ref 0–200)
HDL: 67 mg/dL (ref >40)
LDL Cholesterol: 65 mg/dL (ref 0–99)
Total CHOL/HDL Ratio: 2.6 RATIO
Triglycerides: 214 mg/dL — ABNORMAL HIGH (ref <150)
VLDL: 43 mg/dL — ABNORMAL HIGH (ref 0–40)

## 2020-12-23 NOTE — Patient Instructions (Signed)
Labs done today. We will contact you only if your labs are abnormal.  No medication changes were made. Please continue all current medications as prescribed.  Your physician recommends that you schedule a follow-up appointment in: soon for an echo,3 months for a lab only appointment and in 6 months with Dr. Shirlee Latch. Please contact our office in September for an October appointment.   Your physician has requested that you have an echocardiogram. Echocardiography is a painless test that uses sound waves to create images of your heart. It provides your doctor with information about the size and shape of your heart and how well your heart's chambers and valves are working. This procedure takes approximately one hour. There are no restrictions for this procedure.   If you have any questions or concerns before your next appointment please send [place] a message through [place] or call our office at [phone].    TO LEAVE A MESSAGE FOR THE NURSE SELECT OPTION 2, PLEASE LEAVE A MESSAGE INCLUDING: . YOUR NAME . DATE OF BIRTH . CALL BACK NUMBER . REASON FOR CALL**this is important as we prioritize the call backs  YOU WILL RECEIVE A CALL BACK THE SAME DAY AS LONG AS YOU CALL BEFORE 4:00 PM   Do the following things EVERYDAY: 1) Weigh yourself in the morning before breakfast. Write it down and keep it in a log. 2) Take your medicines as prescribed 3) Eat low salt foods--Limit salt (sodium) to 2000 mg per day.  4) Stay as active as you can everyday 5) Limit all fluids for the day to less than 2 liters   At the [facility], you and your health needs are our priority. As part of our continuing mission to provide you with exceptional heart care, we have created designated Provider Care Teams. These Care Teams include your primary Cardiologist (physician) and Advanced Practice Providers (APPs- Physician Assistants and Nurse Practitioners) who all work together to provide you with the care  you need, when you need it.   You may see any of the following providers on your designated Care Team at your next follow up: Marland Kitchen Dr Arvilla Meres . Dr Marca Ancona . Tonye Becket, NP . Robbie Lis, PA . Karle Plumber, PharmD   Please be sure to bring in all your medications bottles to every appointment.

## 2020-12-23 NOTE — Progress Notes (Signed)
Patient ID: [name], male   DOB: 1961/01/14, 60 y.o.   MRN: [id number] PCP: Dr. Nancy Fetter Cardiology: Dr. Aundra Dubin  60 y.o. returns for followup of nonischemic cardiomyopathy.  Patient presented to [facility] in 3/11 after 2-3 weeks of shortness of breath.  He had a flu-like illness with congestion and myalgias and had been treated with clarithromycin as an outpatient.  However, he became progressively short of breath so went into the hospital.  He was noted to have BNP 976 and pulmonary edema on CXR.  Echo showed EF 20-25%.  Left heart cath was done showing mild coronary disease, suggesting that this was likely a nonischemic cardiomyopathy.  Patient was diuresed and begun on cardiac meds.  Cardiac MRI showed EF 20% without significant delayed enhancement, so no evidence for infiltrative disease.  Echo was repeated 9/11, showing some improvement.  Would estimate EF at 40% with diffuse hypokinesis.  Echo in 4/14 showed EF 55% with septal hypokinesis.  5/16 echo also showed EF 50-55% range.   Echo in 7/17 showed EF back down to 30-35%.  He had not been taking his medications regularly (thinks he was missing about 50% of doses).  Cardiolite done in 8/17, showing EF 36% and possible inferior MI.  Repeat cardiac cath in 9/17 after abnormal Cardiolite showed diffuse but nonobstructive CAD.  Repeat echo in 2/18 showed EF back up to 50%.    Echo in 1/20 showed EF 40-45% with normal RV size and systolic function. He was started back on Bidil.  Echo in 4/21 showed EF 40-45%, mild diffuse hypokinesis, normal RV.   He continues to work as a Presenter, broadcasting, now at [employer].  He does not get as much walking in with this job as with his prior job, and weight is up 9 lbs.  No exertional dyspnea or chest pain.  He cuts his grass and does his yardwork.  No orthopnea/PND.  No palpitations.  No lightheadedness. Drinking about 3 beers 3-4 times/week.   ECG (personally reviewed): NSR, LBBB (144 msec)  Labs (3/11):  UPEP/SPEP negative, ANA negative, urine drug screen negative, HIV negative, TSH normal, LDL 158, HDL 106, TGs 200, BNP 976, ESR 3, K 4, creatinine 1.01 Labs (4/11): BNP 212, K 4.2, creatinine 0.97 Labs (7/11): BNP 81, K 4.1, creatinine 0.9 Labs (10/11): K 4.2, creatinine 0.8, LDL 98, HDL 68 Labs (1/12): K 3.8, creatinine 0.9, BNP 70 Labs (8/12): K 3.8, creatinine 0.9, HDL 90, LDL 48 Labs (3/14): K 3.5, creatinine 0.9 Labs (5/16): LDL 85 Labs (12/16): K 3.9, creatinine 0.86 Labs (7/17): LDL 55, HDL 146, BNP 24, K 3.7, creatinine 0.7 Labs (8/17): K 3.3, creatinine 0.83 Labs (9/17): K 3.9, creatinine 0.85 Labs (10/17): K 3.8, creatinine 1.05 Labs (5/18): K 3.6, creatinine 0.91 Labs (8/18): creatinine 1.12 Labs (12/18): LDL 68 Labs (12/20): BNP 42, K 4, creatinine 1.04, LDL 75, HDL 76 Labs (4/21): K 3.8,creatinine 1.02 Labs (7/21): K 3.9, creatinine 0.86  Allergies:  1)  ! Pcn  Past Medical History: 1. Nonischemic Cardiomyopathy: LHC (3/11) showed EF 15%, 40% ostial CFX, 40% prox RCA, 40-50% mid RCA.  Echo (3/11): Moderately dilated LV, EF 20-25%, global HK, severe diastolic dysfunction, mild RV dilation and dysfunction, moderate MR. SPEP/UPEP negative, ANA negative, HIV negative, TSH normal, no drug history.  Moderate ETOH use.  Highest suspicion probably viral myocarditis.   Cardiac MRI (4/11) showed a moderately dilated LV with global systolic dysfunction, EF 48%, normal RV size with moderate systolic dysfunction, small  area of nonspecific subepicardial basal inferoseptal (RV insertion site) delayed enhancement.  Repeat echo (9/11): EF 40% with mild to moderate diffuse hypokinesis.  Echo (4/14) with EF 55%, septal hypokinesis, mild MR.  - Echo (5/16): EF 50-55%, moderate MR. - Echo (7/17): EF 30-35%, mild MR.   - LHC (9/17) with nonobstructive CAD.  - Echo (2/18): EF 50%, diffuse hypokinesis, normal RV size and systolic function.  - Echo (1/20): EF 40-45%, normal RV size and systolic  function.  - Echo (4/21): EF 40-45%, normal RV  2. Nonobstructive CAD:  - LHC (3/11) showed EF 15%, 40% ostial CFX, 40% prox RCA, 40-50% mid RCA.  - Cardiolite (8/17) with EF 36%, possible inferior MI.  - LHC (9/17) with EF 45%, 50% ostial stenosis small AV LCx, diffuse RCA disease, 40% proximal/50% mid.  3. Smoking 4. Hyperlipidemia  Family History: The patient is adopted so does not know his family history.   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  smoked half pack per day for over 30 years, quit in 3/21.  Alcohol Use - yes, 2-3 drinks about 3-4 times a week in the past, has now cut back. Married, has two children.    ROS: All systems reviewed and negative except as per HPI.   Current Outpatient Medications  Medication Sig Dispense Refill  . aspirin (ASPIR-81) 81 MG EC tablet Take 1 tablet (81 mg total) by mouth daily. 90 tablet 3  . atorvastatin (LIPITOR) 20 MG tablet Take 1 tablet (20 mg total) by mouth daily. 90 tablet 3  . carvedilol (COREG) 25 MG tablet TAKE 1 TABLET TWICE A DAY WITH MEALS 180 tablet 3  . icosapent Ethyl (VASCEPA) 1 g capsule Take 2 capsules (2 g total) by mouth 2 (two) times daily. 360 capsule 3  . isosorbide-hydrALAZINE (BIDIL) 20-37.5 MG tablet Take 1 tablet by mouth 3 (three) times daily. 135 tablet 11  . sacubitril-valsartan (ENTRESTO) 97-103 MG Take 1 tablet by mouth 2 (two) times daily. 180 tablet 3  . spironolactone (ALDACTONE) 25 MG tablet Take 1 tablet (25 mg total) by mouth daily. 7 tablet 0   No current facility-administered medications for this encounter.    BP 120/78   Pulse 77   Wt 76.7 kg (169 lb 3.2 oz)   SpO2 99%   BMI 25.73 kg/m  General: NAD Neck: No JVD, no thyromegaly or thyroid nodule.  Lungs: Clear to auscultation bilaterally with normal respiratory effort. CV: Nondisplaced PMI.  Heart regular S1/S2, no S3/S4, no murmur.  No peripheral edema.  No carotid bruit.  Normal pedal pulses.   Abdomen: Soft, nontender, no hepatosplenomegaly, no distention.  Skin: Intact without lesions or rashes.  Neurologic: Alert and oriented x 3.  Psych: Normal affect. Extremities: No clubbing or cyanosis.  HEENT: Normal.   Assessment/Plan: 1. Chronic systolic CHF: History of nonischemic cardiomyopathy, had recovered to near normal on 2016 echo but EF down to 30-35% on 7/17 echo.  Repeat LHC in 9/17 showed diffuse but nonobstructive CAD.  EF was up to 50% on 2/18 echo, but was down some on 1/20 echo to 40-45% and remained 40-45% on 4/21 echo. Not volume overloaded, NYHA class I-II symptoms.  He now has a LBBB but EF is not low enough for CRT.   - Continue Coreg and spironolactone, on goal doses.  - Continue Bidil 1 tab tid. He has not tolerated increase in the past.  - Continue Entresto 97/103 bid.  - BMET  today  - He has cut back on ETOH but still drinking moderately, encouraged him to keep ETOH minimal.  - I will arrange for repeat echo.  If EF remains low, add SGLT2 inhibitor.  2. HTN: BP controlled.    3. Hyperlipidemia: Check lipids.     4. Smoking: He has now quit.  5. CAD: Diffuse nonobstructive disease on 9/17 cath. Continue ASA 81 and atorvastatin with goal LDL < 70.   BMET in 3 months, followup in 6 months.   Loralie Champagne 12/23/2020

## 2021-01-29 ENCOUNTER — Ambulatory Visit (HOSPITAL_COMMUNITY)
Admission: RE | Admit: 2021-01-29 | Discharge: 2021-01-29 | Disposition: A | Discharge status: Home / Self Care | Payer: BC Managed Care – PPO | Source: Ambulatory Visit | Attending: Cardiology | Admitting: Cardiology

## 2021-01-29 ENCOUNTER — Other Ambulatory Visit: Payer: Self-pay

## 2021-01-29 DIAGNOSIS — I429 Cardiomyopathy, unspecified: Secondary | ICD-10-CM | POA: Insufficient documentation

## 2021-01-29 DIAGNOSIS — I5022 Chronic systolic (congestive) heart failure: Secondary | ICD-10-CM | POA: Diagnosis not present

## 2021-01-29 DIAGNOSIS — E785 Hyperlipidemia, unspecified: Secondary | ICD-10-CM | POA: Diagnosis not present

## 2021-01-29 DIAGNOSIS — I2510 Atherosclerotic heart disease of native coronary artery without angina pectoris: Secondary | ICD-10-CM | POA: Diagnosis not present

## 2021-01-29 LAB — ECHOCARDIOGRAM COMPLETE
Area-P 1/2: 2.85 cm2
Calc EF: 39.6 %
S' Lateral: 3.8 cm
Single Plane A2C EF: 41.9 %
Single Plane A4C EF: 40.5 %

## 2021-01-29 NOTE — Progress Notes (Signed)
  Echocardiogram 2D Echocardiogram has been performed.  Augustine Radar 01/29/2021, 10:01 AM

## 2021-02-04 ENCOUNTER — Telehealth (HOSPITAL_COMMUNITY): Payer: Self-pay

## 2021-02-04 DIAGNOSIS — I5022 Chronic systolic (congestive) heart failure: Secondary | ICD-10-CM

## 2021-02-04 MED ORDER — DAPAGLIFLOZIN PROPANEDIOL 10 MG PO TABS: 10.0000 mg | ORAL_TABLET | Freq: Every day | ORAL | 3 refills | Status: DC

## 2021-02-04 MED ORDER — DAPAGLIFLOZIN PROPANEDIOL 10 MG PO TABS: 10.0000 mg | ORAL_TABLET | Freq: Every day | ORAL | 0 refills | Status: DC

## 2021-02-04 NOTE — Telephone Encounter (Signed)
Pt aware of results and recommendations. Verbalized understanding. Questions answered. Will rtc next Friday for lab work.  F/u appt made with NP/PA

## 2021-02-04 NOTE — Telephone Encounter (Signed)
-----   Message from Laurey Morale, MD sent at 01/29/2021  1:04 PM EDT ----- EF lower, down to 25-30%.  Is he drinking heavily? Needs to quit if so.  Would have him start Farxiga 10 mg daily with BMET in 10 days.  Will need followup appt.

## 2021-02-04 NOTE — Telephone Encounter (Signed)
-----   Message from Dalton S McLean, MD sent at 01/29/2021  1:04 PM EDT ----- EF lower, down to 25-30%.  Is he drinking heavily? Needs to quit if so.  Would have him start Farxiga 10 mg daily with BMET in 10 days.  Will need followup appt.  

## 2021-02-05 ENCOUNTER — Other Ambulatory Visit (HOSPITAL_COMMUNITY): Payer: Self-pay

## 2021-02-05 ENCOUNTER — Telehealth (HOSPITAL_COMMUNITY): Payer: Self-pay | Admitting: Pharmacy Technician

## 2021-02-05 NOTE — Telephone Encounter (Signed)
Advanced Heart Failure Patient Advocate Encounter  Prior Authorization for Marcelline Deist has been submitted and approved.    PA# [id number] Effective dates: 02/05/21 through 02/05/22  Patients 30 co-pay is $155. With co-pay card, 30 day co-pay should be $0.  BIN [id number] PCN cn ID [id number] GROUP [id number]

## 2021-02-06 ENCOUNTER — Other Ambulatory Visit (HOSPITAL_COMMUNITY): Payer: Self-pay | Admitting: *Deleted

## 2021-02-06 ENCOUNTER — Other Ambulatory Visit (HOSPITAL_COMMUNITY): Payer: Self-pay

## 2021-02-06 MED ORDER — ENTRESTO 97-103 MG PO TABS: 1.0000 | ORAL_TABLET | Freq: Two times a day (BID) | ORAL | 3 refills | Status: DC

## 2021-02-06 MED ORDER — DAPAGLIFLOZIN PROPANEDIOL 10 MG PO TABS: 10.0000 mg | ORAL_TABLET | Freq: Every day | ORAL | 3 refills | Status: DC

## 2021-02-06 NOTE — Telephone Encounter (Signed)
Advanced Heart Failure Patient Advocate Encounter  Entresto co-pay card, 30 day co-pay $10.  BIN: [id number] PCN: [id number] Group: [id number] ID: [id number]  Called and spoke with patient. Sent Jasmine, (CMA) a request to send Sherryll Burger and co[facility]pay card to [facility], per patient's request. Marcelline Deist co-pay card sent [facility] along with RX already.   Archer Asa, CPhT

## 2021-02-08 ENCOUNTER — Telehealth: Payer: Self-pay | Admitting: Student

## 2021-02-08 NOTE — Telephone Encounter (Addendum)
    The patient called the after hours line reporting his pharmacy did not have the 30-day co-pay card for his [place]. I called [facility] and read off the co-pay card information which had been included in the E-script sent over. They now have the information and should know later today if approved. Patient updated as well. Will route to AHF team members who previously submitted this on 6/3 so they can follow-up tomorrow if not approved.   Signed, Ellsworth Lennox, PA-C 02/08/2021, 3:15 PM Pager: [phone]

## 2021-02-09 ENCOUNTER — Telehealth (HOSPITAL_COMMUNITY): Payer: Self-pay | Admitting: Pharmacy Technician

## 2021-02-09 ENCOUNTER — Other Ambulatory Visit (HOSPITAL_COMMUNITY): Payer: Self-pay

## 2021-02-09 ENCOUNTER — Other Ambulatory Visit (HOSPITAL_COMMUNITY): Payer: Self-pay | Admitting: Cardiology

## 2021-02-09 DIAGNOSIS — I5022 Chronic systolic (congestive) heart failure: Secondary | ICD-10-CM

## 2021-02-09 NOTE — Telephone Encounter (Signed)
Advanced Heart Failure Patient Advocate Encounter  I received a message from [place], PA-C regarding this patient's Marcelline Deist issue at the pharmacy. She called over the weekend and gave [facility] the co-pay card information that was attached to the electronic prescription. I called [facility] to confirm that the co-pay card was attached successfully. Insurance is showing that on 6/1 the Marcelline Deist was mailed to the patient.[facility] would not be able to get insurance to pay for the medication, as there is an active claim elsewhere.  Upon investigation, it looks like the RX was sent to [facility] before being [facility]. The patient would be able to use the co-pay card at [facility] on his next refill. Not sure if the patient authorized [facility] to send out the medication. Called and left the patient a detailed message with the above information.  Archer Asa, CPhT

## 2021-02-13 ENCOUNTER — Other Ambulatory Visit (HOSPITAL_COMMUNITY): Payer: BC Managed Care – PPO

## 2021-02-19 ENCOUNTER — Ambulatory Visit (HOSPITAL_COMMUNITY)
Admission: RE | Admit: 2021-02-19 | Discharge: 2021-02-19 | Disposition: A | Discharge status: Home / Self Care | Payer: BC Managed Care – PPO | Source: Ambulatory Visit | Attending: Family Medicine | Admitting: Family Medicine

## 2021-02-19 ENCOUNTER — Encounter (HOSPITAL_COMMUNITY): Payer: Self-pay

## 2021-02-19 ENCOUNTER — Other Ambulatory Visit: Payer: Self-pay

## 2021-02-19 VITALS — BP 124/79 | HR 66 | Wt 162.6 lb

## 2021-02-19 DIAGNOSIS — I2510 Atherosclerotic heart disease of native coronary artery without angina pectoris: Secondary | ICD-10-CM

## 2021-02-19 DIAGNOSIS — I447 Left bundle-branch block, unspecified: Secondary | ICD-10-CM | POA: Insufficient documentation

## 2021-02-19 DIAGNOSIS — I5022 Chronic systolic (congestive) heart failure: Secondary | ICD-10-CM | POA: Diagnosis not present

## 2021-02-19 DIAGNOSIS — I110 Hypertensive heart disease with heart failure: Secondary | ICD-10-CM | POA: Insufficient documentation

## 2021-02-19 DIAGNOSIS — I428 Other cardiomyopathies: Secondary | ICD-10-CM | POA: Insufficient documentation

## 2021-02-19 DIAGNOSIS — Z87891 Personal history of nicotine dependence: Secondary | ICD-10-CM

## 2021-02-19 DIAGNOSIS — I10 Essential (primary) hypertension: Secondary | ICD-10-CM

## 2021-02-19 DIAGNOSIS — Z7982 Long term (current) use of aspirin: Secondary | ICD-10-CM | POA: Insufficient documentation

## 2021-02-19 DIAGNOSIS — Z7984 Long term (current) use of oral hypoglycemic drugs: Secondary | ICD-10-CM | POA: Diagnosis not present

## 2021-02-19 DIAGNOSIS — E785 Hyperlipidemia, unspecified: Secondary | ICD-10-CM | POA: Diagnosis not present

## 2021-02-19 DIAGNOSIS — Z79899 Other long term (current) drug therapy: Secondary | ICD-10-CM | POA: Insufficient documentation

## 2021-02-19 LAB — BASIC METABOLIC PANEL
Anion gap: 5 (ref 5–15)
BUN: 9 mg/dL (ref 6–20)
CO2: 29 mmol/L (ref 22–32)
Calcium: 9.3 mg/dL (ref 8.9–10.3)
Chloride: 106 mmol/L (ref 98–111)
Creatinine, Ser: 1 mg/dL (ref 0.61–1.24)
GFR, Estimated: >60 mL/min (ref >60)
Glucose, Bld: 97 mg/dL (ref 70–99)
Potassium: 4.4 mmol/L (ref 3.5–5.1)
Sodium: 140 mmol/L (ref 135–145)

## 2021-02-19 NOTE — Patient Instructions (Signed)
It was great to see you today! No medication changes are needed at this time.  Labs today We will only contact you if something comes back abnormal or we need to make some changes. Otherwise no news is good news!  You have been referred to [facility] -they will be in contact for an appointment  Do the following things EVERYDAY: Weigh yourself in the morning before breakfast. Write it down and keep it in a log. Take your medicines as prescribed Eat low salt foods--Limit salt (sodium) to 2000 mg per day.  Stay as active as you can everyday Limit all fluids for the day to less than 2 liters  At the [facility], you and your health needs are our priority. As part of our continuing mission to provide you with exceptional heart care, we have created designated Provider Care Teams. These Care Teams include your primary Cardiologist (physician) and Advanced Practice Providers (APPs- Physician Assistants and Nurse Practitioners) who all work together to provide you with the care you need, when you need it.   You may see any of the following providers on your designated Care Team at your next follow up: Dr Arvilla Meres Dr Marca Ancona Dr Brandon Melnick, NP Robbie Lis, [place] Mikki Santee Karle Plumber, PharmD   Please be sure to bring in all your medications bottles to every appointment.   If you have any questions or concerns before your next appointment please send [place] a message through [place] or call our office at [phone].    TO LEAVE A MESSAGE FOR THE NURSE SELECT OPTION 2, PLEASE LEAVE A MESSAGE INCLUDING: YOUR NAME DATE OF BIRTH CALL BACK NUMBER REASON FOR CALL**this is important as we prioritize the call backs  YOU WILL RECEIVE A CALL BACK THE SAME DAY AS LONG AS YOU CALL BEFORE 4:00 PM

## 2021-02-19 NOTE — Progress Notes (Signed)
Patient ID: [name], male   DOB: [birth year], 60 y.o.   MRN: [id number] PCP: Dr. Nancy Fetter Cardiology: Dr. Aundra Dubin  60 y.o. returns for followup of nonischemic cardiomyopathy.  Patient presented to [facility] in 3/11 after 2-3 weeks of shortness of breath.  He had a flu-like illness with congestion and myalgias and had been treated with clarithromycin as an outpatient.  However, he became progressively short of breath so went into the hospital.  He was noted to have BNP 976 and pulmonary edema on CXR.  Echo showed EF 20-25%.  Left heart cath was done showing mild coronary disease, suggesting that this was likely a nonischemic cardiomyopathy.  Patient was diuresed and begun on cardiac meds.  Cardiac MRI showed EF 20% without significant delayed enhancement, so no evidence for infiltrative disease.  Echo was repeated 9/11, showing some improvement.  Would estimate EF at 40% with diffuse hypokinesis.  Echo in 4/14 showed EF 55% with septal hypokinesis.  5/16 echo also showed EF 50-55% range.   Echo in 7/17 showed EF back down to 30-35%.  He had not been taking his medications regularly (thinks he was missing about 50% of doses).  Cardiolite done in 8/17, showing EF 36% and possible inferior MI.  Repeat cardiac cath in 9/17 after abnormal Cardiolite showed diffuse but nonobstructive CAD.  Repeat echo in 2/18 showed EF back up to 50%.    Echo in 1/20 showed EF 40-45% with normal RV size and systolic function. He was started back on Bidil.  Echo in 4/21 showed EF 40-45%, mild diffuse hypokinesis, normal RV.   Clinic visit 4/22 with Dr. Aundra Dubin, weight was up 9 lbs and drinking about 3 beers 3-4x/week. Repeat echo showed lowered EF, Farxiga started.  Today he returns for HF follow up. Overall feeling fine. SOB with cutting grass and occasionally with stairs. Denies increasing SOB, CP, dizziness, edema, or PND/Orthopnea. Appetite ok. No fever or chills. Weight at home 170 pounds. Taking all medications. He  continues to work as a Presenter, broadcasting, now at [employer], now on 1st shift. Drinking ETOH on weekends 3-4 shots/day.   ECG (personally reviewed): SR 62 bpm, LBBB, qrs 162 ms  Labs (3/11): UPEP/SPEP negative, ANA negative, urine drug screen negative, HIV negative, TSH normal, LDL 158, HDL 106, TGs 200, BNP 976, ESR 3, K 4, creatinine 1.01 Labs (4/11): BNP 212, K 4.2, creatinine 0.97 Labs (7/11): BNP 81, K 4.1, creatinine 0.9 Labs (10/11): K 4.2, creatinine 0.8, LDL 98, HDL 68 Labs (1/12): K 3.8, creatinine 0.9, BNP 70 Labs (8/12): K 3.8, creatinine 0.9, HDL 90, LDL 48 Labs (3/14): K 3.5, creatinine 0.9 Labs (5/16): LDL 85 Labs (12/16): K 3.9, creatinine 0.86 Labs (7/17): LDL 55, HDL 146, BNP 24, K 3.7, creatinine 0.7 Labs (8/17): K 3.3, creatinine 0.83 Labs (9/17): K 3.9, creatinine 0.85 Labs (10/17): K 3.8, creatinine 1.05 Labs (5/18): K 3.6, creatinine 0.91 Labs (8/18): creatinine 1.12 Labs (12/18): LDL 68 Labs (12/20): BNP 42, K 4, creatinine 1.04, LDL 75, HDL 76 Labs (4/21): K 3.8,creatinine 1.02 Labs (7/21): K 3.9, creatinine 0.86 Labs (5/22): K 4.5, creatinine 0.97, LDL 65, HDL 67, TG 214 Labs (6/22): K 4.4, creatinine 1.00  Allergies:  1)  ! PCN  Past Medical History: 1. Nonischemic Cardiomyopathy: LHC (3/11) showed EF 15%, 40% ostial CFX, 40% prox RCA, 40-50% mid RCA.  Echo (3/11): Moderately dilated LV, EF 20-25%, global HK, severe diastolic dysfunction, mild RV dilation and dysfunction, moderate MR. SPEP/UPEP negative, ANA  negative, HIV negative, TSH normal, no drug history.  Moderate ETOH use.  Highest suspicion probably viral myocarditis.   Cardiac MRI (4/11) showed a moderately dilated LV with global systolic dysfunction, EF 39%, normal RV size with moderate systolic dysfunction, small area of nonspecific subepicardial basal inferoseptal (RV insertion site) delayed enhancement.  Repeat echo (9/11): EF 40% with mild to moderate diffuse hypokinesis.  Echo (4/14) with EF  55%, septal hypokinesis, mild MR.  - Echo (5/16): EF 50-55%, moderate MR. - Echo (7/17): EF 30-35%, mild MR.   - LHC (9/17) with nonobstructive CAD.  - Echo (2/18): EF 50%, diffuse hypokinesis, normal RV size and systolic function.  - Echo (1/20): EF 40-45%, normal RV size and systolic function.  - Echo (4/21): EF 40-45%, normal RV  - Echo (5/22): EF 25-35%, normal RV, Grade I DD. 2. Nonobstructive CAD:  - LHC (3/11) showed EF 15%, 40% ostial CFX, 40% prox RCA, 40-50% mid RCA.  - Cardiolite (8/17) with EF 36%, possible inferior MI.  - LHC (9/17) with EF 45%, 50% ostial stenosis small AV LCx, diffuse RCA disease, 40% proximal/50% mid.  3. Smoking 4. Hyperlipidemia  Family History: The patient is adopted so does not know his family history.   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  smoked half pack per day for over 30 years, quit in 3/21.  Alcohol Use - yes, 2-3 drinks about 3-4 times a week in the past, has now cut back. Married, has two children.    ROS: All systems reviewed and negative except as per HPI.   Current Outpatient Medications  Medication Sig Dispense Refill   aspirin (ASPIR-81) 81 MG EC tablet Take 1 tablet (81 mg total) by mouth daily. 90 tablet 3   atorvastatin (LIPITOR) 20 MG tablet TAKE 1 TABLET DAILY 90 tablet 3   carvedilol (COREG) 25 MG tablet TAKE 1 TABLET TWICE A DAY WITH MEALS 180 tablet 3   dapagliflozin propanediol (FARXIGA) 10 MG TABS tablet Take 1 tablet (10 mg total) by mouth daily before breakfast. 30 tablet 3   icosapent Ethyl (VASCEPA) 1 g capsule Take 2 capsules (2 g total) by mouth 2 (two) times daily. 360 capsule 3   isosorbide-hydrALAZINE (BIDIL) 20-37.5 MG tablet Take 1 tablet by mouth 3 (three) times daily. 135 tablet 11   sacubitril-valsartan (ENTRESTO) 97-103 MG Take 1 tablet by mouth 2 (two) times daily. 60 tablet 3   spironolactone (ALDACTONE) 25 MG tablet TAKE 1 TABLET DAILY 90 tablet 3   No  current facility-administered medications for this encounter.   BP 124/79   Pulse 66   Wt 73.8 kg (162 lb 9.6 oz)   SpO2 99%   BMI 24.72 kg/m   Wt Readings from Last 3 Encounters:  02/19/21 73.8 kg (162 lb 9.6 oz)  12/23/20 76.7 kg (169 lb 3.2 oz)  11/18/20 76.2 kg (168 lb)   General:  NAD. No resp difficulty HEENT: Normal Neck: Supple. No JVD. Carotids 2+ bilat; no bruits. No lymphadenopathy or thryomegaly appreciated. Cor: PMI nondisplaced. Regular rate & rhythm. No rubs, gallops or murmurs. Lungs: Clear Abdomen: Soft, nontender, nondistended. No hepatosplenomegaly. No bruits or masses. Good bowel sounds. Extremities: No cyanosis, clubbing, rash, edema Neuro: alert & oriented x 3, cranial nerves grossly intact. Moves all 4 extremities w/o difficulty. Affect pleasant.  Assessment/Plan: 1. Chronic systolic CHF: History of nonischemic cardiomyopathy, had recovered to near normal on 2016 echo but EF down to 30-35% on 7/17  echo.  Repeat LHC in 9/17 showed diffuse but nonobstructive CAD.  EF was up to 50% on 2/18 echo, but was down some on 1/20 echo to 40-45% and remained 40-45% on 4/21 echo. Echo 5/22 with lowered EF 25-30%, RV ok. He is not volume overloaded, NYHA class I-II symptoms.  He now has a LBBB, EF back down, QRS today on ECG 162 ms. Worthwhile to have him see EP to discuss CRT. Will arrange.  - Continue Coreg 25 mg bid. - Continue spiro 25 mg daily. - Continue Bidil 1 tab tid. He has not tolerated increase in the past.  - Continue Entresto 97/103 mg bid.  - Continue Farxiga 10 mg daily. Check A1C today. - BMET today.  - He has cut back on ETOH but still drinking moderately, encouraged him to keep ETOH minimal.  2. HTN: BP controlled.  Had sleep study in 3/22, no evidence of OSA. 3. Hyperlipidemia: Lipids ok (4/22).    4. Smoking: He has now quit.  5. CAD: Diffuse nonobstructive disease on 9/17 cath. Continue ASA 81 and atorvastatin with goal LDL < 70.   Follow up with APP  in 1-2 months and with Dr. Aundra Dubin in 4 months.  [place], FNP-BC 02/19/2021

## 2021-02-20 LAB — HEMOGLOBIN A1C
Hgb A1c MFr Bld: 5.9 % — ABNORMAL HIGH (ref 4.8–5.6)
Mean Plasma Glucose: 123 mg/dL

## 2021-02-28 ENCOUNTER — Telehealth: Payer: Self-pay | Admitting: Physician Assistant

## 2021-02-28 NOTE — Telephone Encounter (Signed)
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  He called in b/c he has URI symptoms with sore throat, congestion and cough.  He wanted to know what meds are safe for him to take.  He has not had any weight gain or shortness of breath. He has not had any swelling.    PLAN:  I asked him to monitor for weight gain, shortness of breath and swelling. He knows to call us if this occurs. I asked him to seek out testing for COVID-19. I explained that he can take Coricidin HBP, guaifenesin, flonase as needed for URI symptoms.  Tereso Newcomer, PA-C    02/28/2021 2:31 PM

## 2021-03-01 ENCOUNTER — Telehealth: Payer: Self-pay | Admitting: Cardiology

## 2021-03-01 ENCOUNTER — Encounter (HOSPITAL_COMMUNITY): Payer: Self-pay | Admitting: Emergency Medicine

## 2021-03-01 ENCOUNTER — Other Ambulatory Visit: Payer: Self-pay

## 2021-03-01 ENCOUNTER — Emergency Department (HOSPITAL_COMMUNITY)
Admission: EM | Admit: 2021-03-01 | Discharge: 2021-03-01 | Disposition: A | Discharge status: Home / Self Care | Payer: BC Managed Care – PPO | Attending: Emergency Medicine | Admitting: Emergency Medicine

## 2021-03-01 ENCOUNTER — Emergency Department (HOSPITAL_COMMUNITY): Payer: BC Managed Care – PPO

## 2021-03-01 DIAGNOSIS — I5022 Chronic systolic (congestive) heart failure: Secondary | ICD-10-CM | POA: Insufficient documentation

## 2021-03-01 DIAGNOSIS — U071 COVID-19: Secondary | ICD-10-CM | POA: Diagnosis not present

## 2021-03-01 DIAGNOSIS — Z79899 Other long term (current) drug therapy: Secondary | ICD-10-CM | POA: Diagnosis not present

## 2021-03-01 DIAGNOSIS — Z87891 Personal history of nicotine dependence: Secondary | ICD-10-CM | POA: Insufficient documentation

## 2021-03-01 DIAGNOSIS — R059 Cough, unspecified: Secondary | ICD-10-CM | POA: Diagnosis not present

## 2021-03-01 DIAGNOSIS — Z7982 Long term (current) use of aspirin: Secondary | ICD-10-CM | POA: Insufficient documentation

## 2021-03-01 DIAGNOSIS — I2510 Atherosclerotic heart disease of native coronary artery without angina pectoris: Secondary | ICD-10-CM | POA: Insufficient documentation

## 2021-03-01 DIAGNOSIS — I110 Hypertensive heart disease with heart failure: Secondary | ICD-10-CM | POA: Diagnosis not present

## 2021-03-01 MED ORDER — BENZONATATE 100 MG PO CAPS: 100.0000 mg | ORAL_CAPSULE | Freq: Three times a day (TID) | ORAL | 0 refills | Status: DC | PRN

## 2021-03-01 MED ORDER — MOLNUPIRAVIR EUA 200MG CAPSULE: 4.0000 | ORAL_CAPSULE | Freq: Two times a day (BID) | ORAL | 0 refills | Status: AC

## 2021-03-01 NOTE — Telephone Encounter (Signed)
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  He called in yesterday and talked to Tereso Newcomer, [place] b/c he has URI symptoms with sore throat, congestion and cough.  He wanted to know what meds are safe for him to take.  He had not had any weight gain or shortness of breath. I was recommended that he take a COVID 19 test and it is positive.    He is now calling stating that he tested positive for COVID 19 and is concerned.  He denies chest pain except from coughing and denies and SOB or LE edema, PND or orthopnea.  He has a lot of coughing.  He denies any fever.  He says that his symptoms started on Friday and now 2 days into it.  He is really coughing to the point he cannot sleep.  I have recommended he go to ER for evaluation and Cxray to make sure he does not have Pneumonia.

## 2021-03-01 NOTE — ED Triage Notes (Signed)
Patient tested positive for COVID19 at [facility] today , requesting chest x-ray , denies SOB , no fever or chills , occasional dry cough .

## 2021-03-01 NOTE — Discharge Instructions (Addendum)
Your chest x ray is normal.  The [facility] outpatient pharmacy carries the molnupiravir. Take this every 12 hours as directed. You can take Tylenol/acetaminophen  every 6 hours for sore throat, body aches, headache or fever.  Drink plenty of water.  Use saline nasal spray for congestion. You can take Tessalon every 8 hours as needed for cough. Wash your hands frequently. Please isolate at home for at least 5 days after the day your symptoms initially began, and THEN at least 24 hours after you are fever-free without the help of medications AND your symptoms are improving. Only once your symptoms are improving and you are fever-free can you come out out of quarantine. Once, then you should wear a mask in public for another 5 days. Follow up with your primary care provider. Return to the ER for significant shortness of breath, uncontrollable vomiting, severe chest pain, or other concerning symptoms.

## 2021-03-01 NOTE — ED Provider Notes (Signed)
[facility] EMERGENCY DEPARTMENT Provider Note   CSN: [id number] Arrival date & time: 03/01/21  1927     History Chief Complaint  Patient presents with   Covid+    [name] is a 60 y.o. male past medical history of CHF, CAD, hyperlipidemia, hypertension, presenting to the emergency department with positive COVID test today.  Symptoms began on Friday with dry cough and fatigue.  No fevers, chills, abdominal complaints, shortness of breath or chest pain.  He is vaccinated and boosted x1 against COVID.  He called his cardiologist recommended he come in for chest x-ray and antivirals.  The history is provided by the patient.       Past Medical History:  Diagnosis Date   CAD (coronary artery disease)    nonobstructive: LHC as above with mild disease   CHF (congestive heart failure) (HCC)    Chronic systolic heart failure (HCC)    History of echocardiogram    Echo 5/16:  EF 50-55%, diff HK, Gr 1 DD, mod MR, LA upper limits normal, mobile atrial septum (cannot r/p PFO)   HLD (hyperlipidemia)    Nonischemic cardiomyopathy (HCC)    LHC 3/11 showed EF 15%, 40% ostial CFX, 40% prox RCA, 40-50% mid RCa. Echo 3/11 moderate dilated LV, EF 20-25% global hk   Personal history of unspecified circulatory disease     Patient Active Problem List   Diagnosis Date Noted   Essential hypertension 10/10/2015   Hyperlipidemia 10/10/2015   Mitral regurgitation 10/10/2015   SMOKER 11/26/2009   CAD (coronary artery disease) 11/26/2009   Chronic systolic CHF (congestive heart failure) (HCC) 11/26/2009    Past Surgical History:  Procedure Laterality Date   CARDIAC CATHETERIZATION     6 years ago   CARDIAC CATHETERIZATION N/A 05/26/2016   Procedure: Left Heart Cath and Coronary Angiography;  Surgeon: Laurey Morale, MD;  Location: [facility] INVASIVE CV LAB;  Service: Cardiovascular;  Laterality: N/A;       Family History  Adopted: Yes  Family history unknown: Yes     Social History   Tobacco Use   Smoking status: Former    Pack years: 0.00    Types: Cigarettes   Smokeless tobacco: Never   Tobacco comments:    smoked 1/2 ppd for over 30 years, now down to 3-4 cigs/day  Substance Use Topics   Alcohol use: Yes    Comment: usu liquor three times a week (3-4 drinks at a time)    Drug use: Not Currently    Home Medications Prior to Admission medications   Medication Sig Start Date End Date Taking? Authorizing Provider  benzonatate (TESSALON) 100 MG capsule Take 1 capsule (100 mg total) by mouth 3 (three) times daily as needed for cough. 03/01/21  Yes Errol Ala, [place] N, PA-C  molnupiravir EUA 200 mg CAPS Take 4 capsules (800 mg total) by mouth 2 (two) times daily for 5 days. 03/01/21 03/06/21 Yes Lendell Gallick, [place] N, PA-C  aspirin (ASPIR-81) 81 MG EC tablet Take 1 tablet (81 mg total) by mouth daily. 03/17/17   Bensimhon, Bevelyn Buckles, MD  atorvastatin (LIPITOR) 20 MG tablet TAKE 1 TABLET DAILY 02/12/21   Laurey Morale, MD  carvedilol (COREG) 25 MG tablet TAKE 1 TABLET TWICE A DAY WITH MEALS 07/22/20   Laurey Morale, MD  dapagliflozin propanediol (FARXIGA) 10 MG TABS tablet Take 1 tablet (10 mg total) by mouth daily before breakfast. 02/06/21   Laurey Morale, MD  icosapent Ethyl (VASCEPA)  1 g capsule Take 2 capsules (2 g total) by mouth 2 (two) times daily. 02/01/20   Laurey Morale, MD  isosorbide-hydrALAZINE (BIDIL) 20-37.5 MG tablet Take 1 tablet by mouth 3 (three) times daily. 09/15/18   Laurey Morale, MD  sacubitril-valsartan (ENTRESTO) 97-103 MG Take 1 tablet by mouth 2 (two) times daily. 02/06/21   Laurey Morale, MD  spironolactone (ALDACTONE) 25 MG tablet TAKE 1 TABLET DAILY 02/12/21   Laurey Morale, MD    Allergies    Penicillins  Review of Systems   Review of Systems  All other systems reviewed and are negative.  Physical Exam Updated Vital Signs BP 140/90   Pulse (!) 101   Temp 99.2 F (37.3 C) (Oral)   Resp (!) 22   Ht 5'  8" (1.727 m)   Wt 80 kg   SpO2 99%   BMI 26.82 kg/m   Physical Exam Vitals and nursing note reviewed.  Constitutional:      General: He is not in acute distress.    Appearance: He is well-developed. He is not ill-appearing.  HENT:     Head: Normocephalic and atraumatic.  Eyes:     Conjunctiva/sclera: Conjunctivae normal.  Cardiovascular:     Rate and Rhythm: Normal rate and regular rhythm.  Pulmonary:     Effort: Pulmonary effort is normal. No respiratory distress.     Breath sounds: Normal breath sounds.  Abdominal:     General: Bowel sounds are normal.     Palpations: Abdomen is soft.     Tenderness: There is no abdominal tenderness.  Skin:    General: Skin is warm.  Neurological:     Mental Status: He is alert.  Psychiatric:        Behavior: Behavior normal.    ED Results / Procedures / Treatments   Labs (all labs ordered are listed, but only abnormal results are displayed) Labs Reviewed - No data to display  EKG None  Radiology DG Chest Portable 1 View  Result Date: 03/01/2021 CLINICAL DATA:  COVID, cough EXAM: PORTABLE CHEST 1 VIEW COMPARISON:  None. FINDINGS: Heart and mediastinal contours are within normal limits. No focal opacities or effusions. No acute bony abnormality. IMPRESSION: No active disease. Electronically Signed   By: Charlett Nose M.D.   On: 03/01/2021 20:38    Procedures Procedures   Medications Ordered in ED Medications - No data to display  ED Course  I have reviewed the triage vital signs and the nursing notes.  Pertinent labs & imaging results that were available during my care of the patient were reviewed by me and considered in my medical decision making (see chart for details).    MDM Rules/Calculators/A&P                          Patient is here for chest x-ray and antivirals in the setting of COVID-19 illness.  Tested positive at [facility] today, he provides positive result on his phone.  Symptoms began on Friday.  He does have  risk factors and qualifies for antiviral therapy.  He has some medication interactions with Paxlovid, therefore will prescribe molnupiravir.  Chest x-ray is clear.  Instructed to take Coricidin for symptom relief, will prescribe Tessalon.  Outpatient follow-up and return precautions discussed.  Home isolation precautions per current CDC guidelines.  He is well-appearing discharged in no distress.  [name] was evaluated in Emergency Department on 03/01/2021 for the symptoms  described in the history of present illness. He was evaluated in the context of the global COVID-19 pandemic, which necessitated consideration that the patient might be at risk for infection with the SARS-CoV-2 virus that causes COVID-19. Institutional protocols and algorithms that pertain to the evaluation of patients at risk for COVID-19 are in a state of rapid change based on information released by regulatory bodies including the CDC and federal and state organizations. These policies and algorithms were followed during the patient's care in the ED.  Discussed results, findings, treatment and follow up. Patient advised of return precautions. Patient verbalized understanding and agreed with plan.  Final Clinical Impression(s) / ED Diagnoses Final diagnoses:  COVID-19    Rx / DC Orders ED Discharge Orders          Ordered    molnupiravir EUA 200 mg CAPS  2 times daily        03/01/21 2053    benzonatate (TESSALON) 100 MG capsule  3 times daily PRN        03/01/21 2053             Maryjayne Kleven, [place] N, PA-C 03/01/21 2055    Arby Barrette, MD 03/05/21 [phone]

## 2021-03-02 NOTE — Telephone Encounter (Signed)
If he goes home from the ER, he would be a candidate for paxlovid treatment.  Can have pharmacist review his medications and dose.

## 2021-03-04 ENCOUNTER — Encounter: Payer: Self-pay | Admitting: Internal Medicine

## 2021-03-24 ENCOUNTER — Other Ambulatory Visit (HOSPITAL_COMMUNITY): Payer: BC Managed Care – PPO

## 2021-04-01 ENCOUNTER — Other Ambulatory Visit (HOSPITAL_COMMUNITY): Payer: Self-pay | Admitting: Cardiology

## 2021-04-01 IMAGING — DX DG CHEST 1V PORT
1 series · 1 of 1 positions shown · non-contrast
Comparison: August 25, 2015

CLINICAL DATA: Shortness of breath since yesterday

EXAM:
PORTABLE CHEST 1 VIEW

[chest ap]
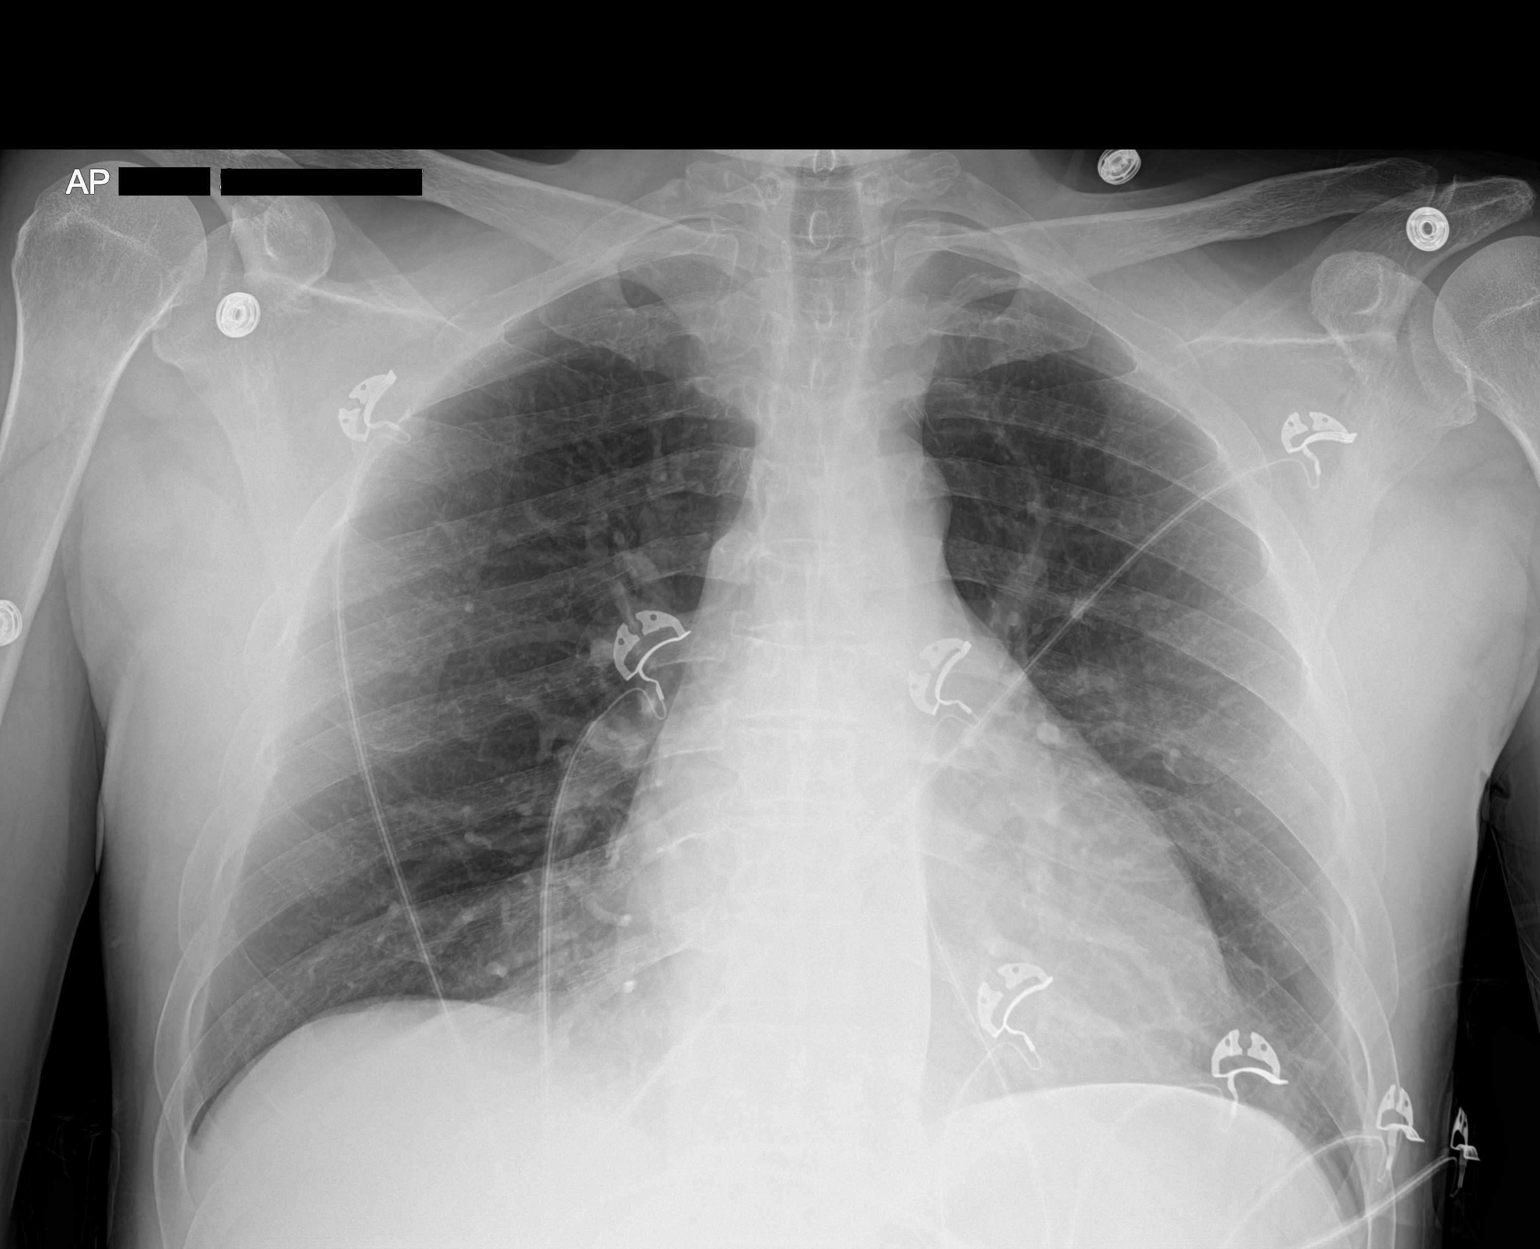

[1 of 1 positions shown; findings below may reference images not displayed]

FINDINGS: The heart size and mediastinal contours are within normal limits.
Both lungs are clear. The visualized skeletal structures are
unremarkable.
IMPRESSION: No active disease.

## 2021-04-02 ENCOUNTER — Ambulatory Visit (HOSPITAL_COMMUNITY)
Admission: RE | Admit: 2021-04-02 | Discharge: 2021-04-02 | Disposition: A | Discharge status: Home / Self Care | Payer: BC Managed Care – PPO | Source: Ambulatory Visit | Attending: Cardiology | Admitting: Cardiology

## 2021-04-02 ENCOUNTER — Other Ambulatory Visit: Payer: Self-pay

## 2021-04-02 DIAGNOSIS — I5022 Chronic systolic (congestive) heart failure: Secondary | ICD-10-CM

## 2021-04-02 LAB — BASIC METABOLIC PANEL
Anion gap: 6 (ref 5–15)
BUN: 10 mg/dL (ref 6–20)
CO2: 28 mmol/L (ref 22–32)
Calcium: 9 mg/dL (ref 8.9–10.3)
Chloride: 108 mmol/L (ref 98–111)
Creatinine, Ser: 0.95 mg/dL (ref 0.61–1.24)
GFR, Estimated: >60 mL/min (ref >60)
Glucose, Bld: 116 mg/dL — ABNORMAL HIGH (ref 70–99)
Potassium: 4.3 mmol/L (ref 3.5–5.1)
Sodium: 142 mmol/L (ref 135–145)

## 2021-04-21 ENCOUNTER — Encounter: Payer: Self-pay | Admitting: Internal Medicine

## 2021-04-21 ENCOUNTER — Other Ambulatory Visit: Payer: Self-pay

## 2021-04-21 ENCOUNTER — Ambulatory Visit (INDEPENDENT_AMBULATORY_CARE_PROVIDER_SITE_OTHER): Payer: BC Managed Care – PPO | Admitting: Internal Medicine

## 2021-04-21 VITALS — BP 122/78 | HR 72 | Ht 68.0 in | Wt 160.0 lb

## 2021-04-21 DIAGNOSIS — I5022 Chronic systolic (congestive) heart failure: Secondary | ICD-10-CM | POA: Diagnosis not present

## 2021-04-21 NOTE — Patient Instructions (Signed)
Medication Instructions:  Your physician recommends that you continue on your current medications as directed. Please refer to the Current Medication list given to you today.  *If you need a refill on your cardiac medications before your next appointment, please call your pharmacy*   Lab Work: None ordered.  If you have labs (blood work) drawn today and your tests are completely normal, you will receive your results only by: MyChart Message (if you have MyChart) OR A paper copy in the mail If you have any lab test that is abnormal or we need to change your treatment, we will call you to review the results.   Testing/Procedures: Your physician has recommended that you have a defibrillator inserted. An implantable cardioverter defibrillator (ICD) is a small device that is placed in your chest or, in rare cases, your abdomen. This device uses electrical pulses or shocks to help control life-threatening, irregular heartbeats that could lead the heart to suddenly stop beating (sudden cardiac arrest). Leads are attached to the ICD that goes into your heart. This is done in the hospital and usually requires an overnight stay. Please see the instruction sheet given to you today for more information.    Follow-Up: At [facility], you and your health needs are our priority.  As part of our continuing mission to provide you with exceptional heart care, we have created designated Provider Care Teams.  These Care Teams include your primary Cardiologist (physician) and Advanced Practice Providers (APPs -  Physician Assistants and Nurse Practitioners) who all work together to provide you with the care you need, when you need it.  We recommend signing up for the patient portal called "MyChart".  Sign up information is provided on this After Visit Summary.  MyChart is used to connect with patients for Virtual Visits (Telemedicine).  Patients are able to view lab/test results, encounter notes, upcoming  appointments, etc.  Non-urgent messages can be sent to your provider as well.   To learn more about what you can do with MyChart, go to [email or web address].    Your next appointment:   Follow up as needed with Dr Graciela Husbands

## 2021-04-21 NOTE — Progress Notes (Signed)
ELECTROPHYSIOLOGY CONSULT NOTE  Patient ID: [name], MRN: [id number], DOB/AGE: 1960/12/10 60 y.o. Admit date: (Not on file) Date of Consult: 04/21/2021  Primary Physician: Deatra James, MD Primary Cardiologist: DM     [name] is a 60 y.o. male who is being seen today for the evaluation of ICD/CRT at the request of DM.    HPI [name] is a 60 y.o. male referred for consideration of an ICD and CRT.  Initially presented with congestive heart failure 3/11.  Initial ejection fraction was about 20-25% and over the ensuing couple of years on guideline directed therapy his ejection fraction normalized (4/14-5/16.  with medication noncompliance, perhaps related, there was variations of his ejection fraction better and worse and most recently has fallen again as noted below.   The patient denies chest pain, nocturnal dyspnea, orthopnea or peripheral edema.  There have been no or syncope.  Complains of mild dyspnea on exertion with a second flight of stairs, and intermittent tachy palpitations, duration seconds, associated with lightheadedness.  Occurring typically a couple of times a month  Alcohol intake is a couple of drinks a week.  Marland Kitchen   DATE TEST EF   3/11 Echo   20-25 %   3/11 LHC  Non obstructive CAD  9/17 LHC 45% Non obstructive CAD  4/21 Echo  40-45%   5/22 Echo  25-35%    Date Cr K Hgb  7/22 0.95 4.3 12.8 (12/20)          ECG 4/21 LBBB  QRSd 154 monophasic QRS I,L and rS V6 ECG 4/22 IVCD QRSd 142 msec Monophasic upright QRS V6 but rS I,L ECG 6/22  IVCD  LBB like with monophasic fragemented upright QRS V6 but rSr' in I and L   Past Medical History:  Diagnosis Date   CAD (coronary artery disease)    nonobstructive: LHC as above with mild disease   CHF (congestive heart failure) (HCC)    Chronic systolic heart failure (HCC)    History of echocardiogram    Echo 5/16:  EF 50-55%, diff HK, Gr 1 DD, mod MR, LA upper limits normal, mobile  atrial septum (cannot r/p PFO)   HLD (hyperlipidemia)    Nonischemic cardiomyopathy (HCC)    LHC 3/11 showed EF 15%, 40% ostial CFX, 40% prox RCA, 40-50% mid RCa. Echo 3/11 moderate dilated LV, EF 20-25% global hk   Personal history of unspecified circulatory disease       Surgical History:  Past Surgical History:  Procedure Laterality Date   CARDIAC CATHETERIZATION     6 years ago   CARDIAC CATHETERIZATION N/A 05/26/2016   Procedure: Left Heart Cath and Coronary Angiography;  Surgeon: Laurey Morale, MD;  Location: [facility] INVASIVE CV LAB;  Service: Cardiovascular;  Laterality: N/A;     Home Meds: Current Meds  Medication Sig   aspirin (ASPIR-81) 81 MG EC tablet Take 1 tablet (81 mg total) by mouth daily.   atorvastatin (LIPITOR) 20 MG tablet TAKE 1 TABLET DAILY   carvedilol (COREG) 25 MG tablet TAKE 1 TABLET TWICE A DAY WITH MEALS   dapagliflozin propanediol (FARXIGA) 10 MG TABS tablet Take 1 tablet (10 mg total) by mouth daily before breakfast.   icosapent Ethyl (VASCEPA) 1 g capsule TAKE 2 CAPSULES TWICE A DAY   isosorbide-hydrALAZINE (BIDIL) 20-37.5 MG tablet Take 1 tablet by mouth 3 (three) times daily.   sacubitril-valsartan (ENTRESTO) 97-103 MG Take 1 tablet by mouth 2 (two)  times daily.   spironolactone (ALDACTONE) 25 MG tablet TAKE 1 TABLET DAILY    Allergies:  Allergies  Allergen Reactions   Penicillins Rash    Has patient had a PCN reaction causing immediate rash, facial/tongue/throat swelling, SOB or lightheadedness with hypotension:UNSURE Has patient had a PCN reaction causing severe rash involving mucus membranes or skin necrosis:UNSURE Has patient had a PCN reaction that required hospitalization:YES Has patient had a PCN reaction occurring within the last 10 years:NO If all of the above answers are "NO", then may proceed with Cephalosporin use.    Social History   Socioeconomic History   Marital status: Married    Spouse name: Not on file   Number of children:  2   Years of education: Not on file   Highest education level: Not on file  Occupational History    Employer: [employer]  Tobacco Use   Smoking status: Former    Types: Cigarettes   Smokeless tobacco: Never   Tobacco comments:    smoked 1/2 ppd for over 30 years, now down to 3-4 cigs/day  Substance and Sexual Activity   Alcohol use: Yes    Comment: usu liquor three times a week (3-4 drinks at a time)    Drug use: Not Currently   Sexual activity: Not on file  Other Topics Concern   Not on file  Social History Narrative   Works Office manager at [employer].    Social Determinants of Health   Financial Resource Strain: Not on file  Food Insecurity: Not on file  Transportation Needs: Not on file  Physical Activity: Not on file  Stress: Not on file  Social Connections: Not on file  Intimate Partner Violence: Not on file     Family History  Adopted: Yes  Family history unknown: Yes     ROS:  Please see the history of present illness.     All other systems reviewed and negative.    Physical Exam: Blood pressure 122/78, pulse 72, height 5\' 8"  (1.727 m), weight 160 lb (72.6 kg), SpO2 99 %. General: Well developed, well nourished male in no acute distress. Head: Normocephalic, atraumatic, sclera non-icteric, no xanthomas, nares are without discharge. EENT: normal  Lymph Nodes:  none Neck: Negative for carotid bruits. JVD not elevated. Back:without scoliosis kyphosis Lungs: Clear bilaterally to auscultation without wheezes, rales, or rhonchi. Breathing is unlabored. Heart: RRR with S1 S2. No murmur . No rubs, or gallops appreciated. Abdomen: Soft, non-tender, non-distended with normoactive bowel sounds. No hepatomegaly. No rebound/guarding. No obvious abdominal masses. Msk:  Strength and tone appear normal for age. Extremities: No clubbing or cyanosis. No  edema.  Distal pedal pulses are 2+ and equal bilaterally. Skin: Warm and Dry Neuro: Alert and oriented X 3. CN  III-XII intact Grossly normal sensory and motor function . Psych:  Responds to questions appropriately with a normal affect.      Labs: Cardiac Enzymes No results for input(s): CKTOTAL, CKMB, TROPONINI in the last 72 hours. CBC Lab Results  Component Value Date   WBC 5.7 09/01/2019   HGB 12.8 (L) 09/01/2019   HCT 38.8 (L) 09/01/2019   MCV 83.6 09/01/2019   PLT 227 09/01/2019   PROTIME: No results for input(s): LABPROT, INR in the last 72 hours. Chemistry No results for input(s): NA, K, CL, CO2, BUN, CREATININE, CALCIUM, PROT, BILITOT, ALKPHOS, ALT, AST, GLUCOSE in the last 168 hours.  Invalid input(s): LABALBU Lipids Lab Results  Component Value Date   CHOL 175  12/23/2020   HDL 67 12/23/2020   LDLCALC 65 12/23/2020   TRIG 214 (H) 12/23/2020   BNP Pro B Natriuretic peptide (BNP)  Date/Time Value Ref Range Status  09/30/2010 03:31 PM 69.9 0.0 - 100.0 pg/mL Final  03/16/2010 09:28 AM 81.1 0.0 - 100.0 pg/mL Final  12/31/2009 08:40 AM 212.0 (H) 0.0 - 100.0 pg/mL Final  11/17/2009 04:57 AM 976.0 (H) 0.0 - 100.0 pg/mL Final   Thyroid Function Tests: No results for input(s): TSH, T4TOTAL, T3FREE, THYROIDAB in the last 72 hours.  Invalid input(s): FREET3 Miscellaneous No results found for: DDIMER  Radiology/Studies:  No results found.  EKG: sinus @ 72 15/16/44 IVCD LBB-like monophasic fragmented QRS V6 RSR prime lead 1/L   Assessment and Plan:  Nonischemic cardiomyopathy  Left bundle branch block-atypical  Congestive heart failure chronic systolic class IIb  Hypertension  The patient has persistent nonischemic cardiomyopathy despite guideline directed medical therapy.  Moreover, he has a left bundle branch block although atypical with fragmentation in V6 but in RSR prime morphology in leads I, L.  This is been variably scripted over the last couple of years so I suspect that he has sufficient left bundle branch block to justify resynchronization.  He has modest  congestive symptoms.  CRT-D hence is appropriately considered.  We have reviewed the potential risks as well as benefits including but not limited to a reduction of risk of sudden death, and procedural complications including but not limited to perforation, lead dislodgment, hematoma, infection and inappropriate shocks.  He would like to consider this with his wife.  He will talk about it further with Dr. Shirlee Latch and will get back to [place] after his appointment next month.  Sherryl Manges

## 2021-05-22 ENCOUNTER — Other Ambulatory Visit: Payer: Self-pay

## 2021-05-22 ENCOUNTER — Ambulatory Visit (HOSPITAL_COMMUNITY)
Admission: RE | Admit: 2021-05-22 | Discharge: 2021-05-22 | Disposition: A | Discharge status: Home / Self Care | Payer: BC Managed Care – PPO | Source: Ambulatory Visit | Attending: Cardiology | Admitting: Cardiology

## 2021-05-22 ENCOUNTER — Encounter (HOSPITAL_COMMUNITY): Payer: Self-pay | Admitting: Cardiology

## 2021-05-22 VITALS — BP 128/70 | HR 79 | Wt 159.8 lb

## 2021-05-22 DIAGNOSIS — I447 Left bundle-branch block, unspecified: Secondary | ICD-10-CM | POA: Insufficient documentation

## 2021-05-22 DIAGNOSIS — Z79899 Other long term (current) drug therapy: Secondary | ICD-10-CM | POA: Insufficient documentation

## 2021-05-22 DIAGNOSIS — I110 Hypertensive heart disease with heart failure: Secondary | ICD-10-CM | POA: Insufficient documentation

## 2021-05-22 DIAGNOSIS — Z09 Encounter for follow-up examination after completed treatment for conditions other than malignant neoplasm: Secondary | ICD-10-CM | POA: Insufficient documentation

## 2021-05-22 DIAGNOSIS — Z87891 Personal history of nicotine dependence: Secondary | ICD-10-CM | POA: Diagnosis not present

## 2021-05-22 DIAGNOSIS — I2510 Atherosclerotic heart disease of native coronary artery without angina pectoris: Secondary | ICD-10-CM | POA: Diagnosis not present

## 2021-05-22 DIAGNOSIS — I428 Other cardiomyopathies: Secondary | ICD-10-CM | POA: Diagnosis not present

## 2021-05-22 DIAGNOSIS — R0602 Shortness of breath: Secondary | ICD-10-CM | POA: Diagnosis not present

## 2021-05-22 DIAGNOSIS — E785 Hyperlipidemia, unspecified: Secondary | ICD-10-CM | POA: Insufficient documentation

## 2021-05-22 DIAGNOSIS — Z7984 Long term (current) use of oral hypoglycemic drugs: Secondary | ICD-10-CM | POA: Diagnosis not present

## 2021-05-22 DIAGNOSIS — Z7901 Long term (current) use of anticoagulants: Secondary | ICD-10-CM | POA: Diagnosis not present

## 2021-05-22 DIAGNOSIS — Z7982 Long term (current) use of aspirin: Secondary | ICD-10-CM | POA: Diagnosis not present

## 2021-05-22 DIAGNOSIS — I5022 Chronic systolic (congestive) heart failure: Secondary | ICD-10-CM

## 2021-05-22 LAB — BASIC METABOLIC PANEL
Anion gap: 5 (ref 5–15)
BUN: 11 mg/dL (ref 6–20)
CO2: 30 mmol/L (ref 22–32)
Calcium: 9.5 mg/dL (ref 8.9–10.3)
Chloride: 105 mmol/L (ref 98–111)
Creatinine, Ser: 1 mg/dL (ref 0.61–1.24)
GFR, Estimated: >60 mL/min (ref >60)
Glucose, Bld: 96 mg/dL (ref 70–99)
Potassium: 4.5 mmol/L (ref 3.5–5.1)
Sodium: 140 mmol/L (ref 135–145)

## 2021-05-22 NOTE — Patient Instructions (Addendum)
Labs done today, we will call you for abnormal results  Your physician recommends that you schedule a follow-up appointment in: 3 months  Do the following things EVERYDAY: Weigh yourself in the morning before breakfast. Write it down and keep it in a log. Take your medicines as prescribed Eat low salt foods--Limit salt (sodium) to 2000 mg per day.  Stay as active as you can everyday Limit all fluids for the day to less than 2 liters  If you have any questions or concerns before your next appointment please send us a message through mychart or call our office at [phone].    TO LEAVE A MESSAGE FOR THE NURSE SELECT OPTION 2, PLEASE LEAVE A MESSAGE INCLUDING: YOUR NAME DATE OF BIRTH CALL BACK NUMBER REASON FOR CALL**this is important as we prioritize the call backs  YOU WILL RECEIVE A CALL BACK THE SAME DAY AS LONG AS YOU CALL BEFORE 4:00 PM  At the [facility], you and your health needs are our priority. As part of our continuing mission to provide you with exceptional heart care, we have created designated Provider Care Teams. These Care Teams include your primary Cardiologist (physician) and Advanced Practice Providers (APPs- Physician Assistants and Nurse Practitioners) who all work together to provide you with the care you need, when you need it.   You may see any of the following providers on your designated Care Team at your next follow up: Dr Daniel Bensimhon Dr Dalton McLean Dr Brandon Winfrey Amy Clegg, NP Brittainy Simmons, PA Jessica Milford,NP Lauren Kemp, PharmD   Please be sure to bring in all your medications bottles to every appointment.    

## 2021-05-24 NOTE — Progress Notes (Signed)
Patient ID: [name], male   DOB: 14-Nov-1960, 60 y.o.   MRN: [id number] PCP: Dr. Nancy Fetter Cardiology: Dr. Aundra Dubin  60 y.o. returns for followup of nonischemic cardiomyopathy.  Patient presented to [facility] in 3/11 after 2-3 weeks of shortness of breath.  He had a flu-like illness with congestion and myalgias and had been treated with clarithromycin as an outpatient.  However, he became progressively short of breath so went into the hospital.  He was noted to have BNP 976 and pulmonary edema on CXR.  Echo showed EF 20-25%.  Left heart cath was done showing mild coronary disease, suggesting that this was likely a nonischemic cardiomyopathy.  Patient was diuresed and begun on cardiac meds.  Cardiac MRI showed EF 20% without significant delayed enhancement, so no evidence for infiltrative disease.  Echo was repeated 9/11, showing some improvement.  Would estimate EF at 40% with diffuse hypokinesis.  Echo in 4/14 showed EF 55% with septal hypokinesis.  5/16 echo also showed EF 50-55% range.   Echo in 7/17 showed EF back down to 30-35%.  He had not been taking his medications regularly (thinks he was missing about 50% of doses).  Cardiolite done in 8/17, showing EF 36% and possible inferior MI.  Repeat cardiac cath in 9/17 after abnormal Cardiolite showed diffuse but nonobstructive CAD.  Repeat echo in 2/18 showed EF back up to 50%.    Echo in 1/20 showed EF 40-45% with normal RV size and systolic function. He was started back on Bidil.  Echo in 4/21 showed EF 40-45%, mild diffuse hypokinesis, normal RV.  Echo in 5/22 showed EF 25-30%, normal RV.  He saw Dr. Caryl Comes to discuss CRT-D given LBBB-like IVCD.   He continues to work as a Presenter, broadcasting.  He will have 3 drinks about twice a week.  He was a heavier drinker in the past.  He has shortness of breath with heavier exertion but can walk on flat ground without problems.  No chest pain.  No orthopnea/PND.     ECG (personally reviewed): NSR, LBBB-like IVCD  with QRS 152 msec  Labs (3/11): UPEP/SPEP negative, ANA negative, urine drug screen negative, HIV negative, TSH normal, LDL 158, HDL 106, TGs 200, BNP 976, ESR 3, K 4, creatinine 1.01 Labs (4/11): BNP 212, K 4.2, creatinine 0.97 Labs (7/11): BNP 81, K 4.1, creatinine 0.9 Labs (10/11): K 4.2, creatinine 0.8, LDL 98, HDL 68 Labs (1/12): K 3.8, creatinine 0.9, BNP 70 Labs (8/12): K 3.8, creatinine 0.9, HDL 90, LDL 48 Labs (3/14): K 3.5, creatinine 0.9 Labs (5/16): LDL 85 Labs (12/16): K 3.9, creatinine 0.86 Labs (7/17): LDL 55, HDL 146, BNP 24, K 3.7, creatinine 0.7 Labs (8/17): K 3.3, creatinine 0.83 Labs (9/17): K 3.9, creatinine 0.85 Labs (10/17): K 3.8, creatinine 1.05 Labs (5/18): K 3.6, creatinine 0.91 Labs (8/18): creatinine 1.12 Labs (12/18): LDL 68 Labs (12/20): BNP 42, K 4, creatinine 1.04, LDL 75, HDL 76 Labs (4/21): K 3.8,creatinine 1.02 Labs (7/21): K 3.9, creatinine 0.86 Labs (4/22): LDL 65 Labs (7/22): K 4.3, creatinine 0.95  Allergies:  1)  ! Pcn  Past Medical History: 1. Nonischemic Cardiomyopathy: LHC (3/11) showed EF 15%, 40% ostial CFX, 40% prox RCA, 40-50% mid RCA.  Echo (3/11): Moderately dilated LV, EF 20-25%, global HK, severe diastolic dysfunction, mild RV dilation and dysfunction, moderate MR. SPEP/UPEP negative, ANA negative, HIV negative, TSH normal, no drug history.  Moderate ETOH use.  Highest suspicion probably viral myocarditis.   Cardiac MRI (  4/11) showed a moderately dilated LV with global systolic dysfunction, EF 70%, normal RV size with moderate systolic dysfunction, small area of nonspecific subepicardial basal inferoseptal (RV insertion site) delayed enhancement.  Repeat echo (9/11): EF 40% with mild to moderate diffuse hypokinesis.  Echo (4/14) with EF 55%, septal hypokinesis, mild MR.  - Echo (5/16): EF 50-55%, moderate MR. - Echo (7/17): EF 30-35%, mild MR.   - LHC (9/17) with nonobstructive CAD.  - Echo (2/18): EF 50%, diffuse hypokinesis, normal  RV size and systolic function.  - Echo (1/20): EF 40-45%, normal RV size and systolic function.  - Echo (4/21): EF 40-45%, normal RV  - Echo (5/22): EF 25-30%, normal RV. 2. Nonobstructive CAD:  - LHC (3/11) showed EF 15%, 40% ostial CFX, 40% prox RCA, 40-50% mid RCA.  - Cardiolite (8/17) with EF 36%, possible inferior MI.  - LHC (9/17) with EF 45%, 50% ostial stenosis small AV LCx, diffuse RCA disease, 40% proximal/50% mid.  3. Smoking 4. Hyperlipidemia  Family History: The patient is adopted so does not know his family history.   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  smoked half pack per day for over 30 years, quit in 3/21.  Alcohol Use - yes, 2-3 drinks about 3-4 times a week in the past, has now cut back. Married, has two children.    ROS: All systems reviewed and negative except as per HPI.   Current Outpatient Medications  Medication Sig Dispense Refill   aspirin (ASPIR-81) 81 MG EC tablet Take 1 tablet (81 mg total) by mouth daily. 90 tablet 3   atorvastatin (LIPITOR) 20 MG tablet TAKE 1 TABLET DAILY 90 tablet 3   carvedilol (COREG) 25 MG tablet TAKE 1 TABLET TWICE A DAY WITH MEALS 180 tablet 3   dapagliflozin propanediol (FARXIGA) 10 MG TABS tablet Take 1 tablet (10 mg total) by mouth daily before breakfast. 30 tablet 3   icosapent Ethyl (VASCEPA) 1 g capsule TAKE 2 CAPSULES TWICE A DAY 336 capsule 0   isosorbide-hydrALAZINE (BIDIL) 20-37.5 MG tablet Take 1 tablet by mouth 3 (three) times daily. 135 tablet 11   sacubitril-valsartan (ENTRESTO) 97-103 MG Take 1 tablet by mouth 2 (two) times daily. 60 tablet 3   spironolactone (ALDACTONE) 25 MG tablet TAKE 1 TABLET DAILY 90 tablet 3   No current facility-administered medications for this encounter.    BP 128/70   Pulse 79   Wt 72.5 kg (159 lb 12.8 oz)   SpO2 100%   BMI 24.30 kg/m  General: NAD Neck: No JVD, no thyromegaly or thyroid nodule.  Lungs: Clear to auscultation  bilaterally with normal respiratory effort. CV: Nondisplaced PMI.  Heart regular S1/S2, no S3/S4, no murmur.  No peripheral edema.  No carotid bruit.  Normal pedal pulses.  Abdomen: Soft, nontender, no hepatosplenomegaly, no distention.  Skin: Intact without lesions or rashes.  Neurologic: Alert and oriented x 3.  Psych: Normal affect. Extremities: No clubbing or cyanosis.  HEENT: Normal.   Assessment/Plan: 1. Chronic systolic CHF: History of nonischemic cardiomyopathy, had recovered to near normal on 2016 echo but EF down to 30-35% on 7/17 echo.  Repeat LHC in 9/17 showed diffuse but nonobstructive CAD.  EF was up to 50% on 2/18 echo, but was down some on 1/20 echo to 40-45% and remained 40-45% on 4/21 echo. In 5/22 despite good compliance with GDMT, echo showed EF 25-30%.  Not volume overloaded, NYHA class II symptoms.  He  has a wide atypical LBBB (152 msec) on today's ECG.  - Continue Coreg and spironolactone, on goal doses.  - Continue Bidil 1 tab tid. He has not tolerated increase in the past due to lightheadedness.  - Continue Entresto 97/103 bid. - Continue Farxiga 10 mg daily.  - BMET today  - He has cut back on ETOH but still drinking moderately, encouraged him to keep ETOH minimal.  - With EF 25-30% and LBBB-like IVCD, I recommended CRT-D placement.  He has seen Dr. Caryl Comes about this.  He is going to discuss with his wife but thinks that he will go forward with CRT-D.  2. HTN: BP controlled.    3. Hyperlipidemia: Lipids ok in 4/22.      4. Smoking: He has now quit.  5. CAD: Diffuse nonobstructive disease on 9/17 cath. Continue ASA 81 and atorvastatin with goal LDL < 70.   Followup 3 months with APP.   Loralie Champagne 05/24/2021

## 2021-06-10 ENCOUNTER — Telehealth: Payer: Self-pay | Admitting: Internal Medicine

## 2021-06-10 NOTE — Telephone Encounter (Signed)
Spoke with pt who would like to sit down with Dr Graciela Husbands again to discuss CRT-D implant recommended by Dr Shirlee Latch.  Pt scheduled 07/06/2021 at 1115am with Dr Graciela Husbands.  Pt verbalizes understanding and agrees with current plan.

## 2021-06-10 NOTE — Telephone Encounter (Signed)
Patient was calling to speak to the nurse in regards to question/concerns for his upcoming procedure.

## 2021-07-06 ENCOUNTER — Encounter: Payer: Self-pay | Admitting: Internal Medicine

## 2021-07-06 ENCOUNTER — Other Ambulatory Visit: Payer: Self-pay

## 2021-07-06 ENCOUNTER — Ambulatory Visit (INDEPENDENT_AMBULATORY_CARE_PROVIDER_SITE_OTHER): Payer: BC Managed Care – PPO | Admitting: Internal Medicine

## 2021-07-06 VITALS — BP 100/70 | HR 63 | Ht 68.0 in | Wt 157.0 lb

## 2021-07-06 DIAGNOSIS — I5022 Chronic systolic (congestive) heart failure: Secondary | ICD-10-CM | POA: Diagnosis not present

## 2021-07-06 NOTE — Progress Notes (Signed)
Patient Care Team: Deatra James, MD as PCP - General (Family Medicine)   HPI  [name] is a 60 y.o. male seen in follow-up for consideration of an ICD/CRT.  Seen originally 8/22 at that point however he wanted to think about it further and discuss it with his wife and to see if there is any interval change in left ventricular function  He was seen in the ED for positive COVID infection. He was recommended for chest x-ray and antiviral medication.   Today, he is doing well. He did experience a palpitation episode about 3 weeks ago associated with lightheadedness. He has had similar episodes of lightheadedness when his blood sugar drops. These episodes occur about once per week. He is able to perform activities like walking and yard work without becoming short of breath. He takes his spironolactone in the morning before breakfast.   The patient denies chest pain, shortness of breath, nocturnal dyspnea, orthopnea or peripheral edema.  There have been no syncope.  Complains of palpitations, lightheadedness.  DATE TEST EF   3/11 Echo   20-25 %   3/11 LHC  Non obstructive CAD  9/17 LHC 45% Non obstructive CAD  4/21 Echo  40-45%   5/22 Echo  25-35%    Date Cr K Hgb  7/22 0.95 4.3 12.8 (12/20)  9/22  1.00 4.5     Records and Results Reviewed   Past Medical History:  Diagnosis Date   CAD (coronary artery disease)    nonobstructive: LHC as above with mild disease   CHF (congestive heart failure) (HCC)    Chronic systolic heart failure (HCC)    History of echocardiogram    Echo 5/16:  EF 50-55%, diff HK, Gr 1 DD, mod MR, LA upper limits normal, mobile atrial septum (cannot r/p PFO)   HLD (hyperlipidemia)    Nonischemic cardiomyopathy (HCC)    LHC 3/11 showed EF 15%, 40% ostial CFX, 40% prox RCA, 40-50% mid RCa. Echo 3/11 moderate dilated LV, EF 20-25% global hk   Personal history of unspecified circulatory disease     Past Surgical History:  Procedure Laterality  Date   CARDIAC CATHETERIZATION     6 years ago   CARDIAC CATHETERIZATION N/A 05/26/2016   Procedure: Left Heart Cath and Coronary Angiography;  Surgeon: Laurey Morale, MD;  Location: [facility] INVASIVE CV LAB;  Service: Cardiovascular;  Laterality: N/A;    Current Meds  Medication Sig   aspirin (ASPIR-81) 81 MG EC tablet Take 1 tablet (81 mg total) by mouth daily.   atorvastatin (LIPITOR) 20 MG tablet TAKE 1 TABLET DAILY   carvedilol (COREG) 25 MG tablet TAKE 1 TABLET TWICE A DAY WITH MEALS   dapagliflozin propanediol (FARXIGA) 10 MG TABS tablet Take 1 tablet (10 mg total) by mouth daily before breakfast.   icosapent Ethyl (VASCEPA) 1 g capsule TAKE 2 CAPSULES TWICE A DAY   isosorbide-hydrALAZINE (BIDIL) 20-37.5 MG tablet Take 1 tablet by mouth 3 (three) times daily.   sacubitril-valsartan (ENTRESTO) 97-103 MG Take 1 tablet by mouth 2 (two) times daily.   spironolactone (ALDACTONE) 25 MG tablet TAKE 1 TABLET DAILY    Allergies  Allergen Reactions   Penicillins Rash    Has patient had a PCN reaction causing immediate rash, facial/tongue/throat swelling, SOB or lightheadedness with hypotension:UNSURE Has patient had a PCN reaction causing severe rash involving mucus membranes or skin necrosis:UNSURE Has patient had a PCN reaction that required hospitalization:YES Has patient had  a PCN reaction occurring within the last 10 years:NO If all of the above answers are "NO", then may proceed with Cephalosporin use.      Review of Systems negative except from HPI and PMH  Physical Exam BP 100/70   Pulse 63   Ht 5\' 8"  (1.727 m)   Wt 157 lb (71.2 kg)   SpO2 98%   BMI 23.87 kg/m  Well developed and nourished in no acute distress HENT normal Neck supple with JVP-  flat   Clear Regular rate and rhythm, no murmurs or gallops Abd-soft with active BS No Clubbing cyanosis edema Skin-warm and dry A & Oriented  Grossly normal sensory and motor function    ECG sinus at 63 Intervals  13/16/44 Atypical left bundle monophasic upright lead V6 with fractionation  ECG 4/21 LBBB  QRSd 154 monophasic QRS I,L and rS V6 ECG 4/22 IVCD QRSd 142 msec Monophasic upright QRS V6 but rS I,L ECG 6/22  IVCD  LBB like with monophasic fragemented upright QRS V6 but rSr' in I and L    Assessment and Plan:  Nonischemic cardiomyopathy  Left bundle branch block-atypical  Congestive heart failure chronic systolic class IIb  Hypertension  Lightheadedness  [name] is doing exceedingly well on guideline directed therapy.  Having some fatigue and lightheadedness which I wonder may be related to low blood pressure.  But for now we will continue on his Entresto 97 103, Aldactone 25 which we will have him take at night, carvedilol 25 twice daily and BiDil 20/37.53 times a day.  His functional status is hard to assess; he and his wife both stated he is without limitation, and the question is whether he has restricted activity to accommodate his limitations.  We will undertake cardiopulmonary stress testing.  If there is a heart failure limitation, he had his wife agreed to proceed with CRT-D implantation and we have reviewed the potential benefits and risks of this including but not limited to death perforation infection lead dislodgment and inappropriate shocks.  He understand these risks and would be willing to proceed.  I,Mykaella Javier,acting as a scribe for 04-22-1971, MD.,have documented all relevant documentation on the behalf of Sherryl Manges, MD,as directed by  Sherryl Manges, MD while in the presence of Sherryl Manges, MD.  I, Sherryl Manges, MD, have reviewed all documentation for this visit. The documentation on 07/06/21 for the exam, diagnosis, procedures, and orders are all accurate and complete.    07/08/21

## 2021-07-06 NOTE — Patient Instructions (Signed)
Medication Instructions:  Your physician recommends that you continue on your current medications as directed. Please refer to the Current Medication list given to you today.  *If you need a refill on your cardiac medications before your next appointment, please call your pharmacy*   Lab Work: None ordered.  If you have labs (blood work) drawn today and your tests are completely normal, you will receive your results only by: MyChart Message (if you have MyChart) OR A paper copy in the mail If you have any lab test that is abnormal or we need to change your treatment, we will call you to review the results.   Testing/Procedures:  Your physician has recommended that you have a cardiopulmonary stress test (CPX). CPX testing is a non-invasive measurement of heart and lung function. It replaces a traditional treadmill stress test. This type of test provides a tremendous amount of information that relates not only to your present condition but also for future outcomes. This test combines measurements of you ventilation, respiratory gas exchange in the lungs, electrocardiogram (EKG), blood pressure and physical response before, during, and following an exercise protocol.    Follow-Up: At [facility], you and your health needs are our priority.  As part of our continuing mission to provide you with exceptional heart care, we have created designated Provider Care Teams.  These Care Teams include your primary Cardiologist (physician) and Advanced Practice Providers (APPs -  Physician Assistants and Nurse Practitioners) who all work together to provide you with the care you need, when you need it.  We recommend signing up for the patient portal called "MyChart".  Sign up information is provided on this After Visit Summary.  MyChart is used to connect with patients for Virtual Visits (Telemedicine).  Patients are able to view lab/test results, encounter notes, upcoming appointments, etc.  Non-urgent  messages can be sent to your provider as well.   To learn more about what you can do with MyChart, go to [email or web address].    Your next appointment:   To be determined after CPX

## 2021-07-09 ENCOUNTER — Other Ambulatory Visit (HOSPITAL_COMMUNITY): Payer: Self-pay | Admitting: Cardiology

## 2021-07-13 ENCOUNTER — Other Ambulatory Visit: Payer: Self-pay

## 2021-07-13 ENCOUNTER — Ambulatory Visit (HOSPITAL_COMMUNITY): Payer: BC Managed Care – PPO

## 2021-07-13 DIAGNOSIS — I5022 Chronic systolic (congestive) heart failure: Secondary | ICD-10-CM

## 2021-07-14 ENCOUNTER — Telehealth: Payer: Self-pay

## 2021-07-14 DIAGNOSIS — I5022 Chronic systolic (congestive) heart failure: Secondary | ICD-10-CM

## 2021-07-14 DIAGNOSIS — Z01812 Encounter for preprocedural laboratory examination: Secondary | ICD-10-CM

## 2021-07-14 NOTE — Telephone Encounter (Signed)
Spoke with pt and advised per Dr Graciela Husbands CPX is abnormal with moderate limitations.  Dr Graciela Husbands does recommend pt proceed with CRT-D.  Provided available EP lab dates available in December.  Pt verbalizes understanding and states he will discuss with his wife and contact RN once he decides how and when he would like to move forward.

## 2021-07-14 NOTE — Telephone Encounter (Signed)
-----   Message from Paul Salvia, MD sent at 07/13/2021  7:49 PM EST ----- Please Inform Patient that -CPX   is abnormal and supports the idea taht he has limitied his activity to adjust to his limitations-- they describe a moderate limitation   hence would recommend that we proceed with CRT-D    Thanks

## 2021-07-15 NOTE — Telephone Encounter (Signed)
Spoke with the pt and advised him that I will forward to Dr. Ambrose Pancoast re: his CRT-D he was interested to proceed on 09/02/21...will forward to her for further instructions.

## 2021-07-15 NOTE — Telephone Encounter (Signed)
Patient states he is calling to set up appt for CRT.

## 2021-07-16 NOTE — Addendum Note (Signed)
Addended by: Alois Cliche on: 07/16/2021 03:23 PM   Modules accepted: Orders

## 2021-07-16 NOTE — Telephone Encounter (Signed)
Spoke with pt and confirmed he would like to proceed with CRT-D on 09/02/2021.  Advised pt will contact once surgery is scheduled to review instructions.  Pt verbalizes understanding and agrees with current plan.

## 2021-07-16 NOTE — Telephone Encounter (Signed)
Spoke with pt and reveiwed device instruction letter.  CRT-D implant scheduled for 09/02/2021 with Dr Graciela Husbands.  See letter for complete details.  Pt verbalizes understanding and agrees with current plan.

## 2021-08-11 ENCOUNTER — Telehealth: Payer: Self-pay

## 2021-08-11 NOTE — Telephone Encounter (Signed)
Attempted phone call to pt to advise of surgical schedule change for device implant on 09/02/2021.  Pt will now need to arrive at [facility] at 930am.  Left voicemail message to contact RN at [phone].

## 2021-08-14 ENCOUNTER — Telehealth: Payer: Self-pay | Admitting: Internal Medicine

## 2021-08-14 NOTE — Telephone Encounter (Signed)
Spoke with pt who wishes to confirm his lab appointment.  Pt advised he is scheduled for 08/26/2021 and may come anytime between 745am and 430pm.  Pt verbalizes understanding and agrees with current plan.

## 2021-08-14 NOTE — Telephone Encounter (Signed)
Pt is wanting to speak w/ Dr. Odessa Fleming nurse... please follow up in reference to his appt on the 20th

## 2021-08-17 ENCOUNTER — Other Ambulatory Visit (HOSPITAL_COMMUNITY): Payer: Self-pay | Admitting: Cardiology

## 2021-08-21 ENCOUNTER — Telehealth: Payer: Self-pay | Admitting: Internal Medicine

## 2021-08-21 NOTE — Telephone Encounter (Signed)
Patient states that he was returning a call. But no notes are on his chart

## 2021-08-21 NOTE — Telephone Encounter (Signed)
Spoke with pt and advised RN did not attempt call to him today.  Call may have been a reminder for 08/26/2021 appointment for labs.  Pt advised if important call they will call back.  Pt verbalized understanding and thanked Charity fundraiser for the phone call.

## 2021-08-24 ENCOUNTER — Encounter (HOSPITAL_COMMUNITY): Payer: BC Managed Care – PPO | Admitting: Cardiology

## 2021-08-26 ENCOUNTER — Other Ambulatory Visit: Payer: Self-pay

## 2021-08-26 ENCOUNTER — Other Ambulatory Visit: Payer: BC Managed Care – PPO

## 2021-08-26 DIAGNOSIS — I5022 Chronic systolic (congestive) heart failure: Secondary | ICD-10-CM

## 2021-08-26 DIAGNOSIS — Z01812 Encounter for preprocedural laboratory examination: Secondary | ICD-10-CM

## 2021-08-27 LAB — BASIC METABOLIC PANEL
BUN/Creatinine Ratio: 12 (ref 10–24)
BUN: 11 mg/dL (ref 8–27)
CO2: 26 mmol/L (ref 20–29)
Calcium: 9.2 mg/dL (ref 8.6–10.2)
Chloride: 99 mmol/L (ref 96–106)
Creatinine, Ser: 0.91 mg/dL (ref 0.76–1.27)
Glucose: 94 mg/dL (ref 70–99)
Potassium: 4.3 mmol/L (ref 3.5–5.2)
Sodium: 138 mmol/L (ref 134–144)
eGFR: 96 mL/min/{1.73_m2} (ref >59)

## 2021-08-27 LAB — CBC
Hematocrit: 35.3 % — ABNORMAL LOW (ref 37.5–51.0)
Hemoglobin: 12 g/dL — ABNORMAL LOW (ref 13.0–17.7)
MCH: 28.7 pg (ref 26.6–33.0)
MCHC: 34 g/dL (ref 31.5–35.7)
MCV: 84 fL (ref 79–97)
Platelets: 206 10*3/uL (ref 150–450)
RBC: 4.18 x10E6/uL (ref 4.14–5.80)
RDW: 15.5 % — ABNORMAL HIGH (ref 11.6–15.4)
WBC: 6.1 10*3/uL (ref 3.4–10.8)

## 2021-09-01 ENCOUNTER — Telehealth: Payer: Self-pay | Admitting: Internal Medicine

## 2021-09-01 ENCOUNTER — Telehealth: Payer: Self-pay

## 2021-09-01 NOTE — Telephone Encounter (Signed)
Spoke with patient regarding his question on NPO time frame prior to procedure. Pt states that he understands he should not eat or drink anything after midnight the night before his procedure. Pt also advised to take his BP meds (Carvedilol and Entresto) the morning of, prior to 8am, with only sip of water. Pt acknowledges understanding.  Maralyn Sago, RN

## 2021-09-01 NOTE — Telephone Encounter (Signed)
New Message:     Patient would like for Mindi Junker to call him today please. It is about his procedure scheduled for tomorrow.

## 2021-09-01 NOTE — Pre-Procedure Instructions (Signed)
Attempted to call patient.  No answer.

## 2021-09-01 NOTE — Telephone Encounter (Signed)
Patient had follow up question regarding upcoming procedure. He wanted to know what time he needs to start being NPO. Please advise

## 2021-09-01 NOTE — Telephone Encounter (Signed)
Spoke with pt who is asking if he may now eat after midnight tonight since his surgery has been postponed to 2pm 09/02/2021.  Pt advised he may have a light meal at or before 4am.  Pt verbalizes understanding and thanked Char[employer] fundraiser for the call.

## 2021-09-01 NOTE — Telephone Encounter (Signed)
Spoke with pt and advised per hospital schedule pt should now arrive at [facility] at 12 noon.  Pt verbalizes understanding and agrees with current plan.

## 2021-09-02 ENCOUNTER — Ambulatory Visit (HOSPITAL_COMMUNITY): Payer: BC Managed Care – PPO

## 2021-09-02 ENCOUNTER — Ambulatory Visit (HOSPITAL_COMMUNITY)
Admission: RE | Admit: 2021-09-02 | Discharge: 2021-09-02 | Disposition: A | Discharge status: Home / Self Care | Payer: BC Managed Care – PPO | Attending: Internal Medicine | Admitting: Internal Medicine

## 2021-09-02 ENCOUNTER — Ambulatory Visit (HOSPITAL_COMMUNITY)
Admission: RE | Disposition: A | Discharge status: Home / Self Care | Payer: Self-pay | Source: Home / Self Care | Attending: Internal Medicine

## 2021-09-02 ENCOUNTER — Other Ambulatory Visit: Payer: Self-pay

## 2021-09-02 DIAGNOSIS — I5022 Chronic systolic (congestive) heart failure: Secondary | ICD-10-CM | POA: Diagnosis not present

## 2021-09-02 DIAGNOSIS — I110 Hypertensive heart disease with heart failure: Secondary | ICD-10-CM | POA: Diagnosis not present

## 2021-09-02 DIAGNOSIS — I428 Other cardiomyopathies: Secondary | ICD-10-CM | POA: Diagnosis not present

## 2021-09-02 DIAGNOSIS — Z9889 Other specified postprocedural states: Secondary | ICD-10-CM | POA: Diagnosis not present

## 2021-09-02 DIAGNOSIS — Z87891 Personal history of nicotine dependence: Secondary | ICD-10-CM | POA: Diagnosis not present

## 2021-09-02 DIAGNOSIS — Z959 Presence of cardiac and vascular implant and graft, unspecified: Secondary | ICD-10-CM

## 2021-09-02 DIAGNOSIS — I447 Left bundle-branch block, unspecified: Secondary | ICD-10-CM | POA: Insufficient documentation

## 2021-09-02 DIAGNOSIS — Z950 Presence of cardiac pacemaker: Secondary | ICD-10-CM | POA: Diagnosis not present

## 2021-09-02 HISTORY — PX: BIV ICD INSERTION CRT-D: EP1195

## 2021-09-02 SURGERY — BIV ICD INSERTION CRT-D

## 2021-09-02 MED ORDER — SODIUM CHLORIDE 0.9 % IV SOLN
INTRAVENOUS | Status: AC
  Filled 2021-09-02: qty 2

## 2021-09-02 MED ORDER — VANCOMYCIN HCL IN DEXTROSE 1-5 GM/200ML-% IV SOLN
INTRAVENOUS | Status: AC
  Filled 2021-09-02: qty 200

## 2021-09-02 MED ORDER — LIDOCAINE HCL 1 % IJ SOLN
INTRAMUSCULAR | Status: AC
  Filled 2021-09-02: qty 20

## 2021-09-02 MED ORDER — SODIUM CHLORIDE 0.9 % IV SOLN: INTRAVENOUS | Status: DC

## 2021-09-02 MED ORDER — FENTANYL CITRATE (PF) 100 MCG/2ML IJ SOLN
INTRAMUSCULAR | Status: DC | PRN
  Administered 2021-09-02 (×4): 25 ug via INTRAVENOUS
  Administered 2021-09-02: 50 ug via INTRAVENOUS
  Administered 2021-09-02 (×2): 25 ug via INTRAVENOUS

## 2021-09-02 MED ORDER — LIDOCAINE HCL (PF) 1 % IJ SOLN
INTRAMUSCULAR | Status: DC | PRN
  Administered 2021-09-02: 60 mL via SUBCUTANEOUS

## 2021-09-02 MED ORDER — ONDANSETRON HCL 4 MG/2ML IJ SOLN: 4.0000 mg | Freq: Four times a day (QID) | INTRAMUSCULAR | Status: DC | PRN

## 2021-09-02 MED ORDER — FENTANYL CITRATE (PF) 100 MCG/2ML IJ SOLN
INTRAMUSCULAR | Status: AC
  Filled 2021-09-02: qty 2

## 2021-09-02 MED ORDER — MIDAZOLAM HCL 5 MG/5ML IJ SOLN
INTRAMUSCULAR | Status: AC
  Filled 2021-09-02: qty 5

## 2021-09-02 MED ORDER — POVIDONE-IODINE 10 % EX SWAB: 2.0000 "application " | Freq: Once | CUTANEOUS | Status: DC

## 2021-09-02 MED ORDER — ACETAMINOPHEN 325 MG PO TABS
325.0000 mg | ORAL_TABLET | ORAL | Status: DC | PRN
  Filled 2021-09-02: qty 2

## 2021-09-02 MED ORDER — VANCOMYCIN HCL IN DEXTROSE 1-5 GM/200ML-% IV SOLN: 1000.0000 mg | INTRAVENOUS | Status: DC

## 2021-09-02 MED ORDER — SODIUM CHLORIDE 0.9 % IV SOLN: 80.0000 mg | INTRAVENOUS | Status: DC

## 2021-09-02 MED ORDER — VANCOMYCIN HCL 1000 MG IV SOLR
INTRAVENOUS | Status: DC | PRN
  Administered 2021-09-02: 15:00:00 1000 mg via INTRAVENOUS

## 2021-09-02 MED ORDER — CHLORHEXIDINE GLUCONATE 4 % EX LIQD
4.0000 "application " | Freq: Once | CUTANEOUS | Status: DC
  Filled 2021-09-02: qty 60

## 2021-09-02 MED ORDER — IOHEXOL 350 MG/ML SOLN
INTRAVENOUS | Status: DC | PRN
  Administered 2021-09-02 (×2): 32 mL

## 2021-09-02 MED ORDER — CEFAZOLIN SODIUM-DEXTROSE 2-4 GM/100ML-% IV SOLN
INTRAVENOUS | Status: AC
  Filled 2021-09-02: qty 100

## 2021-09-02 MED ORDER — HEPARIN (PORCINE) IN NACL 1000-0.9 UT/500ML-% IV SOLN
INTRAVENOUS | Status: DC | PRN
  Administered 2021-09-02: 500 mL

## 2021-09-02 MED ORDER — MIDAZOLAM HCL 5 MG/5ML IJ SOLN
INTRAMUSCULAR | Status: DC | PRN
  Administered 2021-09-02: 1 mg via INTRAVENOUS
  Administered 2021-09-02: 2 mg via INTRAVENOUS
  Administered 2021-09-02 (×4): 1 mg via INTRAVENOUS

## 2021-09-02 SURGICAL SUPPLY — 19 items
ASSURA CRTD CD3369-40C (ICD Generator) ×3 IMPLANT
BALLN ATTAIN 80 (BALLOONS) ×2
BALLN ATTAIN 80CM 6215 (BALLOONS) ×1
BALLOON ATTAIN 80 (BALLOONS) IMPLANT
CABLE SURGICAL S-101-97-12 (CABLE) ×3 IMPLANT
CATH CPS DIRECT 135 DS2C020 (CATHETERS) ×2 IMPLANT
DEFIB ASSURA CRT-D (ICD Generator) IMPLANT
KIT ESSENTIALS PG (KITS) ×2 IMPLANT
LEAD DURATA 7122-65CM (Lead) ×2 IMPLANT
LEAD QUARTET 1456Q-86 (Lead) IMPLANT
LEAD TENDRIL MRI 52CM LPA1200M (Lead) ×2 IMPLANT
PAD DEFIB RADIO PHYSIO CONN (PAD) ×3 IMPLANT
QUARTET 1456Q-86 (Lead) ×3 IMPLANT
SHEATH 7FR PRELUDE SNAP 13 (SHEATH) ×2 IMPLANT
SHEATH 8FR PRELUDE SNAP 13 (SHEATH) ×2 IMPLANT
SHEATH 9.5FR PRELUDE SNAP 13 (SHEATH) ×2 IMPLANT
TRAY PACEMAKER INSERTION (PACKS) ×3 IMPLANT
WIRE ACUITY WHISPER EDS 4648 (WIRE) ×2 IMPLANT
WIRE HI TORQ VERSACORE-J 145CM (WIRE) ×4 IMPLANT

## 2021-09-02 NOTE — H&P (Signed)
Patient Care Team: Deatra James, MD as PCP - General (Family Medicine)   HPI  [name] is a 60 y.o. male admitted today for ICD CRT implantation for primary prevention in the setting of NICM and CHF with moderate limitations on CPX testing  The patient denies chest pain, shortness of breath, nocturnal dyspnea, orthopnea or peripheral edema.  There have been no palpitations, lightheadedness or syncope.  Marland Kitchen    DATE TEST EF    3/11 Echo   20-25 %    3/11 LHC   Non obstructive CAD  9/17 LHC 45% Non obstructive CAD  4/21 Echo  40-45%    5/22 Echo  25-35%      Date Cr K Hgb  7/22 0.95 4.3 12.8 (12/20)  9/22  1.00 4.5      Records and Results Reviewed   Past Medical History:  Diagnosis Date   CAD (coronary artery disease)    nonobstructive: LHC as above with mild disease   CHF (congestive heart failure) (HCC)    Chronic systolic heart failure (HCC)    History of echocardiogram    Echo 5/16:  EF 50-55%, diff HK, Gr 1 DD, mod MR, LA upper limits normal, mobile atrial septum (cannot r/p PFO)   HLD (hyperlipidemia)    Nonischemic cardiomyopathy (HCC)    LHC 3/11 showed EF 15%, 40% ostial CFX, 40% prox RCA, 40-50% mid RCa. Echo 3/11 moderate dilated LV, EF 20-25% global hk   Personal history of unspecified circulatory disease     Past Surgical History:  Procedure Laterality Date   CARDIAC CATHETERIZATION     6 years ago   CARDIAC CATHETERIZATION N/A 05/26/2016   Procedure: Left Heart Cath and Coronary Angiography;  Surgeon: Laurey Morale, MD;  Location: [facility] INVASIVE CV LAB;  Service: Cardiovascular;  Laterality: N/A;    Current Facility-Administered Medications  Medication Dose Route Frequency Provider Last Rate Last Admin   0.9 %  sodium chloride infusion   Intravenous Continuous Duke Salvia, MD 50 mL/hr at 09/02/21 1319 New Bag at 09/02/21 1319   0.9 %  sodium chloride infusion   Intravenous Continuous Duke Salvia, MD 50 mL/hr at 09/02/21 1320 New Bag  at 09/02/21 1320   chlorhexidine (HIBICLENS) 4 % liquid 4 application  4 application Topical Once Duke Salvia, MD       gentamicin (GARAMYCIN) 80 mg in sodium chloride 0.9 % 500 mL irrigation  80 mg Irrigation On Call Duke Salvia, MD       povidone-iodine 10 % swab 2 application  2 application Topical Once Duke Salvia, MD       vancomycin (VANCOCIN) IVPB 1000 mg/200 mL premix  1,000 mg Intravenous On Call Duke Salvia, MD        Allergies  Allergen Reactions   Penicillins Rash    Has patient had a PCN reaction causing immediate rash, facial/tongue/throat swelling, SOB or lightheadedness with hypotension:UNSURE Has patient had a PCN reaction causing severe rash involving mucus membranes or skin necrosis:UNSURE Has patient had a PCN reaction that required hospitalization:YES Has patient had a PCN reaction occurring within the last 10 years:NO If all of the above answers are "NO", then may proceed with Cephalosporin use.      Social History   Tobacco Use   Smoking status: Former    Types: Cigarettes   Smokeless tobacco: Never   Tobacco comments:    smoked 1/2 ppd for over 30  years, now down to 3-4 cigs/day  Substance Use Topics   Alcohol use: Yes    Comment: usu liquor three times a week (3-4 drinks at a time)    Drug use: Not Currently     Family History  Adopted: Yes  Family history unknown: Yes     Current Meds  Medication Sig   aspirin (ASPIR-81) 81 MG EC tablet Take 1 tablet (81 mg total) by mouth daily.   atorvastatin (LIPITOR) 20 MG tablet TAKE 1 TABLET DAILY   carvedilol (COREG) 25 MG tablet TAKE 1 TABLET TWICE A DAY WITH MEALS   ENTRESTO 97-103 MG TAKE 1 TABLET BY MOUTH TWICE A DAY   FARXIGA 10 MG TABS tablet TAKE 1 TABLET BY MOUTH DAILY BEFORE BREAKFAST.   icosapent Ethyl (VASCEPA) 1 g capsule TAKE 2 CAPSULES TWICE A DAY   isosorbide-hydrALAZINE (BIDIL) 20-37.5 MG tablet Take 1 tablet by mouth 3 (three) times daily.   spironolactone (ALDACTONE) 25  MG tablet TAKE 1 TABLET DAILY     Review of Systems negative except from HPI and PMH  Physical Exam BP 134/81    Pulse 77    Temp 98.2 F (36.8 C) (Oral)    Resp 15    Ht 5\' 8"  (1.727 m)    Wt 73 kg    SpO2 100%    BMI 24.48 kg/m  Well developed and well nourished in no acute distress HENT normal E scleral and icterus clear Neck Supple JVP flat; carotids brisk and full Clear to ausculation Regular rate and rhythm, no murmurs gallops or rub Soft with active bowel sounds No clubbing cyanosis  Edema Alert and oriented, grossly normal motor and sensory function Skin Warm and Dry    Assessment and  Plan Nonischemic cardiomyopathy   Left bundle branch block-atypical   Congestive heart failure chronic systolic class IIb   Hypertension    For CRT -D implantation for NICM and LBBB  Have reviewed the potential benefits and risks of ICD implantation including but not limited to death, perforation of heart or lung, lead dislodgement, infection,  device malfunction and inappropriate shocks.  The patient express understanding  and is willing to proceed.

## 2021-09-02 NOTE — Discharge Instructions (Signed)
After Your ICD (Implantable Cardiac Defibrillator)   You have a [employer] ICD  ACTIVITY Do not lift your arm above shoulder height for 1 week after your procedure. After 7 days, you may progress as below.  You should remove your sling 24 hours after your procedure, unless otherwise instructed by your provider.     Wednesday September 09, 2021  Thursday September 10, 2021 Friday September 11, 2021 Saturday September 12, 2021   Do not lift, push, pull, or carry anything over 10 pounds with the affected arm until 6 weeks (Wednesday October 14, 2021 ) after your procedure.   You may drive AFTER your wound check, unless you have been told otherwise by your provider.   Ask your healthcare provider when you can go back to work   INCISION/Dressing If you are on a blood thinner such as Coumadin, Xarelto, Eliquis, Plavix, or Pradaxa please confirm with your provider when this should be resumed.   If large square, outer bandage is left in place, this can be removed after 24 hours from your procedure. Do not remove steri-strips or glue as below.   Monitor your defibrillator site for redness, swelling, and drainage. Call the device clinic at [phone] if you experience these symptoms or fever/chills.  If your incision is closed with Dermabond/Surgical glue. You may shower 1 day after your pacemaker implant and wash around the site with soap and water.    If you were discharged in a sling, please do not wear this during the day more than 48 hours after your surgery unless otherwise instructed. This may increase the risk of stiffness and soreness in your shoulder.   Avoid lotions, ointments, or perfumes over your incision until it is well-healed.  You may use a hot tub or a pool AFTER your wound check appointment if the incision is completely closed.  Your ICD is designed to protect you from life threatening heart rhythms. Because of this, you may receive a shock.   1 shock with no symptoms:  Call the  office during business hours. 1 shock with symptoms (chest pain, chest pressure, dizziness, lightheadedness, shortness of breath, overall feeling unwell):  Call 911. If you experience 2 or more shocks in 24 hours:  Call 911. If you receive a shock, you should not drive for 6 months per the [place] DMV IF you receive appropriate therapy from your ICD.   ICD Alerts:  Some alerts are vibratory and others beep. These are NOT emergencies. Please call our office to let us know. If this occurs at night or on weekends, it can wait until the next business day. Send a remote transmission.  If your device is capable of reading fluid status (for heart failure), you will be offered monthly monitoring to review this with you.   DEVICE MANAGEMENT Remote monitoring is used to monitor your ICD from home. This monitoring is scheduled every 91 days by our office. It allows [place] to keep an eye on the functioning of your device to ensure it is working properly. You will routinely see your Electrophysiologist annually (more often if necessary).   You should receive your ID card for your new device in 4-8 weeks. Keep this card with you at all times once received. Consider wearing a medical alert bracelet or necklace.  Your ICD  may be MRI compatible. This will be discussed at your next office visit/wound check.  You should avoid contact with strong electric or magnetic fields.   Do not use amateur (  ham) radio equipment or electric (arc) welding torches. MP3 player headphones with magnets should not be used. Some devices are safe to use if held at least 12 inches (30 cm) from your defibrillator. These include power tools, lawn mowers, and speakers. If you are unsure if something is safe to use, ask your health care provider.  When using your cell phone, hold it to the ear that is on the opposite side from the defibrillator. Do not leave your cell phone in a pocket over the defibrillator.  You may safely use electric blankets,  heating pads, computers, and microwave ovens.  Call the office right away if: You have chest pain. You feel more than one shock. You feel more short of breath than you have felt before. You feel more light-headed than you have felt before. Your incision starts to open up.  This information is not intended to replace advice given to you by your health care provider. Make sure you discuss any questions you have with your health care provider.

## 2021-09-03 ENCOUNTER — Telehealth: Payer: Self-pay

## 2021-09-03 ENCOUNTER — Encounter (HOSPITAL_COMMUNITY): Payer: Self-pay | Admitting: Internal Medicine

## 2021-09-03 MED FILL — Gentamicin Sulfate Inj 40 MG/ML: INTRAMUSCULAR | Qty: 80 | Status: AC

## 2021-09-03 NOTE — Telephone Encounter (Signed)
-----   Message from Graciella Freer, PA-C sent at 09/02/2021  5:06 PM EST ----- Same day DC ICD SK 12/28

## 2021-09-03 NOTE — Telephone Encounter (Signed)
Follow-up after same day discharge: Implant date: 09/02/21 MD: Sherryl Manges, MD Device: CRT-D  Location: Left chest   Wound check visit: 09/04/21 9:00 am pressure dsg removal, 09/16/21 12:00  90 day MD follow-up: 12/04/21 3:15  Remote Transmission received:No  Dressing removed: NA  Patient will be assessed tomorrow in device clinic.

## 2021-09-04 ENCOUNTER — Ambulatory Visit: Payer: BC Managed Care – PPO

## 2021-09-04 ENCOUNTER — Telehealth: Payer: Self-pay

## 2021-09-04 ENCOUNTER — Other Ambulatory Visit: Payer: Self-pay

## 2021-09-04 ENCOUNTER — Ambulatory Visit (INDEPENDENT_AMBULATORY_CARE_PROVIDER_SITE_OTHER): Payer: BC Managed Care – PPO | Admitting: Internal Medicine

## 2021-09-04 DIAGNOSIS — Z95810 Presence of automatic (implantable) cardiac defibrillator: Secondary | ICD-10-CM

## 2021-09-04 NOTE — Telephone Encounter (Signed)
Wound check from 09/04/21 assigned to SK as silver nitrate had to be applied by SK.

## 2021-09-04 NOTE — Progress Notes (Signed)
Seen for incisional oozing Silver nitrate applied Antibiotics and Tegaderm applied

## 2021-09-04 NOTE — Telephone Encounter (Signed)
Erroneous encounter

## 2021-09-06 ENCOUNTER — Encounter: Payer: Self-pay | Admitting: Internal Medicine

## 2021-09-06 NOTE — Telephone Encounter (Signed)
° °  Patient called the after hours line with concerns for infection at ICD implantation site. Was able to assist him with mychart set up and he submitted pictures for review. He was seen in the office 09/04/21 for wound check after experiencing oozing. Silver nitrate and antibiotic ointment applied with plans to recheck site 09/08/21. He called today with ongoing concerns for infection. Pictures reviewed and suspect the ointment has shifted under the steri strips causing the old blood to spread. No clear evidence of active infection or bleeding. Site is not warm to touch and redness has not spread. He is not experiencing fever/chills/sweats. Favor ongoing watchful waiting and plans to have wound checked 09/08/21 as scheduled. Wife also notes an issue with his home transmitter device with wifi light flashing suggesting error. Suggested moving transmitter closer to wifi router otherwise reaching out to trouble shooter line when they reopen on Tuesday. He was appreciative of the call and in agreement with the plan.   Beatriz Stallion, PA-C 09/06/21; 11:15 AM

## 2021-09-08 ENCOUNTER — Other Ambulatory Visit: Payer: Self-pay

## 2021-09-08 ENCOUNTER — Encounter: Payer: Self-pay | Admitting: Internal Medicine

## 2021-09-08 ENCOUNTER — Ambulatory Visit (INDEPENDENT_AMBULATORY_CARE_PROVIDER_SITE_OTHER): Payer: BC Managed Care – PPO | Admitting: Internal Medicine

## 2021-09-08 VITALS — BP 120/68 | HR 77 | Ht 68.0 in | Wt 160.8 lb

## 2021-09-08 DIAGNOSIS — Z95810 Presence of automatic (implantable) cardiac defibrillator: Secondary | ICD-10-CM

## 2021-09-08 NOTE — Patient Instructions (Signed)
Medication Instructions:  ?Your physician recommends that you continue on your current medications as directed. Please refer to the Current Medication list given to you today. ? ?*If you need a refill on your cardiac medications before your next appointment, please call your pharmacy* ? ? ?Lab Work: ?None ordered. ? ?If you have labs (blood work) drawn today and your tests are completely normal, you will receive your results only by: ?MyChart Message (if you have MyChart) OR ?A paper copy in the mail ?If you have any lab test that is abnormal or we need to change your treatment, we will call you to review the results. ? ? ?Testing/Procedures: ?None ordered. ? ? ? ?Follow-Up: ?At [facility], you and your health needs are our priority.  As part of our continuing mission to provide you with exceptional heart care, we have created designated Provider Care Teams.  These Care Teams include your primary Cardiologist (physician) and Advanced Practice Providers (APPs -  Physician Assistants and Nurse Practitioners) who all work together to provide you with the care you need, when you need it. ? ?We recommend signing up for the patient portal called "MyChart".  Sign up information is provided on this After Visit Summary.  MyChart is used to connect with patients for Virtual Visits (Telemedicine).  Patients are able to view lab/test results, encounter notes, upcoming appointments, etc.  Non-urgent messages can be sent to your provider as well.   ?To learn more about what you can do with MyChart, go to [email or web address].   ? ?Your next appointment:   ?Follow up as scheduled ?

## 2021-09-08 NOTE — Progress Notes (Signed)
Seen for wound check and removal of bandage for superficial bleeding

## 2021-09-10 ENCOUNTER — Ambulatory Visit: Payer: BC Managed Care – PPO

## 2021-09-10 ENCOUNTER — Other Ambulatory Visit: Payer: Self-pay

## 2021-09-10 ENCOUNTER — Telehealth: Payer: Self-pay

## 2021-09-10 DIAGNOSIS — Z95810 Presence of automatic (implantable) cardiac defibrillator: Secondary | ICD-10-CM

## 2021-09-10 MED ORDER — SULFAMETHOXAZOLE-TRIMETHOPRIM 800-160 MG PO TABS: 1.0000 | ORAL_TABLET | Freq: Two times a day (BID) | ORAL | 0 refills | Status: AC

## 2021-09-10 NOTE — Telephone Encounter (Signed)
The patient states he is leaking at his device site. I told the patient to come in at 4:30 pm for the nurse to take a look at the device site.

## 2021-09-10 NOTE — Patient Instructions (Addendum)
Wash incision site with clean wash cloth and soap 4 times a day and apply clean bandage each time.   Apply warm compresses for 15 minutes 4 times a day after washing incision site.

## 2021-09-10 NOTE — Progress Notes (Signed)
°  °  Wound check due to patient c/o of drainage. See wrap up for SK recommendations.

## 2021-09-16 ENCOUNTER — Ambulatory Visit: Payer: BC Managed Care – PPO

## 2021-09-17 ENCOUNTER — Other Ambulatory Visit: Payer: Self-pay

## 2021-09-17 ENCOUNTER — Ambulatory Visit: Payer: BC Managed Care – PPO

## 2021-09-17 DIAGNOSIS — Z95810 Presence of automatic (implantable) cardiac defibrillator: Secondary | ICD-10-CM

## 2021-09-17 NOTE — Progress Notes (Signed)
° °  ICD wound re-check, site assessed by Dr. Graciela Husbands. See wrap up for instructions and follow up date.

## 2021-09-17 NOTE — Patient Instructions (Signed)
Continue washing incision site 4 times a day with soap and clean wash cloth. Apply warm compress after washing incision site.   Apply new clean dressing everyday or if dressing becomes saturated.  Return to Device Clinic on 09/25/2021 at 4:30 for wound re-check. Have Front desk to call device clinic when you check in for this appointment

## 2021-09-25 ENCOUNTER — Other Ambulatory Visit: Payer: Self-pay

## 2021-09-25 ENCOUNTER — Ambulatory Visit: Payer: BC Managed Care – PPO

## 2021-09-25 DIAGNOSIS — Z95810 Presence of automatic (implantable) cardiac defibrillator: Secondary | ICD-10-CM

## 2021-09-25 NOTE — Progress Notes (Signed)
° °  Wound re-check site assessed by Dr. Elberta Fortis recommended continuing current treatment plan of washing site 4 times a day/ applying warm compresses and applying clean dressing. Patient voiced understanding ROV on 10/02/20 with device clinic while  SK in office.

## 2021-09-28 ENCOUNTER — Other Ambulatory Visit (HOSPITAL_COMMUNITY): Payer: Self-pay | Admitting: Cardiology

## 2021-10-02 ENCOUNTER — Other Ambulatory Visit: Payer: Self-pay

## 2021-10-02 ENCOUNTER — Ambulatory Visit (INDEPENDENT_AMBULATORY_CARE_PROVIDER_SITE_OTHER): Payer: BC Managed Care – PPO

## 2021-10-02 DIAGNOSIS — I5022 Chronic systolic (congestive) heart failure: Secondary | ICD-10-CM

## 2021-10-02 NOTE — Progress Notes (Signed)
Patient in office for wound recheck.  Site observed by Dr. Caryl Comes.  Dr. Caryl Comes advised patient to leave site open to air, scrubbing daily with clean wash cloth.  If any drainage occurs apply bandaid for protection of clothing.  Dr. Caryl Comes would like patient to return in 2 weeks for wound observation.  Scheduled patient for time that works with his job scheduled 4:30pm on Friday, Dr. Caryl Comes will be out of office all week   Patient provided verbal consent for photograph to be entered into chart.

## 2021-10-02 NOTE — Patient Instructions (Addendum)
Wash with clean wash cloth daily.  If any drainage occurs may apply bandage to prevent soiling clothes otherwise leave open to air.  Return in 2 weeks for follow-up in device clinic.  Appt scheduled for 4:30pm

## 2021-10-06 ENCOUNTER — Encounter (HOSPITAL_COMMUNITY): Payer: BC Managed Care – PPO

## 2021-10-16 ENCOUNTER — Other Ambulatory Visit: Payer: Self-pay

## 2021-10-16 ENCOUNTER — Ambulatory Visit (INDEPENDENT_AMBULATORY_CARE_PROVIDER_SITE_OTHER): Payer: BC Managed Care – PPO

## 2021-10-16 DIAGNOSIS — Z95810 Presence of automatic (implantable) cardiac defibrillator: Secondary | ICD-10-CM

## 2021-10-16 NOTE — Progress Notes (Signed)
Patient in clinic for wound recheck.  Patient states that the scab fell off site yesterday.  He has kept a bandaid loosely covering site to make sure there is no drainage.  Site has been dry.    Patient scheduled to return on 2/16 when Dr. Graciela Husbands is in office.    Patient provided verbal consent for site to be photographed and added to chart.

## 2021-10-21 NOTE — Progress Notes (Signed)
Patient ID: [name], male   DOB: [birth year], 61 y.o.   MRN: [id number] PCP: Dr. Nancy Fetter Cardiology: Dr. Aundra Dubin EP : Dr Caryl Comes   61 y.o. returns for followup of nonischemic cardiomyopathy.  Patient presented to [facility] in 3/11 after 2-3 weeks of shortness of breath.  He had a flu-like illness with congestion and myalgias and had been treated with clarithromycin as an outpatient.  However, he became progressively short of breath so went into the hospital.  He was noted to have BNP 976 and pulmonary edema on CXR.  Echo showed EF 20-25%.  Left heart cath was done showing mild coronary disease, suggesting that this was likely a nonischemic cardiomyopathy.  Patient was diuresed and begun on cardiac meds.  Cardiac MRI showed EF 20% without significant delayed enhancement, so no evidence for infiltrative disease.  Echo was repeated 9/11, showing some improvement.  Would estimate EF at 40% with diffuse hypokinesis.  Echo in 4/14 showed EF 55% with septal hypokinesis.  5/16 echo also showed EF 50-55% range.   Echo in 7/17 showed EF back down to 30-35%.  He had not been taking his medications regularly (thinks he was missing about 50% of doses).  Cardiolite done in 8/17, showing EF 36% and possible inferior MI.  Repeat cardiac cath in 9/17 after abnormal Cardiolite showed diffuse but nonobstructive CAD.  Repeat echo in 2/18 showed EF back up to 50%.    Echo in 1/20 showed EF 40-45% with normal RV size and systolic function. He was started back on Bidil.  Echo in 4/21 showed EF 40-45%, mild diffuse hypokinesis, normal RV.  Echo in 5/22 showed EF 25-30%, normal RV.  He saw Dr. Caryl Comes to discuss CRT-D given LBBB-like IVCD.   S/P BiV 08/2021   Overall feeling fine. Denies SOB/PND/Orthopnea. Appetite ok. No fever or chills. Weight at home has been stable. Taking all medications. Works full time as a [employer].   Labs (3/11): UPEP/SPEP negative, ANA negative, urine drug screen negative, HIV negative, TSH normal,  LDL 158, HDL 106, TGs 200, BNP 976, ESR 3, K 4, creatinine 1.01 Labs (4/11): BNP 212, K 4.2, creatinine 0.97 Labs (7/11): BNP 81, K 4.1, creatinine 0.9 Labs (10/11): K 4.2, creatinine 0.8, LDL 98, HDL 68 Labs (1/12): K 3.8, creatinine 0.9, BNP 70 Labs (8/12): K 3.8, creatinine 0.9, HDL 90, LDL 48 Labs (3/14): K 3.5, creatinine 0.9 Labs (5/16): LDL 85 Labs (12/16): K 3.9, creatinine 0.86 Labs (7/17): LDL 55, HDL 146, BNP 24, K 3.7, creatinine 0.7 Labs (8/17): K 3.3, creatinine 0.83 Labs (9/17): K 3.9, creatinine 0.85 Labs (10/17): K 3.8, creatinine 1.05 Labs (5/18): K 3.6, creatinine 0.91 Labs (8/18): creatinine 1.12 Labs (12/18): LDL 68 Labs (12/20): BNP 42, K 4, creatinine 1.04, LDL 75, HDL 76 Labs (4/21): K 3.8,creatinine 1.02 Labs (7/21): K 3.9, creatinine 0.86 Labs (4/22): LDL 65 Labs (7/22): K 4.3, creatinine 0.95 Labs (08/2021) K 4.3 Creatinine 0.9   Allergies:  1)  ! Pcn  Past Medical History: 1. Nonischemic Cardiomyopathy: LHC (3/11) showed EF 15%, 40% ostial CFX, 40% prox RCA, 40-50% mid RCA.  Echo (3/11): Moderately dilated LV, EF 20-25%, global HK, severe diastolic dysfunction, mild RV dilation and dysfunction, moderate MR. SPEP/UPEP negative, ANA negative, HIV negative, TSH normal, no drug history.  Moderate ETOH use.  Highest suspicion probably viral myocarditis.   Cardiac MRI (4/11) showed a moderately dilated LV with global systolic dysfunction, EF 97%, normal RV size with moderate systolic dysfunction, small area  of nonspecific subepicardial basal inferoseptal (RV insertion site) delayed enhancement.  Repeat echo (9/11): EF 40% with mild to moderate diffuse hypokinesis.  Echo (4/14) with EF 55%, septal hypokinesis, mild MR.  - Echo (5/16): EF 50-55%, moderate MR. - Echo (7/17): EF 30-35%, mild MR.   - LHC (9/17) with nonobstructive CAD.  - Echo (2/18): EF 50%, diffuse hypokinesis, normal RV size and systolic function.  - Echo (1/20): EF 40-45%, normal RV size and  systolic function.  - Echo (4/21): EF 40-45%, normal RV  - Echo (5/22): EF 25-30%, normal RV. 09/02/21 S/P CRT-D implant  2. Nonobstructive CAD:  - LHC (3/11) showed EF 15%, 40% ostial CFX, 40% prox RCA, 40-50% mid RCA.  - Cardiolite (8/17) with EF 36%, possible inferior MI.  - LHC (9/17) with EF 45%, 50% ostial stenosis small AV LCx, diffuse RCA disease, 40% proximal/50% mid.  3. Smoking 4. Hyperlipidemia  Family History: The patient is adopted so does not know his family history.   Social History: Security guard full time  Tobacco Use - No  smoked half pack per day for over 30 years, quit in 3/21.  Alcohol Use - yes, 2-3 drinks about 3-4 times a week in the past, has now cut back. Married, has two children.    ROS: All systems reviewed and negative except as per HPI.   Current Outpatient Medications  Medication Sig Dispense Refill   aspirin (ASPIR-81) 81 MG EC tablet Take 1 tablet (81 mg total) by mouth daily. 90 tablet 3   atorvastatin (LIPITOR) 20 MG tablet TAKE 1 TABLET DAILY 90 tablet 3   carvedilol (COREG) 25 MG tablet TAKE 1 TABLET TWICE A DAY WITH MEALS 180 tablet 3   ENTRESTO 97-103 MG TAKE 1 TABLET BY MOUTH TWICE A DAY 60 tablet 3   FARXIGA 10 MG TABS tablet TAKE 1 TABLET BY MOUTH DAILY BEFORE BREAKFAST. 90 tablet 3   icosapent Ethyl (VASCEPA) 1 g capsule TAKE 2 CAPSULES TWICE A DAY 336 capsule 3   isosorbide-hydrALAZINE (BIDIL) 20-37.5 MG tablet Take 1 tablet by mouth 3 (three) times daily. 135 tablet 11   spironolactone (ALDACTONE) 25 MG tablet TAKE 1 TABLET DAILY 90 tablet 3   No current facility-administered medications for this encounter.    BP 110/70    Pulse 80    Wt 72.1 kg (159 lb)    SpO2 100%    BMI 24.18 kg/m  Wt Readings from Last 3 Encounters:  10/22/21 72.1 kg (159 lb)  09/08/21 72.9 kg (160 lb 12.8 oz)  09/02/21 73 kg (161 lb)    General:  Well appearing. No resp difficulty HEENT: normal Neck: supple. no JVD. Carotids 2+ bilat; no bruits. No  lymphadenopathy or thryomegaly appreciated. Cor: PMI nondisplaced. Regular rate & rhythm. No rubs, gallops or murmurs. Lungs: clear Abdomen: soft, nontender, nondistended. No hepatosplenomegaly. No bruits or masses. Good bowel sounds. Extremities: no cyanosis, clubbing, rash, edema Neuro: alert & orientedx3, cranial nerves grossly intact. moves all 4 extremities w/o difficulty. Affect pleasant  Assessment/Plan: 1. Chronic systolic CHF: History of nonischemic cardiomyopathy, had recovered to near normal on 2016 echo but EF down to 30-35% on 7/17 echo.  Repeat LHC in 9/17 showed diffuse but nonobstructive CAD.  EF was up to 50% on 2/18 echo, but was down some on 1/20 echo to 40-45% and remained 40-45% on 4/21 echo. In 5/22 despite good compliance with GDMT, echo showed EF 25-30%.  S/P CRT-D implant12/288/22 -NYHA I. Volume status stable.  -  Continue Coreg and spironolactone, on goal doses.  - Continue Bidil 1 tab tid. He has not tolerated increase in the past due to lightheadedness.  - Continue Entresto 97/103 bid. - Continue Farxiga 10 mg daily.  - Check BMET  - Plan to repeat ECHO in 3 months.  2. HTN: BP controlled.    3. Hyperlipidemia: Lipids ok in 4/22.      4. Smoking: He has now quit.  5. CAD: Diffuse nonobstructive disease on 9/17 cath. Continue ASA 81 and atorvastatin with goal LDL < 70.  No chest pain.    Follow up in 3 months with Dr Aundra Dubin. And an ECHO.     Lisaanne Lawrie NP-C  10/22/2021

## 2021-10-22 ENCOUNTER — Ambulatory Visit (HOSPITAL_COMMUNITY)
Admission: RE | Admit: 2021-10-22 | Discharge: 2021-10-22 | Disposition: A | Discharge status: Home / Self Care | Payer: BC Managed Care – PPO | Source: Ambulatory Visit | Attending: Adult Health | Admitting: Adult Health

## 2021-10-22 ENCOUNTER — Ambulatory Visit (INDEPENDENT_AMBULATORY_CARE_PROVIDER_SITE_OTHER): Payer: BC Managed Care – PPO

## 2021-10-22 ENCOUNTER — Other Ambulatory Visit: Payer: Self-pay

## 2021-10-22 VITALS — BP 110/70 | HR 80 | Wt 159.0 lb

## 2021-10-22 DIAGNOSIS — I2510 Atherosclerotic heart disease of native coronary artery without angina pectoris: Secondary | ICD-10-CM

## 2021-10-22 DIAGNOSIS — Z79899 Other long term (current) drug therapy: Secondary | ICD-10-CM | POA: Diagnosis not present

## 2021-10-22 DIAGNOSIS — I5022 Chronic systolic (congestive) heart failure: Secondary | ICD-10-CM | POA: Diagnosis not present

## 2021-10-22 DIAGNOSIS — Z95810 Presence of automatic (implantable) cardiac defibrillator: Secondary | ICD-10-CM | POA: Insufficient documentation

## 2021-10-22 DIAGNOSIS — E785 Hyperlipidemia, unspecified: Secondary | ICD-10-CM | POA: Diagnosis not present

## 2021-10-22 DIAGNOSIS — Z87891 Personal history of nicotine dependence: Secondary | ICD-10-CM | POA: Insufficient documentation

## 2021-10-22 DIAGNOSIS — Z7982 Long term (current) use of aspirin: Secondary | ICD-10-CM | POA: Insufficient documentation

## 2021-10-22 DIAGNOSIS — I110 Hypertensive heart disease with heart failure: Secondary | ICD-10-CM | POA: Diagnosis not present

## 2021-10-22 DIAGNOSIS — Z7984 Long term (current) use of oral hypoglycemic drugs: Secondary | ICD-10-CM | POA: Diagnosis not present

## 2021-10-22 DIAGNOSIS — I428 Other cardiomyopathies: Secondary | ICD-10-CM | POA: Diagnosis not present

## 2021-10-22 LAB — BASIC METABOLIC PANEL
Anion gap: 8 (ref 5–15)
BUN: 12 mg/dL (ref 6–20)
CO2: 26 mmol/L (ref 22–32)
Calcium: 8.7 mg/dL — ABNORMAL LOW (ref 8.9–10.3)
Chloride: 108 mmol/L (ref 98–111)
Creatinine, Ser: 0.88 mg/dL (ref 0.61–1.24)
GFR, Estimated: >60 mL/min (ref >60)
Glucose, Bld: 110 mg/dL — ABNORMAL HIGH (ref 70–99)
Potassium: 4.1 mmol/L (ref 3.5–5.1)
Sodium: 142 mmol/L (ref 135–145)

## 2021-10-22 NOTE — Progress Notes (Signed)
In-clinic wound check. Wound completely healed with approximated edges. No redness, drainage, or tendernous noted. No suture abscesses present. Patient is instructed he may return to his regular activities however he will need to refrain from lifting weights until after his 3 month follow up. Patient verbalized understanding. Dr. Caryl Comes in to assess incision and agrees with plan.

## 2021-10-22 NOTE — Patient Instructions (Addendum)
It was great to see you today! No medication changes are needed at this time.  Labs today We will only contact you if something comes back abnormal or we need to make some changes. Otherwise no news is good news!  Your physician recommends that you schedule a follow-up appointment in: 3 months with Dr Shirlee Latch and echo  Your physician has requested that you have an echocardiogram. Echocardiography is a painless test that uses sound waves to create images of your heart. It provides your doctor with information about the size and shape of your heart and how well your hearts chambers and valves are working. This procedure takes approximately one hour. There are no restrictions for this procedure.  Do the following things EVERYDAY: Weigh yourself in the morning before breakfast. Write it down and keep it in a log. Take your medicines as prescribed Eat low salt foods--Limit salt (sodium) to 2000 mg per day.  Stay as active as you can everyday Limit all fluids for the day to less than 2 liters  At [facility], you and your health needs are our priority. As part of our continuing mission to provide you with exceptional heart care, we have created designated Provider Care Teams. These Care Teams include your primary Cardiologist (physician) and Advanced Practice Providers (APPs- Physician Assistants and Nurse Practitioners) who all work together to provide you with the care you need, when you need it.   You may see any of the following providers on your designated Care Team at your next follow up: Dr Arvilla Meres Dr Carron Curie, NP Robbie Lis, [place] [facility] [place], [place] Karle Plumber, PharmD   Please be sure to bring in all your medications bottles to every appointment.    If you have any questions or concerns before your next appointment please send [place] a message through [place] or call our office at [phone].    TO LEAVE A MESSAGE  FOR THE NURSE SELECT OPTION 2, PLEASE LEAVE A MESSAGE INCLUDING: YOUR NAME DATE OF BIRTH CALL BACK NUMBER REASON FOR CALL**this is important as we prioritize the call backs  YOU WILL RECEIVE A CALL BACK THE SAME DAY AS LONG AS YOU CALL BEFORE 4:00 PM

## 2021-12-01 DIAGNOSIS — Z95810 Presence of automatic (implantable) cardiac defibrillator: Secondary | ICD-10-CM | POA: Insufficient documentation

## 2021-12-03 ENCOUNTER — Ambulatory Visit (INDEPENDENT_AMBULATORY_CARE_PROVIDER_SITE_OTHER): Payer: BC Managed Care – PPO

## 2021-12-03 DIAGNOSIS — Z95810 Presence of automatic (implantable) cardiac defibrillator: Secondary | ICD-10-CM | POA: Diagnosis not present

## 2021-12-04 ENCOUNTER — Encounter: Payer: BC Managed Care – PPO | Admitting: Internal Medicine

## 2021-12-04 DIAGNOSIS — Z95810 Presence of automatic (implantable) cardiac defibrillator: Secondary | ICD-10-CM

## 2021-12-04 DIAGNOSIS — I5022 Chronic systolic (congestive) heart failure: Secondary | ICD-10-CM

## 2021-12-04 LAB — CUP PACEART REMOTE DEVICE CHECK
Battery Remaining Longevity: 43 mo
Battery Remaining Percentage: 93 %
Battery Voltage: 3.13 V
Brady Statistic AP VP Percent: 1 %
Brady Statistic AP VS Percent: 1 %
Brady Statistic AS VP Percent: 97 %
Brady Statistic AS VS Percent: 2.7 %
Brady Statistic RA Percent Paced: 1 %
Date Time Interrogation Session: 20230330180156
HighPow Impedance: 66 Ohm
HighPow Impedance: 66 Ohm
Implantable Lead Implant Date: 20221228
Implantable Lead Implant Date: 20221228
Implantable Lead Implant Date: 20221228
Implantable Lead Location: 753858
Implantable Lead Location: 753859
Implantable Lead Location: 753860
Implantable Lead Model: 7122
Implantable Pulse Generator Implant Date: 20221228
Lead Channel Impedance Value: 450 Ohm
Lead Channel Impedance Value: 480 Ohm
Lead Channel Impedance Value: 610 Ohm
Lead Channel Pacing Threshold Amplitude: 0.5 V
Lead Channel Pacing Threshold Amplitude: 0.5 V
Lead Channel Pacing Threshold Amplitude: 1.25 V
Lead Channel Pacing Threshold Pulse Width: 0.5 ms
Lead Channel Pacing Threshold Pulse Width: 0.5 ms
Lead Channel Pacing Threshold Pulse Width: 0.8 ms
Lead Channel Sensing Intrinsic Amplitude: 11.4 mV
Lead Channel Sensing Intrinsic Amplitude: 3.1 mV
Lead Channel Setting Pacing Amplitude: 3.5 V
Lead Channel Setting Pacing Amplitude: 3.5 V
Lead Channel Setting Pacing Amplitude: 3.5 V
Lead Channel Setting Pacing Pulse Width: 0.5 ms
Lead Channel Setting Pacing Pulse Width: 0.8 ms
Lead Channel Setting Sensing Sensitivity: 0.5 mV
Pulse Gen Serial Number: 8926485

## 2021-12-08 ENCOUNTER — Encounter: Payer: Self-pay | Admitting: Internal Medicine

## 2021-12-08 ENCOUNTER — Ambulatory Visit (INDEPENDENT_AMBULATORY_CARE_PROVIDER_SITE_OTHER): Payer: BC Managed Care – PPO | Admitting: Internal Medicine

## 2021-12-08 VITALS — BP 106/60 | HR 78 | Ht 68.0 in | Wt 159.0 lb

## 2021-12-08 DIAGNOSIS — Z95810 Presence of automatic (implantable) cardiac defibrillator: Secondary | ICD-10-CM

## 2021-12-08 DIAGNOSIS — I5022 Chronic systolic (congestive) heart failure: Secondary | ICD-10-CM | POA: Diagnosis not present

## 2021-12-08 NOTE — Progress Notes (Signed)
? ? ? ? ?Patient Care Team: ?Deatra James, MD as PCP - General (Family Medicine) ? ? ?HPI ? ?[name] is a 61 y.o. male seen in follow-up for CRT-D implanted 1/23 for primary prevention in the setting of nonischemic cardiomyopathy ? ?Repeat echo scheduled 5/23 ? ?Not appreciably better  or worse follow CRT.  Denies chest pain shortness of breath or edema. ? ?DATE TEST EF    ?3/11 Echo   20-25 %    ?3/11 LHC   Non obstructive CAD  ?9/17 LHC 45% Non obstructive CAD  ?4/21 Echo  40-45%    ?5/22 Echo  25-35%    ?  ?Date Cr K Hgb  ?7/22 0.95 4.3 12.8 (12/20)  ?9/22  1.00 4.5    ?2/23 0.88 4.1 12.0  ?  ? ?Past Medical History:  ?Diagnosis Date  ? CAD (coronary artery disease)   ? nonobstructive: LHC as above with mild disease  ? CHF (congestive heart failure) (HCC)   ? Chronic systolic heart failure (HCC)   ? History of echocardiogram   ? Echo 5/16:  EF 50-55%, diff HK, Gr 1 DD, mod MR, LA upper limits normal, mobile atrial septum (cannot r/p PFO)  ? HLD (hyperlipidemia)   ? Nonischemic cardiomyopathy (HCC)   ? LHC 3/11 showed EF 15%, 40% ostial CFX, 40% prox RCA, 40-50% mid RCa. Echo 3/11 moderate dilated LV, EF 20-25% global hk  ? Personal history of unspecified circulatory disease   ? ? ?Past Surgical History:  ?Procedure Laterality Date  ? BIV ICD INSERTION CRT-D N/A 09/02/2021  ? Procedure: BIV ICD INSERTION CRT-D;  Surgeon: Duke Salvia, MD;  Location: [facility] INVASIVE CV LAB;  Service: Cardiovascular;  Laterality: N/A;  ? CARDIAC CATHETERIZATION    ? 6 years ago  ? CARDIAC CATHETERIZATION N/A 05/26/2016  ? Procedure: Left Heart Cath and Coronary Angiography;  Surgeon: Laurey Morale, MD;  Location: [facility] INVASIVE CV LAB;  Service: Cardiovascular;  Laterality: N/A;  ? ? ?Current Meds  ?Medication Sig  ? aspirin (ASPIR-81) 81 MG EC tablet Take 1 tablet (81 mg total) by mouth daily.  ? atorvastatin (LIPITOR) 20 MG tablet TAKE 1 TABLET DAILY  ? carvedilol (COREG) 25 MG tablet TAKE 1 TABLET TWICE A DAY WITH MEALS  ?  ENTRESTO 97-103 MG TAKE 1 TABLET BY MOUTH TWICE A DAY  ? FARXIGA 10 MG TABS tablet TAKE 1 TABLET BY MOUTH DAILY BEFORE BREAKFAST.  ? icosapent Ethyl (VASCEPA) 1 g capsule TAKE 2 CAPSULES TWICE A DAY  ? isosorbide-hydrALAZINE (BIDIL) 20-37.5 MG tablet Take 1 tablet by mouth 3 (three) times daily.  ? spironolactone (ALDACTONE) 25 MG tablet TAKE 1 TABLET DAILY  ? ? ?Allergies  ?Allergen Reactions  ? Penicillins Rash  ?  Has patient had a PCN reaction causing immediate rash, facial/tongue/throat swelling, SOB or lightheadedness with hypotension:UNSURE ?Has patient had a PCN reaction causing severe rash involving mucus membranes or skin necrosis:UNSURE ?Has patient had a PCN reaction that required hospitalization:YES ?Has patient had a PCN reaction occurring within the last 10 years:NO ?If all of the above answers are "NO", then may proceed with Cephalosporin use.  ? ? ? ? ?Review of Systems negative except from HPI and PMH ? ?Physical Exam ?BP 106/60   Pulse 78   Ht 5\' 8"  (1.727 m)   Wt 159 lb (72.1 kg)   SpO2 98%   BMI 24.18 kg/m?  ?Well developed and well nourished in no acute distress ?HENT normal ?E  scleral and icterus clear ?Neck Supple ?JVP flat; carotids brisk and full ?Clear to ausculation ?Regular rate and rhythm, no murmurs gallops or rub ?Soft with active bowel sounds ?No clubbing cyanosis  Edema ?Alert and oriented, grossly normal motor and sensory function ?Skin Warm and Dry ? ?ECG P synchronous pacing at 74 ? 13/12/41 ?Negative QRS lead V1 and positive QRS lead I ? ?Reconfigured pacing configuration D-M2 associate with a QRSd of 126 ms with a RSR prime in lead I ? ?Preimplant QRSd 156 ms ? ?CrCl cannot be calculated (Patient's most recent lab result is older than the maximum 21 days allowed.). ? ? ?Assessment and  Plan ?Nonischemic cardiomyopathy ? ?Left bundle branch block-atypical ? ?ICD-CRT-[employer] ? ?Anemia-chronic ? ? ?LV threshold is high.  X-ray was reviewed.  We will incorporate the distal  tip.  Intrinsic AV interval is about 105 ms we will shorten the sensed AV delay to 90 ms. ? ?Rechecked electrocardiogram having reprogrammed the pacing configuration from M2-coil to M2-D ? ?Euvolemic.- ?Continue spironolactone at 20 ? ?Cardiomyopathy, we will continue his Farxiga 10 Entresto 97/103 carvedilol 25. ? ? ? ?Current medicines are reviewed at length with the patient today .  The patient does not  have concerns regarding medicines. ? ?

## 2021-12-08 NOTE — Patient Instructions (Signed)
Medication Instructions:  Your physician recommends that you continue on your current medications as directed. Please refer to the Current Medication list given to you today.  *If you need a refill on your cardiac medications before your next appointment, please call your pharmacy*   Lab Work: None ordered.  If you have labs (blood work) drawn today and your tests are completely normal, you will receive your results only by: . MyChart Message (if you have MyChart) OR . A paper copy in the mail If you have any lab test that is abnormal or we need to change your treatment, we will call you to review the results.   Testing/Procedures: None ordered.    Follow-Up: At [facility], you and your health needs are our priority.  As part of our continuing mission to provide you with exceptional heart care, we have created designated Provider Care Teams.  These Care Teams include your primary Cardiologist (physician) and Advanced Practice Providers (APPs -  Physician Assistants and Nurse Practitioners) who all work together to provide you with the care you need, when you need it.  We recommend signing up for the patient portal called "MyChart".  Sign up information is provided on this After Visit Summary.  MyChart is used to connect with patients for Virtual Visits (Telemedicine).  Patients are able to view lab/test results, encounter notes, upcoming appointments, etc.  Non-urgent messages can be sent to your provider as well.   To learn more about what you can do with MyChart, go to [email or web address].    Your next appointment:   9 months  The format for your next appointment:   In Person  Provider:   Steven Klein, MD     

## 2021-12-09 ENCOUNTER — Other Ambulatory Visit (HOSPITAL_COMMUNITY): Payer: Self-pay | Admitting: Cardiology

## 2021-12-09 DIAGNOSIS — I5022 Chronic systolic (congestive) heart failure: Secondary | ICD-10-CM

## 2021-12-15 NOTE — Progress Notes (Signed)
Remote ICD transmission.   

## 2022-01-25 ENCOUNTER — Encounter (HOSPITAL_COMMUNITY): Payer: Self-pay | Admitting: Cardiology

## 2022-01-25 ENCOUNTER — Ambulatory Visit (HOSPITAL_COMMUNITY)
Admission: RE | Admit: 2022-01-25 | Discharge: 2022-01-25 | Disposition: A | Discharge status: Home / Self Care | Payer: BC Managed Care – PPO | Source: Ambulatory Visit | Attending: Cardiology | Admitting: Cardiology

## 2022-01-25 ENCOUNTER — Ambulatory Visit (HOSPITAL_BASED_OUTPATIENT_CLINIC_OR_DEPARTMENT_OTHER)
Admission: RE | Admit: 2022-01-25 | Discharge: 2022-01-25 | Disposition: A | Discharge status: Home / Self Care | Payer: BC Managed Care – PPO | Source: Ambulatory Visit | Attending: Family Medicine | Admitting: Family Medicine

## 2022-01-25 VITALS — BP 130/84 | HR 75 | Wt 156.6 lb

## 2022-01-25 DIAGNOSIS — Z95810 Presence of automatic (implantable) cardiac defibrillator: Secondary | ICD-10-CM | POA: Insufficient documentation

## 2022-01-25 DIAGNOSIS — Z87891 Personal history of nicotine dependence: Secondary | ICD-10-CM | POA: Diagnosis not present

## 2022-01-25 DIAGNOSIS — Z7984 Long term (current) use of oral hypoglycemic drugs: Secondary | ICD-10-CM | POA: Diagnosis not present

## 2022-01-25 DIAGNOSIS — I428 Other cardiomyopathies: Secondary | ICD-10-CM | POA: Diagnosis not present

## 2022-01-25 DIAGNOSIS — I5022 Chronic systolic (congestive) heart failure: Secondary | ICD-10-CM | POA: Diagnosis not present

## 2022-01-25 DIAGNOSIS — Z7982 Long term (current) use of aspirin: Secondary | ICD-10-CM | POA: Insufficient documentation

## 2022-01-25 DIAGNOSIS — I2510 Atherosclerotic heart disease of native coronary artery without angina pectoris: Secondary | ICD-10-CM | POA: Insufficient documentation

## 2022-01-25 DIAGNOSIS — I110 Hypertensive heart disease with heart failure: Secondary | ICD-10-CM | POA: Diagnosis not present

## 2022-01-25 DIAGNOSIS — Z7902 Long term (current) use of antithrombotics/antiplatelets: Secondary | ICD-10-CM | POA: Diagnosis not present

## 2022-01-25 DIAGNOSIS — E785 Hyperlipidemia, unspecified: Secondary | ICD-10-CM | POA: Diagnosis not present

## 2022-01-25 DIAGNOSIS — Z79899 Other long term (current) drug therapy: Secondary | ICD-10-CM | POA: Insufficient documentation

## 2022-01-25 LAB — BASIC METABOLIC PANEL
Anion gap: 6 (ref 5–15)
BUN: 10 mg/dL (ref 6–20)
CO2: 26 mmol/L (ref 22–32)
Calcium: 9.3 mg/dL (ref 8.9–10.3)
Chloride: 105 mmol/L (ref 98–111)
Creatinine, Ser: 1.04 mg/dL (ref 0.61–1.24)
GFR, Estimated: >60 mL/min (ref >60)
Glucose, Bld: 107 mg/dL — ABNORMAL HIGH (ref 70–99)
Potassium: 3.7 mmol/L (ref 3.5–5.1)
Sodium: 137 mmol/L (ref 135–145)

## 2022-01-25 LAB — LIPID PANEL
Cholesterol: 193 mg/dL (ref 0–200)
HDL: 81 mg/dL (ref >40)
LDL Cholesterol: 78 mg/dL (ref 0–99)
Total CHOL/HDL Ratio: 2.4 RATIO
Triglycerides: 168 mg/dL — ABNORMAL HIGH (ref <150)
VLDL: 34 mg/dL (ref 0–40)

## 2022-01-25 LAB — ECHOCARDIOGRAM COMPLETE
Area-P 1/2: 2 cm2
Calc EF: 46.9 %
S' Lateral: 3.6 cm
Single Plane A2C EF: 48 %
Single Plane A4C EF: 44.3 %

## 2022-01-25 NOTE — Patient Instructions (Signed)
There has been no changes to your medications.  Labs done today, your results will be available in MyChart, we will contact you for abnormal readings.  Repeat blood work in 3 months.  Your physician recommends that you schedule a follow-up appointment in: 6 months.  If you have any questions or concerns before your next appointment please send [place] a message through [place] or call our office at [phone].    TO LEAVE A MESSAGE FOR THE NURSE SELECT OPTION 2, PLEASE LEAVE A MESSAGE INCLUDING: YOUR NAME DATE OF BIRTH CALL BACK NUMBER REASON FOR CALL**this is important as we prioritize the call backs  YOU WILL RECEIVE A CALL BACK THE SAME DAY AS LONG AS YOU CALL BEFORE 4:00 PM  At the [facility], you and your health needs are our priority. As part of our continuing mission to provide you with exceptional heart care, we have created designated Provider Care Teams. These Care Teams include your primary Cardiologist (physician) and Advanced Practice Providers (APPs- Physician Assistants and Nurse Practitioners) who all work together to provide you with the care you need, when you need it.   You may see any of the following providers on your designated Care Team at your next follow up: Dr Arvilla Meres Dr Carron Curie, NP Robbie Lis, [place] [facility] [place], [place] Karle Plumber, PharmD   Please be sure to bring in all your medications bottles to every appointment.

## 2022-01-25 NOTE — Progress Notes (Signed)
Patient ID: [name], male   DOB: Apr 26, 1961, 61 y.o.   MRN: [id number] PCP: Dr. Nancy Fetter Cardiology: Dr. Aundra Dubin  61 y.o. returns for followup of nonischemic cardiomyopathy.  Patient presented to [facility] in 3/11 after 2-3 weeks of shortness of breath.  He had a flu-like illness with congestion and myalgias and had been treated with clarithromycin as an outpatient.  However, he became progressively short of breath so went into the hospital.  He was noted to have BNP 976 and pulmonary edema on CXR.  Echo showed EF 20-25%.  Left heart cath was done showing mild coronary disease, suggesting that this was likely a nonischemic cardiomyopathy.  Patient was diuresed and begun on cardiac meds.  Cardiac MRI showed EF 20% without significant delayed enhancement, so no evidence for infiltrative disease.  Echo was repeated 9/11, showing some improvement.  Would estimate EF at 40% with diffuse hypokinesis.  Echo in 4/14 showed EF 55% with septal hypokinesis.  5/16 echo also showed EF 50-55% range.   Echo in 7/17 showed EF back down to 30-35%.  He had not been taking his medications regularly (thinks he was missing about 50% of doses).  Cardiolite done in 8/17, showing EF 36% and possible inferior MI.  Repeat cardiac cath in 9/17 after abnormal Cardiolite showed diffuse but nonobstructive CAD.  Repeat echo in 2/18 showed EF back up to 50%.    Echo in 1/20 showed EF 40-45% with normal RV size and systolic function. He was started back on Bidil.  Echo in 4/21 showed EF 40-45%, mild diffuse hypokinesis, normal RV.  Echo in 5/22 showed EF 25-30%, normal RV.  He saw Dr. Caryl Comes to discuss CRT-D given LBBB-like IVCD => now with [facility] Jude CRT-D device.   Echo was done today and reviewed, EF 40-45% with normal RV (improvement).   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  He has cut back on ETOH.   He does yardwork, mows his grass.  No exertional dyspnea or orthopnea/PND.  No chest pain. No lightheadedness.  Takes all meds.    St Jude device interrogation: >99% BiV pacing, no VT/AF.   Labs (3/11): UPEP/SPEP negative, ANA negative, urine drug screen negative, HIV negative, TSH normal, LDL 158, HDL 106, TGs 200, BNP 976, ESR 3, K 4, creatinine 1.01 Labs (4/11): BNP 212, K 4.2, creatinine 0.97 Labs (7/11): BNP 81, K 4.1, creatinine 0.9 Labs (10/11): K 4.2, creatinine 0.8, LDL 98, HDL 68 Labs (1/12): K 3.8, creatinine 0.9, BNP 70 Labs (8/12): K 3.8, creatinine 0.9, HDL 90, LDL 48 Labs (3/14): K 3.5, creatinine 0.9 Labs (5/16): LDL 85 Labs (12/16): K 3.9, creatinine 0.86 Labs (7/17): LDL 55, HDL 146, BNP 24, K 3.7, creatinine 0.7 Labs (8/17): K 3.3, creatinine 0.83 Labs (9/17): K 3.9, creatinine 0.85 Labs (10/17): K 3.8, creatinine 1.05 Labs (5/18): K 3.6, creatinine 0.91 Labs (8/18): creatinine 1.12 Labs (12/18): LDL 68 Labs (12/20): BNP 42, K 4, creatinine 1.04, LDL 75, HDL 76 Labs (4/21): K 3.8,creatinine 1.02 Labs (7/21): K 3.9, creatinine 0.86 Labs (4/22): LDL 65 Labs (7/22): K 4.3, creatinine 0.95 Labs (2/23): K 4.1, creatinine 0.88  Allergies:  1)  ! Pcn  Past Medical History: 1. Nonischemic Cardiomyopathy: LHC (3/11) showed EF 15%, 40% ostial CFX, 40% prox RCA, 40-50% mid RCA.  Echo (3/11): Moderately dilated LV, EF 20-25%, global HK, severe diastolic dysfunction, mild RV dilation and dysfunction, moderate MR. SPEP/UPEP negative, ANA negative, HIV negative, TSH normal, no drug  history.  Moderate ETOH use.  Highest suspicion probably viral myocarditis.   Cardiac MRI (4/11) showed a moderately dilated LV with global systolic dysfunction, EF 53%, normal RV size with moderate systolic dysfunction, small area of nonspecific subepicardial basal inferoseptal (RV insertion site) delayed enhancement.  Repeat echo (9/11): EF 40% with mild to moderate diffuse hypokinesis.  Echo (4/14) with EF 55%, septal hypokinesis, mild MR.  - Echo (5/16): EF 50-55%, moderate MR. - Echo (7/17): EF 30-35%, mild  MR.   - LHC (9/17) with nonobstructive CAD.  - Echo (2/18): EF 50%, diffuse hypokinesis, normal RV size and systolic function.  - Echo (1/20): EF 40-45%, normal RV size and systolic function.  - Echo (4/21): EF 40-45%, normal RV  - Echo (5/22): EF 25-30%, normal RV. - St Jude CRT-D device - Echo (5/23): EF 40-45% with normal RV (improvement).  2. Nonobstructive CAD:  - LHC (3/11) showed EF 15%, 40% ostial CFX, 40% prox RCA, 40-50% mid RCA.  - Cardiolite (8/17) with EF 36%, possible inferior MI.  - LHC (9/17) with EF 45%, 50% ostial stenosis small AV LCx, diffuse RCA disease, 40% proximal/50% mid.  3. Smoking 4. Hyperlipidemia  Family History: The patient is adopted so does not know his family history.   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  smoked half pack per day for over 30 years, quit in 3/21.  Alcohol Use - yes, 2-3 drinks about 3-4 times a week in the past, has now cut back. Married, has two children.    ROS: All systems reviewed and negative except as per HPI.   Current Outpatient Medications  Medication Sig Dispense Refill   aspirin (ASPIR-81) 81 MG EC tablet Take 1 tablet (81 mg total) by mouth daily. 90 tablet 3   atorvastatin (LIPITOR) 20 MG tablet TAKE 1 TABLET DAILY 90 tablet 3   carvedilol (COREG) 25 MG tablet TAKE 1 TABLET TWICE A DAY WITH MEALS 180 tablet 3   ENTRESTO 97-103 MG TAKE 1 TABLET BY MOUTH TWICE A DAY 60 tablet 3   FARXIGA 10 MG TABS tablet TAKE 1 TABLET BY MOUTH DAILY BEFORE BREAKFAST. 90 tablet 3   icosapent Ethyl (VASCEPA) 1 g capsule TAKE 2 CAPSULES TWICE A DAY 336 capsule 3   isosorbide-hydrALAZINE (BIDIL) 20-37.5 MG tablet Take 1 tablet by mouth 3 (three) times daily. 135 tablet 11   spironolactone (ALDACTONE) 25 MG tablet TAKE 1 TABLET DAILY 90 tablet 3   No current facility-administered medications for this encounter.    BP 130/84   Pulse 75   Wt 71 kg (156 lb 9.6 oz)   SpO2 100%   BMI 23.81  kg/m  General: NAD Neck: No JVD, no thyromegaly or thyroid nodule.  Lungs: Clear to auscultation bilaterally with normal respiratory effort. CV: Nondisplaced PMI.  Heart regular S1/S2, no S3/S4, no murmur.  No peripheral edema.  No carotid bruit.  Normal pedal pulses.  Abdomen: Soft, nontender, no hepatosplenomegaly, no distention.  Skin: Intact without lesions or rashes.  Neurologic: Alert and oriented x 3.  Psych: Normal affect. Extremities: No clubbing or cyanosis.  HEENT: Normal.   Assessment/Plan: 1. Chronic systolic CHF: History of nonischemic cardiomyopathy, had recovered to near normal on 2016 echo but EF down to 30-35% on 7/17 echo.  Repeat LHC in 9/17 showed diffuse but nonobstructive CAD.  EF was up to 50% on 2/18 echo, but was down some on 1/20 echo to 40-45% and remained 40-45% on  4/21 echo. In 5/22 despite good compliance with GDMT, echo showed EF 25-30%. St Jude CRT-D placed.  Echo today showed improvement with EF up to 40-45%.   Not volume overloaded, NYHA class I symptoms.   - Continue Coreg and spironolactone, on goal doses.  - Continue Bidil 1 tab tid. He has not tolerated increase in the past due to lightheadedness.  - Continue Entresto 97/103 bid. - Continue Farxiga 10 mg daily.  - BMET today  - ETOH cut back to minimal.  2. HTN: BP controlled.    3. Hyperlipidemia: On statin and Vascepa.     - Check lipids today.   4. Smoking: He has now quit.  5. CAD: Diffuse nonobstructive disease on 9/17 cath. Continue ASA 81 and atorvastatin with goal LDL < 70.  - Check lipids today.   BMET in 3 months, see APP in 6 months.   Loralie Champagne 01/25/2022

## 2022-01-25 NOTE — Progress Notes (Signed)
  Echocardiogram 2D Echocardiogram has been performed.  Augustine Radar 01/25/2022, 8:54 AM

## 2022-01-28 ENCOUNTER — Other Ambulatory Visit (HOSPITAL_COMMUNITY): Payer: Self-pay | Admitting: *Deleted

## 2022-01-28 MED ORDER — SPIRONOLACTONE 25 MG PO TABS: 25.0000 mg | ORAL_TABLET | Freq: Every day | ORAL | 0 refills | Status: DC

## 2022-03-04 ENCOUNTER — Ambulatory Visit: Payer: BC Managed Care – PPO

## 2022-04-04 LAB — CUP PACEART REMOTE DEVICE CHECK
Battery Remaining Longevity: 77 mo
Battery Remaining Percentage: 89 %
Battery Voltage: 3.11 V
Brady Statistic AP VP Percent: 1 %
Brady Statistic AP VS Percent: 1 %
Brady Statistic AS VP Percent: 99 %
Brady Statistic AS VS Percent: 1 %
Brady Statistic RA Percent Paced: 1 %
Date Time Interrogation Session: 20230629040018
HighPow Impedance: 56 Ohm
HighPow Impedance: 56 Ohm
Implantable Lead Implant Date: 20221228
Implantable Lead Implant Date: 20221228
Implantable Lead Implant Date: 20221228
Implantable Lead Location: 753858
Implantable Lead Location: 753859
Implantable Lead Location: 753860
Implantable Lead Model: 7122
Implantable Pulse Generator Implant Date: 20221228
Lead Channel Impedance Value: 450 Ohm
Lead Channel Impedance Value: 590 Ohm
Lead Channel Impedance Value: 890 Ohm
Lead Channel Pacing Threshold Amplitude: 0.5 V
Lead Channel Pacing Threshold Amplitude: 0.5 V
Lead Channel Pacing Threshold Amplitude: 1 V
Lead Channel Pacing Threshold Pulse Width: 0.5 ms
Lead Channel Pacing Threshold Pulse Width: 0.5 ms
Lead Channel Pacing Threshold Pulse Width: 0.8 ms
Lead Channel Sensing Intrinsic Amplitude: 11.4 mV
Lead Channel Sensing Intrinsic Amplitude: 3.4 mV
Lead Channel Setting Pacing Amplitude: 1.75 V
Lead Channel Setting Pacing Amplitude: 1.75 V
Lead Channel Setting Pacing Amplitude: 2 V
Lead Channel Setting Pacing Pulse Width: 0.5 ms
Lead Channel Setting Pacing Pulse Width: 0.8 ms
Lead Channel Setting Sensing Sensitivity: 0.5 mV
Pulse Gen Serial Number: 8926485

## 2022-04-25 ENCOUNTER — Telehealth: Payer: Self-pay | Admitting: Cardiology

## 2022-04-25 ENCOUNTER — Other Ambulatory Visit: Payer: Self-pay | Admitting: Cardiology

## 2022-04-25 DIAGNOSIS — I5022 Chronic systolic (congestive) heart failure: Secondary | ICD-10-CM

## 2022-04-25 MED ORDER — CARVEDILOL 25 MG PO TABS: 25.0000 mg | ORAL_TABLET | Freq: Two times a day (BID) | ORAL | 0 refills | Status: DC

## 2022-04-25 MED ORDER — ATORVASTATIN CALCIUM 20 MG PO TABS: 20.0000 mg | ORAL_TABLET | Freq: Every day | ORAL | 0 refills | Status: DC

## 2022-04-25 MED ORDER — ENTRESTO 97-103 MG PO TABS: 1.0000 | ORAL_TABLET | Freq: Two times a day (BID) | ORAL | 0 refills | Status: DC

## 2022-04-25 MED ORDER — SPIRONOLACTONE 25 MG PO TABS: 25.0000 mg | ORAL_TABLET | Freq: Every day | ORAL | 0 refills | Status: DC

## 2022-04-25 MED ORDER — ISOSORB DINITRATE-HYDRALAZINE 20-37.5 MG PO TABS: 1.0000 | ORAL_TABLET | Freq: Three times a day (TID) | ORAL | 0 refills | Status: DC

## 2022-04-25 MED ORDER — ASPIRIN 81 MG PO TBEC: 81.0000 mg | DELAYED_RELEASE_TABLET | Freq: Every day | ORAL | 3 refills | Status: DC

## 2022-04-25 MED ORDER — DAPAGLIFLOZIN PROPANEDIOL 10 MG PO TABS: 10.0000 mg | ORAL_TABLET | Freq: Every day | ORAL | 0 refills | Status: DC

## 2022-04-25 NOTE — Telephone Encounter (Signed)
Pt is in [place] [facility] and forgot meds, I sent to [facility] in [place] and notified pharmacy there as well.

## 2022-04-27 ENCOUNTER — Other Ambulatory Visit (HOSPITAL_COMMUNITY): Payer: BC Managed Care – PPO

## 2022-05-03 ENCOUNTER — Ambulatory Visit (HOSPITAL_COMMUNITY)
Admission: RE | Admit: 2022-05-03 | Discharge: 2022-05-03 | Disposition: A | Discharge status: Home / Self Care | Payer: BC Managed Care – PPO | Source: Ambulatory Visit | Attending: Cardiology | Admitting: Cardiology

## 2022-05-03 DIAGNOSIS — I5022 Chronic systolic (congestive) heart failure: Secondary | ICD-10-CM | POA: Insufficient documentation

## 2022-05-03 LAB — BASIC METABOLIC PANEL
Anion gap: 8 (ref 5–15)
BUN: 19 mg/dL (ref 8–23)
CO2: 24 mmol/L (ref 22–32)
Calcium: 8.8 mg/dL — ABNORMAL LOW (ref 8.9–10.3)
Chloride: 106 mmol/L (ref 98–111)
Creatinine, Ser: 1.21 mg/dL (ref 0.61–1.24)
GFR, Estimated: >60 mL/min (ref >60)
Glucose, Bld: 120 mg/dL — ABNORMAL HIGH (ref 70–99)
Potassium: 3.7 mmol/L (ref 3.5–5.1)
Sodium: 138 mmol/L (ref 135–145)

## 2022-06-03 ENCOUNTER — Ambulatory Visit (INDEPENDENT_AMBULATORY_CARE_PROVIDER_SITE_OTHER): Payer: BC Managed Care – PPO

## 2022-06-03 DIAGNOSIS — I5022 Chronic systolic (congestive) heart failure: Secondary | ICD-10-CM

## 2022-06-03 LAB — CUP PACEART REMOTE DEVICE CHECK
Battery Remaining Longevity: 75 mo
Battery Remaining Percentage: 86 %
Battery Voltage: 3.05 V
Brady Statistic AP VP Percent: 1 %
Brady Statistic AP VS Percent: 1 %
Brady Statistic AS VP Percent: 99 %
Brady Statistic AS VS Percent: 1 %
Brady Statistic RA Percent Paced: 1 %
Date Time Interrogation Session: 20230928050820
HighPow Impedance: 61 Ohm
HighPow Impedance: 61 Ohm
Implantable Lead Implant Date: 20221228
Implantable Lead Implant Date: 20221228
Implantable Lead Implant Date: 20221228
Implantable Lead Location: 753858
Implantable Lead Location: 753859
Implantable Lead Location: 753860
Implantable Lead Model: 7122
Implantable Pulse Generator Implant Date: 20221228
Lead Channel Impedance Value: 460 Ohm
Lead Channel Impedance Value: 610 Ohm
Lead Channel Impedance Value: 960 Ohm
Lead Channel Pacing Threshold Amplitude: 0.5 V
Lead Channel Pacing Threshold Amplitude: 0.5 V
Lead Channel Pacing Threshold Amplitude: 1 V
Lead Channel Pacing Threshold Pulse Width: 0.5 ms
Lead Channel Pacing Threshold Pulse Width: 0.5 ms
Lead Channel Pacing Threshold Pulse Width: 0.8 ms
Lead Channel Sensing Intrinsic Amplitude: 11.4 mV
Lead Channel Sensing Intrinsic Amplitude: 4.2 mV
Lead Channel Setting Pacing Amplitude: 1.75 V
Lead Channel Setting Pacing Amplitude: 1.75 V
Lead Channel Setting Pacing Amplitude: 2 V
Lead Channel Setting Pacing Pulse Width: 0.5 ms
Lead Channel Setting Pacing Pulse Width: 0.8 ms
Lead Channel Setting Sensing Sensitivity: 0.5 mV
Pulse Gen Serial Number: 8926485

## 2022-06-04 ENCOUNTER — Other Ambulatory Visit (HOSPITAL_COMMUNITY): Payer: Self-pay | Admitting: Cardiology

## 2022-06-04 DIAGNOSIS — I5022 Chronic systolic (congestive) heart failure: Secondary | ICD-10-CM

## 2022-06-09 NOTE — Progress Notes (Signed)
Remote ICD transmission.   

## 2022-06-23 ENCOUNTER — Other Ambulatory Visit (HOSPITAL_COMMUNITY): Payer: Self-pay | Admitting: Cardiology

## 2022-06-24 ENCOUNTER — Other Ambulatory Visit (HOSPITAL_COMMUNITY): Payer: Self-pay | Admitting: *Deleted

## 2022-06-24 MED ORDER — DAPAGLIFLOZIN PROPANEDIOL 10 MG PO TABS: 10.0000 mg | ORAL_TABLET | Freq: Every day | ORAL | 3 refills | Status: DC

## 2022-06-24 MED ORDER — ENTRESTO 97-103 MG PO TABS
1.0000 | ORAL_TABLET | Freq: Two times a day (BID) | ORAL | 3 refills | Status: DC
Start: 2022-06-24 — End: 2023-04-11

## 2022-06-25 ENCOUNTER — Telehealth (HOSPITAL_COMMUNITY): Payer: Self-pay | Admitting: Pharmacist

## 2022-06-25 NOTE — Telephone Encounter (Signed)
Advanced Heart Failure Patient Advocate Encounter  Prior Authorization for [place] has been approved.    PA# [id number] Effective dates: 05/26/22 through 06/25/23  Audry Riles, PharmD, BCPS, BCCP, CPP [facility] Pharmacist [phone]

## 2022-07-26 NOTE — Progress Notes (Signed)
Patient ID: [name], male   DOB: 09-Jan-1961, 61 y.o.   MRN: [id number] PCP: Dr. Nancy Fetter Cardiology: Dr. Aundra Dubin  61 y.o. returns for followup of nonischemic cardiomyopathy.  Patient presented to [facility] in 3/11 after 2-3 weeks of shortness of breath.  He had a flu-like illness with congestion and myalgias and had been treated with clarithromycin as an outpatient.  However, he became progressively short of breath so went into the hospital.  He was noted to have BNP 976 and pulmonary edema on CXR.  Echo showed EF 20-25%.  Left heart cath was done showing mild coronary disease, suggesting that this was likely a nonischemic cardiomyopathy.  Patient was diuresed and begun on cardiac meds.  Cardiac MRI showed EF 20% without significant delayed enhancement, so no evidence for infiltrative disease.  Echo was repeated 9/11, showing some improvement.  Would estimate EF at 40% with diffuse hypokinesis.  Echo in 4/14 showed EF 55% with septal hypokinesis.  5/16 echo also showed EF 50-55% range.   Echo in 7/17 showed EF back down to 30-35%.  He had not been taking his medications regularly (thinks he was missing about 50% of doses).  Cardiolite done in 8/17, showing EF 36% and possible inferior MI.  Repeat cardiac cath in 9/17 after abnormal Cardiolite showed diffuse but nonobstructive CAD.  Repeat echo in 2/18 showed EF back up to 50%.    Echo in 1/20 showed EF 40-45% with normal RV size and systolic function. He was started back on Bidil.  Echo in 4/21 showed EF 40-45%, mild diffuse hypokinesis, normal RV.  Echo in 5/22 showed EF 25-30%, normal RV.  He saw Dr. Caryl Comes to discuss CRT-D given LBBB-like IVCD => now with [facility] Jude CRT-D device.   Echo was done today and reviewed, EF 40-45% with normal RV (improvement).   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  He has cut back on ETOH.   He does yardwork, mows his grass.  No exertional dyspnea or orthopnea/PND.  No chest pain. No lightheadedness.  Takes all meds.    St Jude device interrogation: >99% BiV pacing, no VT/AF.   Labs (3/11): UPEP/SPEP negative, ANA negative, urine drug screen negative, HIV negative, TSH normal, LDL 158, HDL 106, TGs 200, BNP 976, ESR 3, K 4, creatinine 1.01 Labs (4/11): BNP 212, K 4.2, creatinine 0.97 Labs (7/11): BNP 81, K 4.1, creatinine 0.9 Labs (10/11): K 4.2, creatinine 0.8, LDL 98, HDL 68 Labs (1/12): K 3.8, creatinine 0.9, BNP 70 Labs (8/12): K 3.8, creatinine 0.9, HDL 90, LDL 48 Labs (3/14): K 3.5, creatinine 0.9 Labs (5/16): LDL 85 Labs (12/16): K 3.9, creatinine 0.86 Labs (7/17): LDL 55, HDL 146, BNP 24, K 3.7, creatinine 0.7 Labs (8/17): K 3.3, creatinine 0.83 Labs (9/17): K 3.9, creatinine 0.85 Labs (10/17): K 3.8, creatinine 1.05 Labs (5/18): K 3.6, creatinine 0.91 Labs (8/18): creatinine 1.12 Labs (12/18): LDL 68 Labs (12/20): BNP 42, K 4, creatinine 1.04, LDL 75, HDL 76 Labs (4/21): K 3.8,creatinine 1.02 Labs (7/21): K 3.9, creatinine 0.86 Labs (4/22): LDL 65 Labs (7/22): K 4.3, creatinine 0.95 Labs (2/23): K 4.1, creatinine 0.88  Allergies:  1)  ! Pcn  Past Medical History: 1. Nonischemic Cardiomyopathy: LHC (3/11) showed EF 15%, 40% ostial CFX, 40% prox RCA, 40-50% mid RCA.  Echo (3/11): Moderately dilated LV, EF 20-25%, global HK, severe diastolic dysfunction, mild RV dilation and dysfunction, moderate MR. SPEP/UPEP negative, ANA negative, HIV negative, TSH normal, no drug  history.  Moderate ETOH use.  Highest suspicion probably viral myocarditis.   Cardiac MRI (4/11) showed a moderately dilated LV with global systolic dysfunction, EF 70%, normal RV size with moderate systolic dysfunction, small area of nonspecific subepicardial basal inferoseptal (RV insertion site) delayed enhancement.  Repeat echo (9/11): EF 40% with mild to moderate diffuse hypokinesis.  Echo (4/14) with EF 55%, septal hypokinesis, mild MR.  - Echo (5/16): EF 50-55%, moderate MR. - Echo (7/17): EF 30-35%, mild  MR.   - LHC (9/17) with nonobstructive CAD.  - Echo (2/18): EF 50%, diffuse hypokinesis, normal RV size and systolic function.  - Echo (1/20): EF 40-45%, normal RV size and systolic function.  - Echo (4/21): EF 40-45%, normal RV  - Echo (5/22): EF 25-30%, normal RV. - St Jude CRT-D device - Echo (5/23): EF 40-45% with normal RV (improvement).  2. Nonobstructive CAD:  - LHC (3/11) showed EF 15%, 40% ostial CFX, 40% prox RCA, 40-50% mid RCA.  - Cardiolite (8/17) with EF 36%, possible inferior MI.  - LHC (9/17) with EF 45%, 50% ostial stenosis small AV LCx, diffuse RCA disease, 40% proximal/50% mid.  3. Smoking 4. Hyperlipidemia  Family History: The patient is adopted so does not know his family history.   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  smoked half pack per day for over 30 years, quit in 3/21.  Alcohol Use - yes, 2-3 drinks about 3-4 times a week in the past, has now cut back. Married, has two children.    ROS: All systems reviewed and negative except as per HPI.   Current Outpatient Medications  Medication Sig Dispense Refill   aspirin EC (ASPIRIN 81) 81 MG tablet Take 1 tablet (81 mg total) by mouth daily. 90 tablet 3   atorvastatin (LIPITOR) 20 MG tablet TAKE 1 TABLET DAILY 90 tablet 3   carvedilol (COREG) 25 MG tablet Take 1 tablet (25 mg total) by mouth 2 (two) times daily with a meal. 12 tablet 0   dapagliflozin propanediol (FARXIGA) 10 MG TABS tablet Take 1 tablet (10 mg total) by mouth daily before breakfast. 30 tablet 3   icosapent Ethyl (VASCEPA) 1 g capsule TAKE 2 CAPSULES TWICE A DAY 336 capsule 3   isosorbide-hydrALAZINE (BIDIL) 20-37.5 MG tablet Take 1 tablet by mouth 3 (three) times daily. 18 tablet 0   sacubitril-valsartan (ENTRESTO) 97-103 MG Take 1 tablet by mouth 2 (two) times daily. 60 tablet 3   spironolactone (ALDACTONE) 25 MG tablet TAKE 1 TABLET DAILY 90 tablet 3   No current facility-administered  medications for this visit.    There were no vitals taken for this visit. General: NAD Neck: No JVD, no thyromegaly or thyroid nodule.  Lungs: Clear to auscultation bilaterally with normal respiratory effort. CV: Nondisplaced PMI.  Heart regular S1/S2, no S3/S4, no murmur.  No peripheral edema.  No carotid bruit.  Normal pedal pulses.  Abdomen: Soft, nontender, no hepatosplenomegaly, no distention.  Skin: Intact without lesions or rashes.  Neurologic: Alert and oriented x 3.  Psych: Normal affect. Extremities: No clubbing or cyanosis.  HEENT: Normal.   Assessment/Plan: 1. Chronic systolic CHF: History of nonischemic cardiomyopathy, had recovered to near normal on 2016 echo but EF down to 30-35% on 7/17 echo.  Repeat LHC in 9/17 showed diffuse but nonobstructive CAD.  EF was up to 50% on 2/18 echo, but was down some on 1/20 echo to 40-45% and remained 40-45% on 4/21 echo.  In 5/22 despite good compliance with GDMT, echo showed EF 25-30%. St Jude CRT-D placed.  Echo today showed improvement with EF up to 40-45%.   Not volume overloaded, NYHA class I symptoms.   - Continue Coreg and spironolactone, on goal doses.  - Continue Bidil 1 tab tid. He has not tolerated increase in the past due to lightheadedness.  - Continue Entresto 97/103 bid. - Continue Farxiga 10 mg daily.  - BMET today  - ETOH cut back to minimal.  2. HTN: BP controlled.    3. Hyperlipidemia: On statin and Vascepa.     - Check lipids today.   4. Smoking: He has now quit.  5. CAD: Diffuse nonobstructive disease on 9/17 cath. Continue ASA 81 and atorvastatin with goal LDL < 70.  - Check lipids today.   BMET in 3 months, see APP in 6 months.   Higginson 07/26/2022

## 2022-07-27 ENCOUNTER — Encounter (HOSPITAL_COMMUNITY): Payer: Self-pay

## 2022-07-27 ENCOUNTER — Ambulatory Visit (HOSPITAL_COMMUNITY)
Admission: RE | Admit: 2022-07-27 | Discharge: 2022-07-27 | Disposition: A | Discharge status: Home / Self Care | Payer: BC Managed Care – PPO | Source: Ambulatory Visit | Attending: Family Medicine | Admitting: Family Medicine

## 2022-07-27 VITALS — BP 120/80 | HR 76 | Wt 155.0 lb

## 2022-07-27 DIAGNOSIS — Z79899 Other long term (current) drug therapy: Secondary | ICD-10-CM | POA: Diagnosis not present

## 2022-07-27 DIAGNOSIS — I2510 Atherosclerotic heart disease of native coronary artery without angina pectoris: Secondary | ICD-10-CM | POA: Diagnosis not present

## 2022-07-27 DIAGNOSIS — I5022 Chronic systolic (congestive) heart failure: Secondary | ICD-10-CM

## 2022-07-27 DIAGNOSIS — E785 Hyperlipidemia, unspecified: Secondary | ICD-10-CM

## 2022-07-27 DIAGNOSIS — Z95810 Presence of automatic (implantable) cardiac defibrillator: Secondary | ICD-10-CM | POA: Diagnosis not present

## 2022-07-27 DIAGNOSIS — Z7982 Long term (current) use of aspirin: Secondary | ICD-10-CM | POA: Insufficient documentation

## 2022-07-27 DIAGNOSIS — I428 Other cardiomyopathies: Secondary | ICD-10-CM | POA: Insufficient documentation

## 2022-07-27 DIAGNOSIS — Z7984 Long term (current) use of oral hypoglycemic drugs: Secondary | ICD-10-CM | POA: Diagnosis not present

## 2022-07-27 DIAGNOSIS — I110 Hypertensive heart disease with heart failure: Secondary | ICD-10-CM | POA: Diagnosis not present

## 2022-07-27 DIAGNOSIS — I10 Essential (primary) hypertension: Secondary | ICD-10-CM

## 2022-07-27 DIAGNOSIS — Z87891 Personal history of nicotine dependence: Secondary | ICD-10-CM | POA: Diagnosis not present

## 2022-07-27 LAB — COMPREHENSIVE METABOLIC PANEL
ALT: 39 U/L (ref 0–44)
AST: 34 U/L (ref 15–41)
Albumin: 3.6 g/dL (ref 3.5–5.0)
Alkaline Phosphatase: 57 U/L (ref 38–126)
Anion gap: 8 (ref 5–15)
BUN: 10 mg/dL (ref 8–23)
CO2: 26 mmol/L (ref 22–32)
Calcium: 8.9 mg/dL (ref 8.9–10.3)
Chloride: 103 mmol/L (ref 98–111)
Creatinine, Ser: 0.9 mg/dL (ref 0.61–1.24)
GFR, Estimated: >60 mL/min (ref >60)
Glucose, Bld: 115 mg/dL — ABNORMAL HIGH (ref 70–99)
Potassium: 4.4 mmol/L (ref 3.5–5.1)
Sodium: 137 mmol/L (ref 135–145)
Total Bilirubin: 0.3 mg/dL (ref 0.3–1.2)
Total Protein: 6.8 g/dL (ref 6.5–8.1)

## 2022-07-27 NOTE — Patient Instructions (Addendum)
It was great to see you today! No medication changes are needed at this time.   Labs today We will only contact you if something comes back abnormal or we need to make some changes. Otherwise no news is good news!  Your provider would like you to follow up with Dr. Shirlee Latch in 6 months. Please call our office in March 2024, if you have not heard from [place], to schedule your appointment.  At the [facility], you and your health needs are our priority. As part of our continuing mission to provide you with exceptional heart care, we have created designated Provider Care Teams. These Care Teams include your primary Cardiologist (physician) and Advanced Practice Providers (APPs- Physician Assistants and Nurse Practitioners) who all work together to provide you with the care you need, when you need it.   You may see any of the following providers on your designated Care Team at your next follow up: Dr. Arvilla Meres Dr. Marca Ancona Dr. Marcos Eke, NP Robbie Lis, PA Prince Rome, NP Anna Genre, [place] Brynda Peon, NP Karle Plumber, PharmD   Please be sure to bring in all your medications bottles to every appointment.

## 2022-08-26 ENCOUNTER — Encounter: Payer: Self-pay | Admitting: Internal Medicine

## 2022-08-26 ENCOUNTER — Ambulatory Visit: Payer: BC Managed Care – PPO | Attending: Internal Medicine | Admitting: Internal Medicine

## 2022-08-26 VITALS — BP 110/56 | HR 80 | Ht 68.0 in | Wt 157.6 lb

## 2022-08-26 DIAGNOSIS — I447 Left bundle-branch block, unspecified: Secondary | ICD-10-CM

## 2022-08-26 DIAGNOSIS — I428 Other cardiomyopathies: Secondary | ICD-10-CM | POA: Diagnosis not present

## 2022-08-26 DIAGNOSIS — Z95810 Presence of automatic (implantable) cardiac defibrillator: Secondary | ICD-10-CM

## 2022-08-26 NOTE — Progress Notes (Signed)
Patient Care Team: Deatra James, MD as PCP - General (Family Medicine)   HPI  [name] is a 61 y.o. male seen in follow-up for CRT-D implanted 1/23 for primary prevention in the setting of nonischemic cardiomyopathy  The patient denies chest pain, shortness of breath, nocturnal dyspnea, orthopnea or peripheral edema.  There have been no palpitations, lightheadedness or syncope.  +  Walks a flight of stairs 2 x day to take food to his cousin      DATE TEST EF    3/11 Echo   20-25 %    3/11 LHC   Non obstructive CAD  9/17 LHC 45% Non obstructive CAD  4/21 Echo  40-45%    5/22 Echo  25-35%    5/23 Echo  40-45%     Date Cr K Hgb  7/22 0.95 4.3 12.8 (12/20)  9/22  1.00 4.5    2/23 0.88 4.1 12.0  11/23 0.9 4.4      Past Medical History:  Diagnosis Date   CAD (coronary artery disease)    nonobstructive: LHC as above with mild disease   CHF (congestive heart failure) (HCC)    Chronic systolic heart failure (HCC)    History of echocardiogram    Echo 5/16:  EF 50-55%, diff HK, Gr 1 DD, mod MR, LA upper limits normal, mobile atrial septum (cannot r/p PFO)   HLD (hyperlipidemia)    Nonischemic cardiomyopathy (HCC)    LHC 3/11 showed EF 15%, 40% ostial CFX, 40% prox RCA, 40-50% mid RCa. Echo 3/11 moderate dilated LV, EF 20-25% global hk   Personal history of unspecified circulatory disease     Past Surgical History:  Procedure Laterality Date   BIV ICD INSERTION CRT-D N/A 09/02/2021   Procedure: BIV ICD INSERTION CRT-D;  Surgeon: Duke Salvia, MD;  Location: [facility] [place] INVASIVE CV LAB;  Service: Cardiovascular;  Laterality: N/A;   CARDIAC CATHETERIZATION     6 years ago   CARDIAC CATHETERIZATION N/A 05/26/2016   Procedure: Left Heart Cath and Coronary Angiography;  Surgeon: Laurey Morale, MD;  Location: [facility] INVASIVE CV LAB;  Service: Cardiovascular;  Laterality: N/A;    Current Meds  Medication Sig   aspirin EC (ASPIRIN 81) 81 MG tablet Take 1 tablet (81 mg  total) by mouth daily.   atorvastatin (LIPITOR) 20 MG tablet TAKE 1 TABLET DAILY   carvedilol (COREG) 25 MG tablet Take 1 tablet (25 mg total) by mouth 2 (two) times daily with a meal.   dapagliflozin propanediol (FARXIGA) 10 MG TABS tablet Take 1 tablet (10 mg total) by mouth daily before breakfast.   icosapent Ethyl (VASCEPA) 1 g capsule TAKE 2 CAPSULES TWICE A DAY   isosorbide-hydrALAZINE (BIDIL) 20-37.5 MG tablet Take 1 tablet by mouth 3 (three) times daily.   sacubitril-valsartan (ENTRESTO) 97-103 MG Take 1 tablet by mouth 2 (two) times daily.   spironolactone (ALDACTONE) 25 MG tablet TAKE 1 TABLET DAILY    Allergies  Allergen Reactions   Penicillins Rash    Has patient had a PCN reaction causing immediate rash, facial/tongue/throat swelling, SOB or lightheadedness with hypotension:UNSURE Has patient had a PCN reaction causing severe rash involving mucus membranes or skin necrosis:UNSURE Has patient had a PCN reaction that required hospitalization:YES Has patient had a PCN reaction occurring within the last 10 years:NO If all of the above answers are "NO", then may proceed with Cephalosporin use.      Review of Systems negative except from  HPI and PMH  Physical Exam BP (!) 110/56   Pulse 80   Ht 5\' 8"  (1.727 m)   Wt 157 lb 9.6 oz (71.5 kg)   SpO2 99%   BMI 23.96 kg/m  Well developed and well nourished in no acute distress HENT normal Neck supple with JVP-flat Clear Device pocket well healed; without hematoma or erythema.  There is no tethering  Regular rate and rhythm, no  gallop No  murmur Abd-soft with active BS No Clubbing cyanosis  edema Skin-warm and dry A & Oriented  Grossly normal sensory and motor function  ECG sinus P-synchronous/ AV  pacing  08/18/39 QRS negative lead V1 and fractionated upright in lead I  Device function is normal. Programming changes   See Paceart for details    ECG P synchronous pacing at 74  13/12/41 Negative QRS lead V1 and  positive QRS lead I  Reconfigured pacing configuration D-M2 associate with a QRSd of 126 ms with a RSR prime in lead I  Preimplant QRSd 156 ms  CrCl cannot be calculated (Patient's most recent lab result is older than the maximum 21 days allowed.).   Assessment and  Plan Nonischemic cardiomyopathy  Congestive heart failure class II  Left bundle branch block-atypical  ICD-CRT-Saint Jude  Anemia-chronic    Functional status is outstanding.  Continue Aldactone and Entresto BiDil and carvedilol per Dr. 15/12/41  Cardiac resynchronization is interesting, QRS duration is 120 ms down from 160 ms not withstanding the fact that the electrocardiogram does not show right bundle.

## 2022-08-26 NOTE — Patient Instructions (Signed)
Medication Instructions:  Your physician recommends that you continue on your current medications as directed. Please refer to the Current Medication list given to you today.  *If you need a refill on your cardiac medications before your next appointment, please call your pharmacy*   Lab Work: None ordered.  If you have labs (blood work) drawn today and your tests are completely normal, you will receive your results only by: MyChart Message (if you have MyChart) OR A paper copy in the mail If you have any lab test that is abnormal or we need to change your treatment, we will call you to review the results.   Testing/Procedures: None ordered.    Follow-Up: At [facility], you and your health needs are our priority.  As part of our continuing mission to provide you with exceptional heart care, we have created designated Provider Care Teams.  These Care Teams include your primary Cardiologist (physician) and Advanced Practice Providers (APPs -  Physician Assistants and Nurse Practitioners) who all work together to provide you with the care you need, when you need it.  We recommend signing up for the patient portal called "MyChart".  Sign up information is provided on this After Visit Summary.  MyChart is used to connect with patients for Virtual Visits (Telemedicine).  Patients are able to view lab/test results, encounter notes, upcoming appointments, etc.  Non-urgent messages can be sent to your provider as well.   To learn more about what you can do with MyChart, go to [email or web address].    Your next appointment:   12 months with Dr Klein  Important Information About Sugar       

## 2022-08-31 NOTE — Addendum Note (Signed)
Addended by: Kendell Sagraves F on: 08/31/2022 12:52 PM   Modules accepted: Orders  

## 2022-09-02 ENCOUNTER — Ambulatory Visit (INDEPENDENT_AMBULATORY_CARE_PROVIDER_SITE_OTHER): Payer: BC Managed Care – PPO

## 2022-09-02 DIAGNOSIS — I428 Other cardiomyopathies: Secondary | ICD-10-CM

## 2022-09-02 LAB — CUP PACEART REMOTE DEVICE CHECK
Battery Remaining Longevity: 73 mo
Battery Remaining Percentage: 83 %
Battery Voltage: 3.02 V
Brady Statistic AP VP Percent: 1 %
Brady Statistic AP VS Percent: 1 %
Brady Statistic AS VP Percent: 99 %
Brady Statistic AS VS Percent: 1 %
Brady Statistic RA Percent Paced: 1 %
Date Time Interrogation Session: 20231228040026
HighPow Impedance: 71 Ohm
HighPow Impedance: 71 Ohm
Implantable Lead Connection Status: 753985
Implantable Lead Connection Status: 753985
Implantable Lead Connection Status: 753985
Implantable Lead Implant Date: 20221228
Implantable Lead Implant Date: 20221228
Implantable Lead Implant Date: 20221228
Implantable Lead Location: 753858
Implantable Lead Location: 753859
Implantable Lead Location: 753860
Implantable Lead Model: 7122
Implantable Pulse Generator Implant Date: 20221228
Lead Channel Impedance Value: 460 Ohm
Lead Channel Impedance Value: 660 Ohm
Lead Channel Impedance Value: 980 Ohm
Lead Channel Pacing Threshold Amplitude: 0.5 V
Lead Channel Pacing Threshold Amplitude: 0.5 V
Lead Channel Pacing Threshold Amplitude: 1 V
Lead Channel Pacing Threshold Pulse Width: 0.5 ms
Lead Channel Pacing Threshold Pulse Width: 0.5 ms
Lead Channel Pacing Threshold Pulse Width: 0.8 ms
Lead Channel Sensing Intrinsic Amplitude: 11.4 mV
Lead Channel Sensing Intrinsic Amplitude: 2.6 mV
Lead Channel Setting Pacing Amplitude: 1.75 V
Lead Channel Setting Pacing Amplitude: 1.75 V
Lead Channel Setting Pacing Amplitude: 2 V
Lead Channel Setting Pacing Pulse Width: 0.5 ms
Lead Channel Setting Pacing Pulse Width: 0.8 ms
Lead Channel Setting Sensing Sensitivity: 0.5 mV
Pulse Gen Serial Number: 8926485
Zone Setting Status: 755011

## 2022-09-20 NOTE — Progress Notes (Signed)
Remote ICD transmission.   

## 2022-09-30 IMAGING — DX DG CHEST 1V PORT
1 series · 1 of 1 positions shown · non-contrast
Comparison: None.

CLINICAL DATA: COVID, cough

EXAM:
PORTABLE CHEST 1 VIEW

[chest ap]
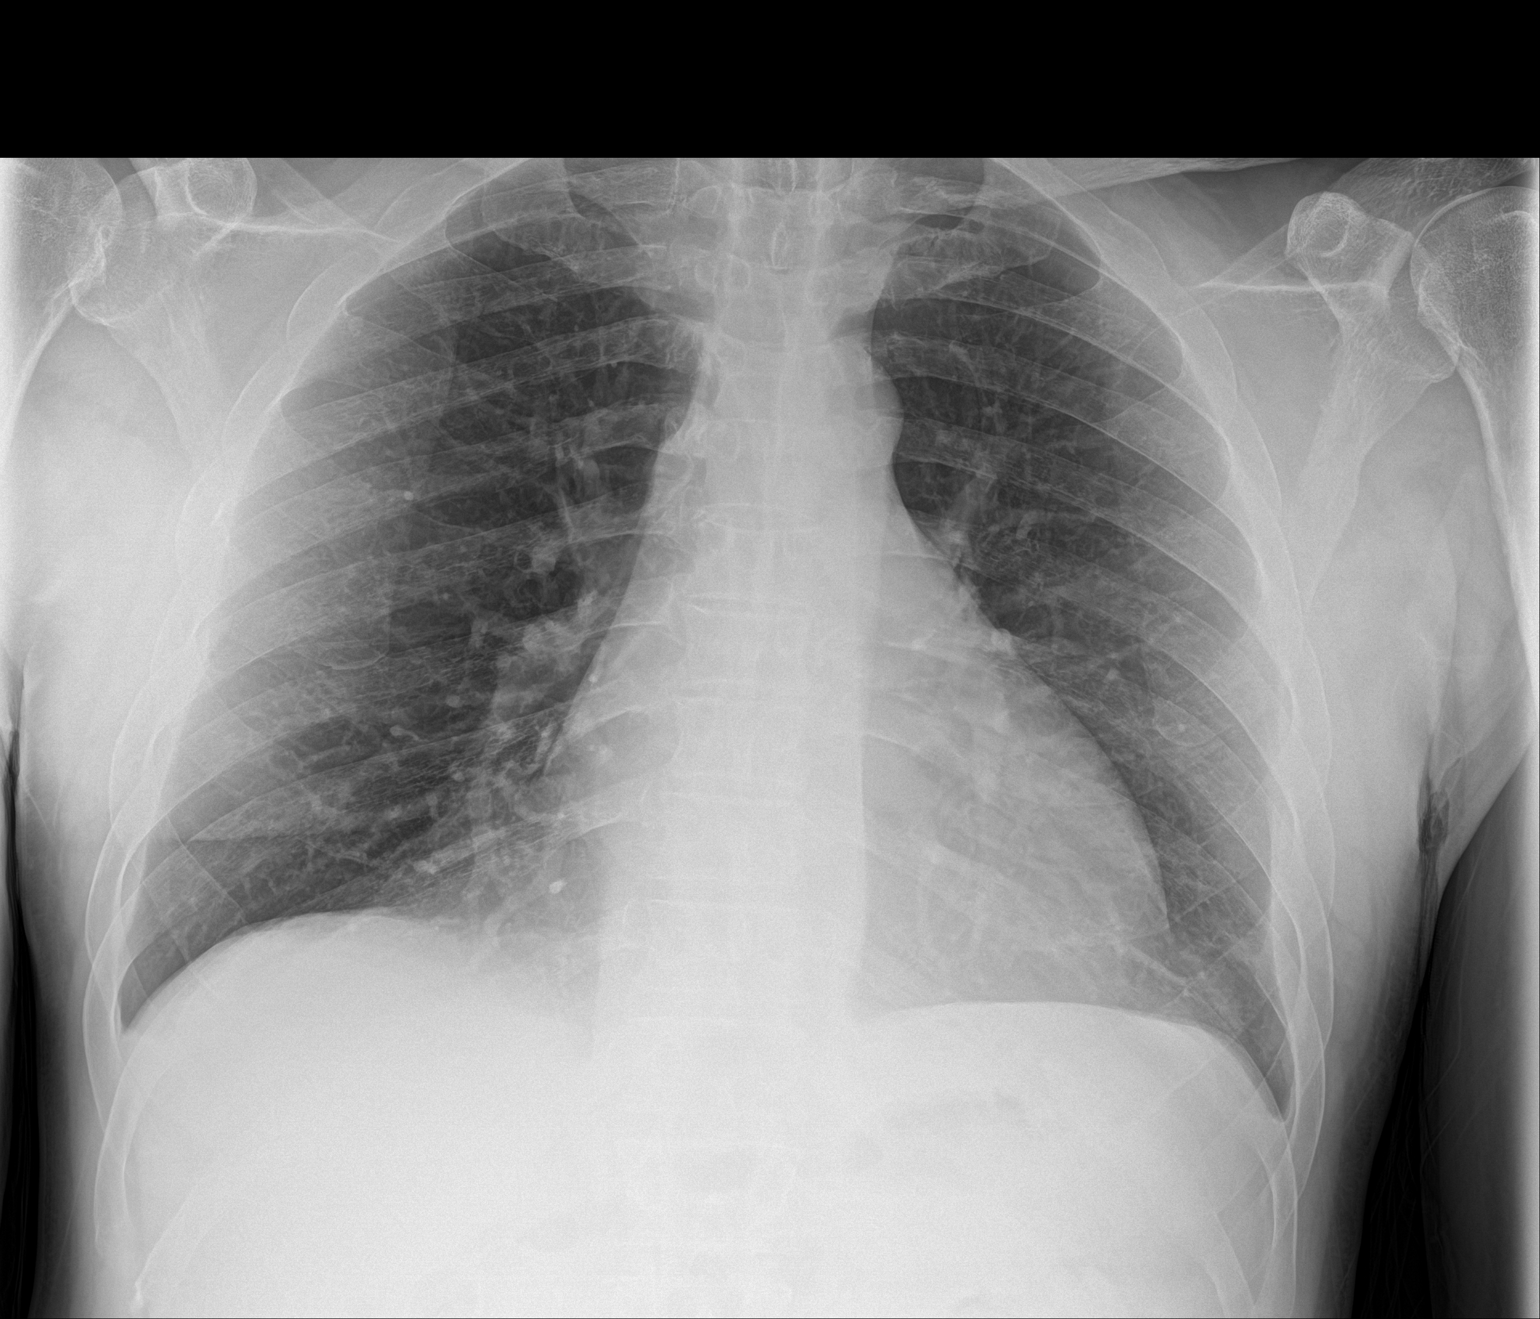

[1 of 1 positions shown; findings below may reference images not displayed]

FINDINGS: Heart and mediastinal contours are within normal limits. No focal
opacities or effusions. No acute bony abnormality.
IMPRESSION: No active disease.

## 2022-12-02 ENCOUNTER — Ambulatory Visit (INDEPENDENT_AMBULATORY_CARE_PROVIDER_SITE_OTHER): Payer: BC Managed Care – PPO

## 2022-12-02 DIAGNOSIS — I428 Other cardiomyopathies: Secondary | ICD-10-CM

## 2022-12-03 LAB — CUP PACEART REMOTE DEVICE CHECK
Battery Remaining Longevity: 70 mo
Battery Remaining Percentage: 80 %
Battery Voltage: 3.01 V
Brady Statistic AP VP Percent: 1 %
Brady Statistic AP VS Percent: 1 %
Brady Statistic AS VP Percent: 99 %
Brady Statistic AS VS Percent: 1 %
Brady Statistic RA Percent Paced: 1 %
Date Time Interrogation Session: 20240328181938
HighPow Impedance: 60 Ohm
HighPow Impedance: 60 Ohm
Implantable Lead Connection Status: 753985
Implantable Lead Connection Status: 753985
Implantable Lead Connection Status: 753985
Implantable Lead Implant Date: 20221228
Implantable Lead Implant Date: 20221228
Implantable Lead Implant Date: 20221228
Implantable Lead Location: 753858
Implantable Lead Location: 753859
Implantable Lead Location: 753860
Implantable Lead Model: 7122
Implantable Pulse Generator Implant Date: 20221228
Lead Channel Impedance Value: 450 Ohm
Lead Channel Impedance Value: 550 Ohm
Lead Channel Impedance Value: 990 Ohm
Lead Channel Pacing Threshold Amplitude: 0.5 V
Lead Channel Pacing Threshold Amplitude: 0.5 V
Lead Channel Pacing Threshold Amplitude: 1 V
Lead Channel Pacing Threshold Pulse Width: 0.5 ms
Lead Channel Pacing Threshold Pulse Width: 0.5 ms
Lead Channel Pacing Threshold Pulse Width: 0.8 ms
Lead Channel Sensing Intrinsic Amplitude: 12 mV
Lead Channel Sensing Intrinsic Amplitude: 2.8 mV
Lead Channel Setting Pacing Amplitude: 1.75 V
Lead Channel Setting Pacing Amplitude: 1.75 V
Lead Channel Setting Pacing Amplitude: 2 V
Lead Channel Setting Pacing Pulse Width: 0.5 ms
Lead Channel Setting Pacing Pulse Width: 0.8 ms
Lead Channel Setting Sensing Sensitivity: 0.5 mV
Pulse Gen Serial Number: 8926485
Zone Setting Status: 755011

## 2022-12-23 ENCOUNTER — Other Ambulatory Visit (HOSPITAL_COMMUNITY): Payer: Self-pay

## 2022-12-23 ENCOUNTER — Telehealth (HOSPITAL_COMMUNITY): Payer: Self-pay

## 2022-12-23 MED ORDER — DAPAGLIFLOZIN PROPANEDIOL 10 MG PO TABS
10.0000 mg | ORAL_TABLET | Freq: Every day | ORAL | 3 refills | Status: DC
Start: 2022-12-23 — End: 2023-11-23
  Filled 2022-12-23 (×3): qty 30, 30d supply, fill #0

## 2022-12-23 NOTE — Telephone Encounter (Signed)
Patient Advocate Encounter  Patient has an unmet deductible that is causing 30 days of Farxiga to exceed $300 costs after copay savings card. Informed patient and offered a 1 month supply of samples to patient. Patient expressed understanding.  Medication Samples have been left at registration desk for patient pick up. Drug name: Marcelline Deist 10MG  Qty: 4x 7 ct packages LOT: [id number] Exp.: 07/06/2025 SIG: Take 1 tablet by mouth once daily   The patient has been instructed regarding the correct time, dose, and frequency of taking this medication, including desired effects and most common side effects.   Burnell Blanks, CPhT Rx Patient Advocate Phone: [phone]

## 2022-12-23 NOTE — Telephone Encounter (Signed)
Patient states he needs assistance with getting [place]. He cannot afford the amount.  Thanks!

## 2023-01-05 NOTE — Progress Notes (Signed)
Remote ICD transmission.   

## 2023-02-14 ENCOUNTER — Other Ambulatory Visit (HOSPITAL_COMMUNITY): Payer: Self-pay | Admitting: Cardiology

## 2023-02-14 DIAGNOSIS — I5022 Chronic systolic (congestive) heart failure: Secondary | ICD-10-CM

## 2023-03-03 ENCOUNTER — Ambulatory Visit (INDEPENDENT_AMBULATORY_CARE_PROVIDER_SITE_OTHER): Payer: BC Managed Care – PPO

## 2023-03-03 DIAGNOSIS — I428 Other cardiomyopathies: Secondary | ICD-10-CM

## 2023-03-03 LAB — CUP PACEART REMOTE DEVICE CHECK
Battery Remaining Longevity: 68 mo
Battery Remaining Percentage: 77 %
Battery Voltage: 3.01 V
Brady Statistic AP VP Percent: 1 %
Brady Statistic AP VS Percent: 1 %
Brady Statistic AS VP Percent: 99 %
Brady Statistic AS VS Percent: 1 %
Brady Statistic RA Percent Paced: 1 %
Date Time Interrogation Session: 20240627040018
HighPow Impedance: 69 Ohm
HighPow Impedance: 69 Ohm
Implantable Lead Connection Status: 753985
Implantable Lead Connection Status: 753985
Implantable Lead Connection Status: 753985
Implantable Lead Implant Date: 20221228
Implantable Lead Implant Date: 20221228
Implantable Lead Implant Date: 20221228
Implantable Lead Location: 753858
Implantable Lead Location: 753859
Implantable Lead Location: 753860
Implantable Lead Model: 7122
Implantable Pulse Generator Implant Date: 20221228
Lead Channel Impedance Value: 1000 Ohm
Lead Channel Impedance Value: 480 Ohm
Lead Channel Impedance Value: 610 Ohm
Lead Channel Pacing Threshold Amplitude: 0.5 V
Lead Channel Pacing Threshold Amplitude: 0.5 V
Lead Channel Pacing Threshold Amplitude: 1 V
Lead Channel Pacing Threshold Pulse Width: 0.5 ms
Lead Channel Pacing Threshold Pulse Width: 0.5 ms
Lead Channel Pacing Threshold Pulse Width: 0.8 ms
Lead Channel Sensing Intrinsic Amplitude: 12 mV
Lead Channel Sensing Intrinsic Amplitude: 3.4 mV
Lead Channel Setting Pacing Amplitude: 1.75 V
Lead Channel Setting Pacing Amplitude: 1.75 V
Lead Channel Setting Pacing Amplitude: 2 V
Lead Channel Setting Pacing Pulse Width: 0.5 ms
Lead Channel Setting Pacing Pulse Width: 0.8 ms
Lead Channel Setting Sensing Sensitivity: 0.5 mV
Pulse Gen Serial Number: 8926485
Zone Setting Status: 755011

## 2023-03-22 NOTE — Progress Notes (Signed)
 Remote ICD transmission.   

## 2023-04-03 IMAGING — DX DG CHEST 2V
2 series · 2 of 2 positions shown · non-contrast
Comparison: 03/01/2021

CLINICAL DATA: Postop

EXAM:
CHEST - 2 VIEW

[w chest pa]
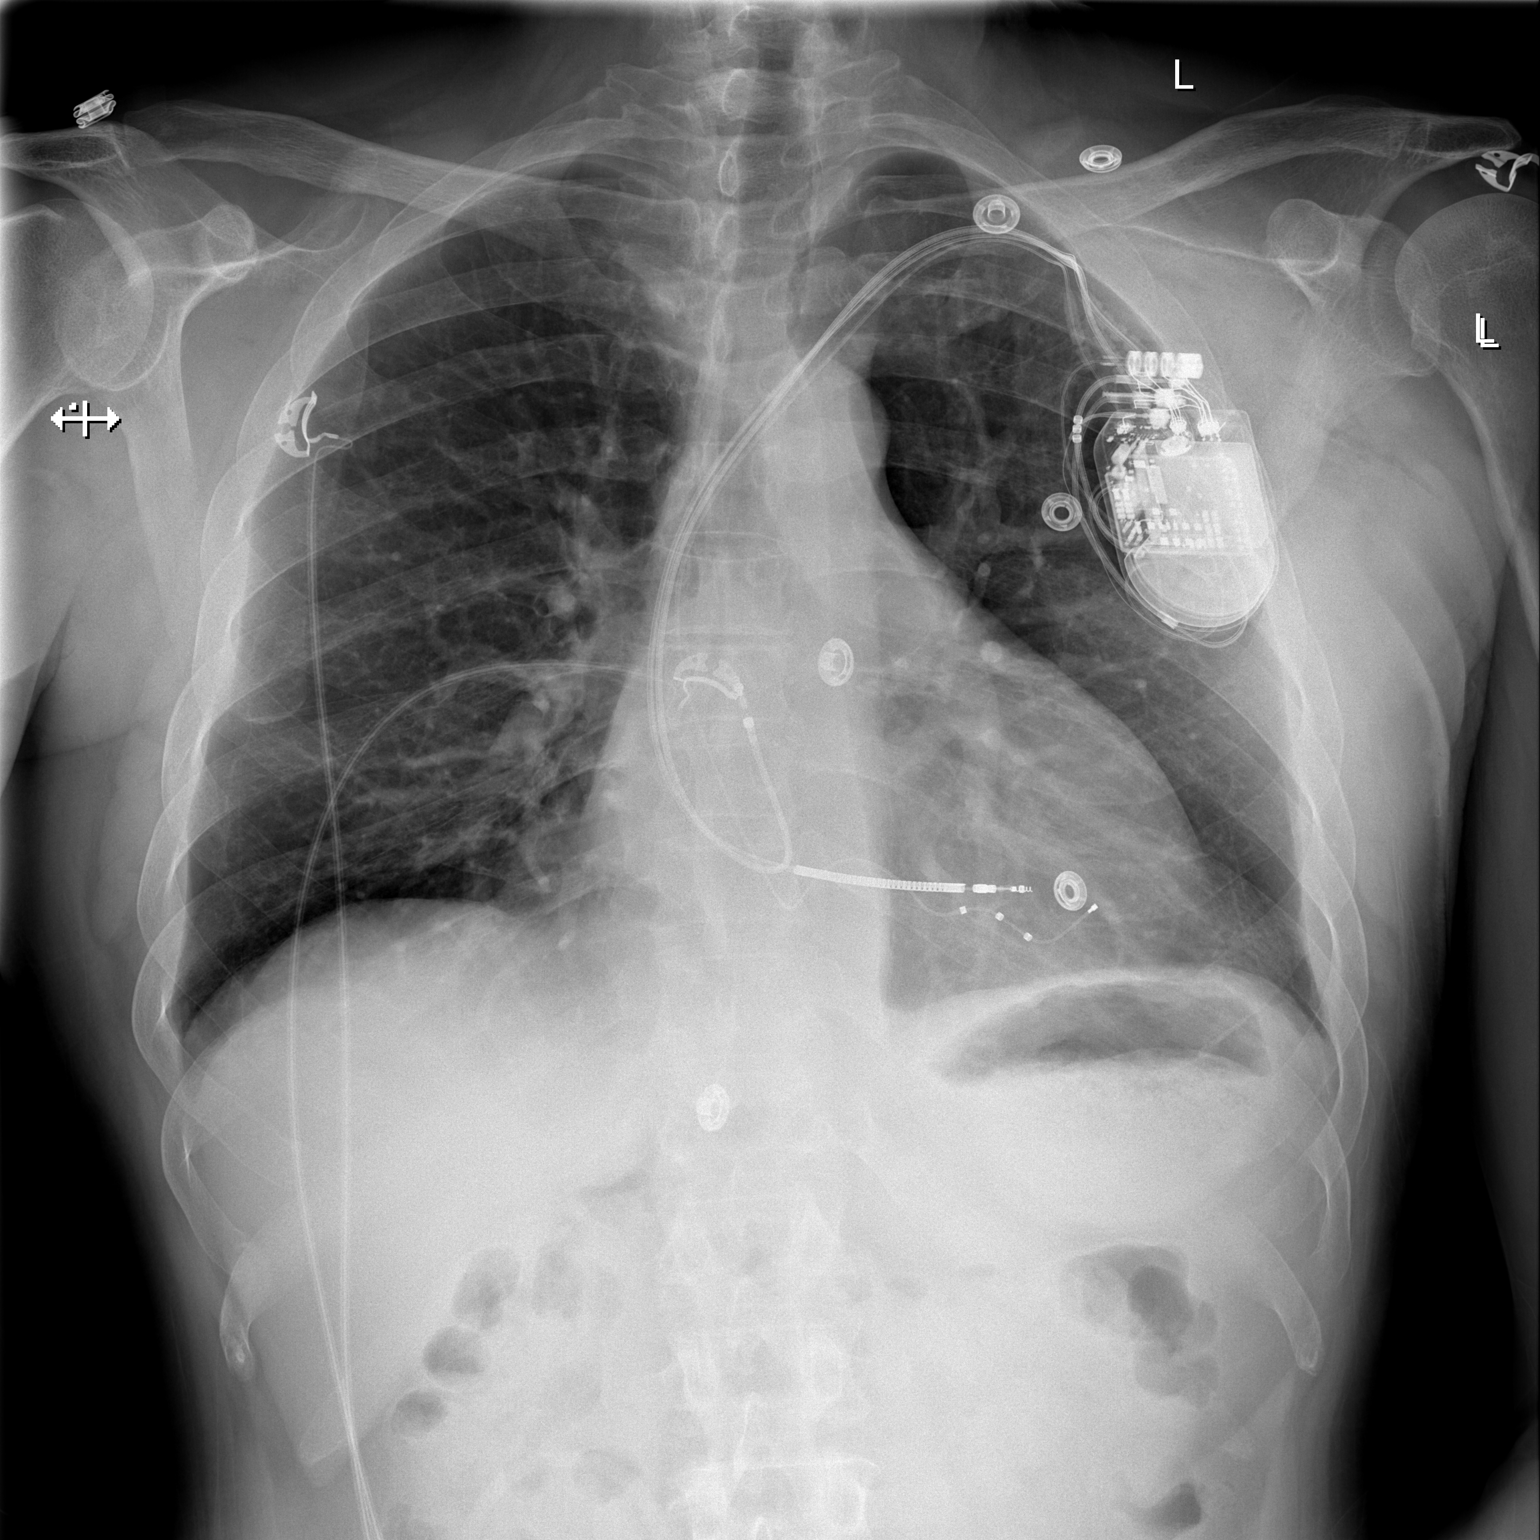

[w chest lat]
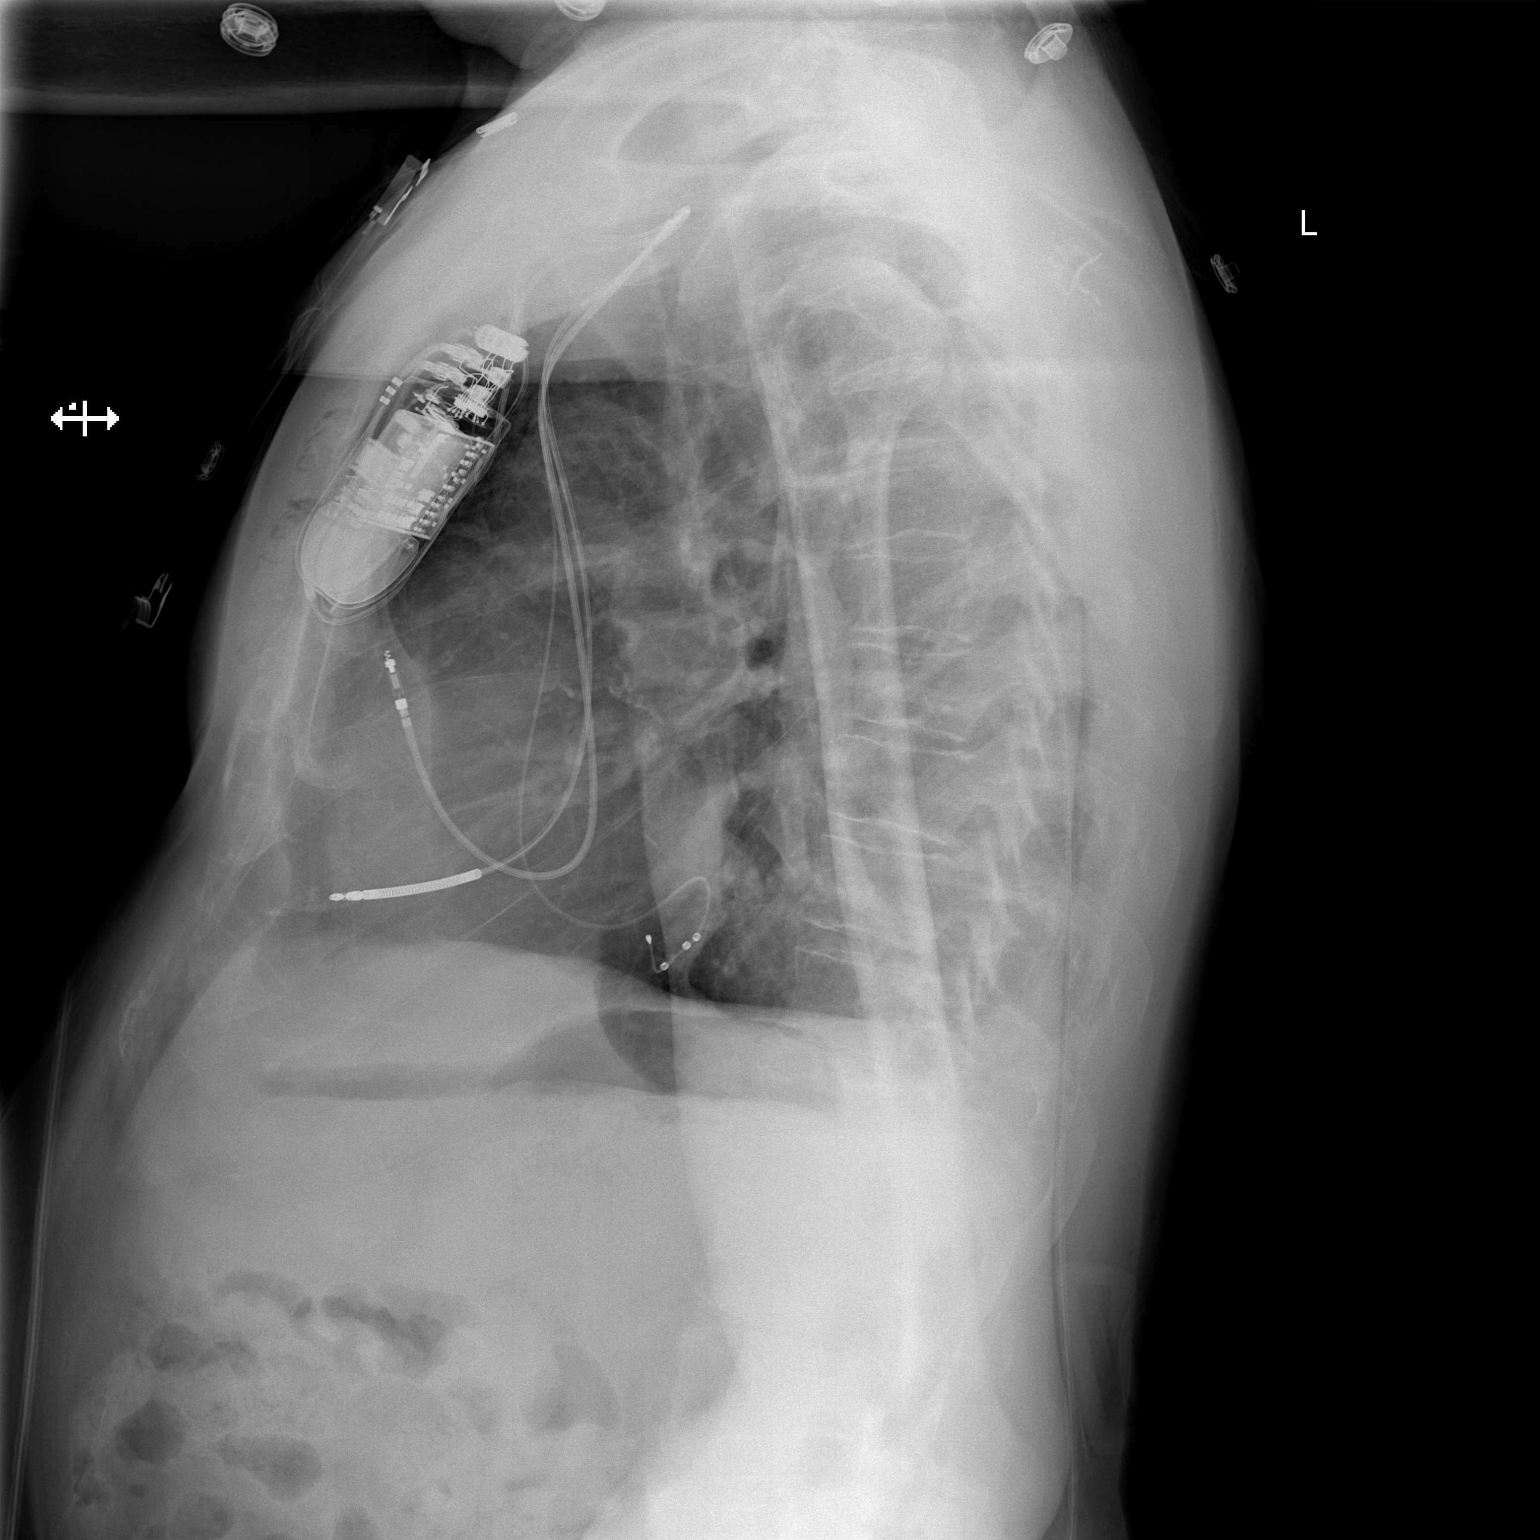

[2 of 2 positions shown; findings below may reference images not displayed]

FINDINGS: Interim placement of left-sided multi lead pacing device with right
atrial and biventricular leads. No pneumothorax is seen. No pleural
effusion or focal airspace disease. Cardiac size within normal
limits.
IMPRESSION: Interim placement of left-sided pacing device as above without
visible pneumothorax.

## 2023-04-05 ENCOUNTER — Encounter (HOSPITAL_COMMUNITY): Payer: Self-pay | Admitting: Cardiology

## 2023-04-05 ENCOUNTER — Ambulatory Visit (HOSPITAL_COMMUNITY)
Admission: RE | Admit: 2023-04-05 | Discharge: 2023-04-05 | Disposition: A | Discharge status: Home / Self Care | Payer: BC Managed Care – PPO | Source: Ambulatory Visit | Attending: Cardiology | Admitting: Cardiology

## 2023-04-05 VITALS — BP 110/70 | HR 71 | Wt 156.8 lb

## 2023-04-05 DIAGNOSIS — R9431 Abnormal electrocardiogram [ECG] [EKG]: Secondary | ICD-10-CM | POA: Insufficient documentation

## 2023-04-05 DIAGNOSIS — E785 Hyperlipidemia, unspecified: Secondary | ICD-10-CM | POA: Diagnosis not present

## 2023-04-05 DIAGNOSIS — I428 Other cardiomyopathies: Secondary | ICD-10-CM | POA: Diagnosis not present

## 2023-04-05 DIAGNOSIS — Z95810 Presence of automatic (implantable) cardiac defibrillator: Secondary | ICD-10-CM | POA: Diagnosis not present

## 2023-04-05 DIAGNOSIS — Z79899 Other long term (current) drug therapy: Secondary | ICD-10-CM | POA: Diagnosis not present

## 2023-04-05 DIAGNOSIS — I5022 Chronic systolic (congestive) heart failure: Secondary | ICD-10-CM | POA: Diagnosis not present

## 2023-04-05 DIAGNOSIS — Z7982 Long term (current) use of aspirin: Secondary | ICD-10-CM | POA: Diagnosis not present

## 2023-04-05 DIAGNOSIS — I110 Hypertensive heart disease with heart failure: Secondary | ICD-10-CM | POA: Diagnosis not present

## 2023-04-05 DIAGNOSIS — Z87891 Personal history of nicotine dependence: Secondary | ICD-10-CM | POA: Diagnosis not present

## 2023-04-05 DIAGNOSIS — I2510 Atherosclerotic heart disease of native coronary artery without angina pectoris: Secondary | ICD-10-CM | POA: Diagnosis not present

## 2023-04-05 LAB — LIPID PANEL
Cholesterol: 209 mg/dL — ABNORMAL HIGH (ref 0–200)
HDL: 95 mg/dL (ref >40)
LDL Cholesterol: 98 mg/dL (ref 0–99)
Total CHOL/HDL Ratio: 2.2 RATIO
Triglycerides: 80 mg/dL (ref <150)
VLDL: 16 mg/dL (ref 0–40)

## 2023-04-05 LAB — BASIC METABOLIC PANEL
Anion gap: 10 (ref 5–15)
BUN: 8 mg/dL (ref 8–23)
CO2: 22 mmol/L (ref 22–32)
Calcium: 8.8 mg/dL — ABNORMAL LOW (ref 8.9–10.3)
Chloride: 105 mmol/L (ref 98–111)
Creatinine, Ser: 0.91 mg/dL (ref 0.61–1.24)
GFR, Estimated: >60 mL/min (ref >60)
Glucose, Bld: 102 mg/dL — ABNORMAL HIGH (ref 70–99)
Potassium: 3.7 mmol/L (ref 3.5–5.1)
Sodium: 137 mmol/L (ref 135–145)

## 2023-04-05 NOTE — Patient Instructions (Signed)
There has been no changes to your medications.  Labs done today, your results will be available in MyChart, we will contact you for abnormal readings.  Repeat blood work in 3 months.  Your physician has requested that you have an echocardiogram. Echocardiography is a painless test that uses sound waves to create images of your heart. It provides your doctor with information about the size and shape of your heart and how well your heart's chambers and valves are working. This procedure takes approximately one hour. There are no restrictions for this procedure. Please do NOT wear cologne, perfume, aftershave, or lotions (deodorant is allowed). Please arrive 15 minutes prior to your appointment time.  Your physician recommends that you schedule a follow-up appointment in: 6 months ( January 2025) ** PLEASE CALL THE OFFICE IN NOVEMBER TO ARRANGE YOUR FOLLOW UP APPOINTMENT. **  If you have any questions or concerns before your next appointment please send [place] a message through [place] or call our office at [phone].    TO LEAVE A MESSAGE FOR THE NURSE SELECT OPTION 2, PLEASE LEAVE A MESSAGE INCLUDING: YOUR NAME DATE OF BIRTH CALL BACK NUMBER REASON FOR CALL**this is important as we prioritize the call backs  YOU WILL RECEIVE A CALL BACK THE SAME DAY AS LONG AS YOU CALL BEFORE 4:00 PM  At the [facility], you and your health needs are our priority. As part of our continuing mission to provide you with exceptional heart care, we have created designated Provider Care Teams. These Care Teams include your primary Cardiologist (physician) and Advanced Practice Providers (APPs- Physician Assistants and Nurse Practitioners) who all work together to provide you with the care you need, when you need it.   You may see any of the following providers on your designated Care Team at your next follow up: Dr Arvilla Meres Dr Marca Ancona Dr. Marcos Eke, NP Robbie Lis, [place] [facility] [place], [place] Brynda Peon, NP Karle Plumber, PharmD   Please be sure to bring in all your medications bottles to every appointment.    Thank you for choosing [facility]

## 2023-04-05 NOTE — Progress Notes (Signed)
Patient ID: [name], male   DOB: Jan 23, 1961, 62 y.o.   MRN: [id number] PCP: Dr. Wynelle Link Cardiology: Dr. Shirlee Latch  62 y.o. returns for followup of nonischemic cardiomyopathy.  Patient presented to [facility] in 3/11 after 2-3 weeks of shortness of breath.  He had a flu-like illness with congestion and myalgias and had been treated with clarithromycin as an outpatient.  However, he became progressively short of breath so went into the hospital.  He was noted to have BNP 976 and pulmonary edema on CXR.  Echo showed EF 20-25%.  Left heart cath was done showing mild coronary disease, suggesting that this was likely a nonischemic cardiomyopathy.  Patient was diuresed and begun on cardiac meds.  Cardiac MRI showed EF 20% without significant delayed enhancement, so no evidence for infiltrative disease.  Echo was repeated 9/11, showing some improvement.  Would estimate EF at 40% with diffuse hypokinesis.  Echo in 4/14 showed EF 55% with septal hypokinesis.  5/16 echo also showed EF 50-55% range.   Echo in 7/17 showed EF back down to 30-35%.  He had not been taking his medications regularly (thinks he was missing about 50% of doses).  Cardiolite done in 8/17, showing EF 36% and possible inferior MI.  Repeat cardiac cath in 9/17 after abnormal Cardiolite showed diffuse but nonobstructive CAD.  Repeat echo in 2/18 showed EF back up to 50%.    Echo in 1/20 showed EF 40-45% with normal RV size and systolic function. He was started back on Bidil.  Echo in 4/21 showed EF 40-45%, mild diffuse hypokinesis, normal RV.  Echo in 5/22 showed EF 25-30%, normal RV.  He saw Dr. Graciela Husbands to discuss CRT-D given LBBB-like IVCD => now with [facility] Jude CRT-D device.   Echo 5/23 showed EF 40-45% with normal RV (improvement).   Today he returns for HF follow up. 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  He denies exertional dyspnea or chest pain.  He mows his grass and does yardwork with no issues.  No chest pain.  No  orthopnea/PND.   St Jude device interrogation (personally reviewed): Thoracic impedance stable, >99% BiV pacing, no VT/AF.   ECG (personally reviewed): NSR, Bi-V pacing  Labs (3/11): UPEP/SPEP negative, ANA negative, urine drug screen negative, HIV negative, TSH normal, LDL 158, HDL 106, TGs 200, BNP 976, ESR 3, K 4, creatinine 1.01 Labs (4/11): BNP 212, K 4.2, creatinine 0.97 Labs (7/11): BNP 81, K 4.1, creatinine 0.9 Labs (10/11): K 4.2, creatinine 0.8, LDL 98, HDL 68 Labs (1/12): K 3.8, creatinine 0.9, BNP 70 Labs (8/12): K 3.8, creatinine 0.9, HDL 90, LDL 48 Labs (3/14): K 3.5, creatinine 0.9 Labs (5/16): LDL 85 Labs (12/16): K 3.9, creatinine 0.86 Labs (7/17): LDL 55, HDL 146, BNP 24, K 3.7, creatinine 0.7 Labs (8/17): K 3.3, creatinine 0.83 Labs (9/17): K 3.9, creatinine 0.85 Labs (10/17): K 3.8, creatinine 1.05 Labs (5/18): K 3.6, creatinine 0.91 Labs (8/18): creatinine 1.12 Labs (12/18): LDL 68 Labs (12/20): BNP 42, K 4, creatinine 1.04, LDL 75, HDL 76 Labs (4/21): K 3.8,creatinine 1.02 Labs (7/21): K 3.9, creatinine 0.86 Labs (4/22): LDL 65 Labs (7/22): K 4.3, creatinine 0.95 Labs (2/23): K 4.1, creatinine 0.88 Labs (5/23): LDL 78, HDL 81, TGs 168 Labs (8/23): K 3.7, creatinine 1.21 Labs (11/23): K 4.4, creatinine 0.9  Allergies:  1)  ! Pcn  Past Medical History: 1. Nonischemic Cardiomyopathy: LHC (3/11) showed EF 15%, 40% ostial CFX, 40% prox RCA, 40-50% mid  RCA.  Echo (3/11): Moderately dilated LV, EF 20-25%, global HK, severe diastolic dysfunction, mild RV dilation and dysfunction, moderate MR. SPEP/UPEP negative, ANA negative, HIV negative, TSH normal, no drug history.  Moderate ETOH use.  Highest suspicion probably viral myocarditis.   Cardiac MRI (4/11) showed a moderately dilated LV with global systolic dysfunction, EF 20%, normal RV size with moderate systolic dysfunction, small area of nonspecific subepicardial basal inferoseptal (RV insertion site) delayed  enhancement.  Repeat echo (9/11): EF 40% with mild to moderate diffuse hypokinesis.  Echo (4/14) with EF 55%, septal hypokinesis, mild MR.  - Echo (5/16): EF 50-55%, moderate MR. - Echo (7/17): EF 30-35%, mild MR.   - LHC (9/17) with nonobstructive CAD.  - Echo (2/18): EF 50%, diffuse hypokinesis, normal RV size and systolic function.  - Echo (1/20): EF 40-45%, normal RV size and systolic function.  - Echo (4/21): EF 40-45%, normal RV  - Echo (5/22): EF 25-30%, normal RV. - St Jude CRT-D device - Echo (5/23): EF 40-45% with normal RV (improvement).  2. Nonobstructive CAD:  - LHC (3/11) showed EF 15%, 40% ostial CFX, 40% prox RCA, 40-50% mid RCA.  - Cardiolite (8/17) with EF 36%, possible inferior MI.  - LHC (9/17) with EF 45%, 50% ostial stenosis small AV LCx, diffuse RCA disease, 40% proximal/50% mid.  3. Smoking 4. Hyperlipidemia  Family History: The patient is adopted so does not know his family history.   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  smoked half pack per day for over 30 years, quit in 3/21.  Alcohol Use - yes, 2-3 drinks about 3-4 times a week in the past, has now cut back. Married, has two children.    ROS: All systems reviewed and negative except as per HPI.   Current Outpatient Medications  Medication Sig Dispense Refill   aspirin EC (ASPIRIN 81) 81 MG tablet Take 1 tablet (81 mg total) by mouth daily. 90 tablet 3   atorvastatin (LIPITOR) 20 MG tablet TAKE 1 TABLET DAILY 90 tablet 3   carvedilol (COREG) 25 MG tablet Take 1 tablet (25 mg total) by mouth 2 (two) times daily with a meal. NEEDS FOLLOW UP APPOINTMENT FOR MORE REFILLS 180 tablet 0   dapagliflozin propanediol (FARXIGA) 10 MG TABS tablet Take 1 tablet (10 mg total) by mouth daily before breakfast. 30 tablet 3   icosapent Ethyl (VASCEPA) 1 g capsule TAKE 2 CAPSULES TWICE A DAY 336 capsule 3   isosorbide-hydrALAZINE (BIDIL) 20-37.5 MG tablet Take 1 tablet by  mouth 3 (three) times daily. 18 tablet 0   sacubitril-valsartan (ENTRESTO) 97-103 MG Take 1 tablet by mouth 2 (two) times daily. 60 tablet 3   spironolactone (ALDACTONE) 25 MG tablet TAKE 1 TABLET DAILY 90 tablet 3   No current facility-administered medications for this encounter.   Wt Readings from Last 3 Encounters:  04/05/23 71.1 kg (156 lb 12.8 oz)  08/26/22 71.5 kg (157 lb 9.6 oz)  07/27/22 70.3 kg (155 lb)   BP 110/70   Pulse 71   Wt 71.1 kg (156 lb 12.8 oz)   SpO2 100%   BMI 23.84 kg/m  General: NAD Neck: No JVD, no thyromegaly or thyroid nodule.  Lungs: Clear to auscultation bilaterally with normal respiratory effort. CV: Nondisplaced PMI.  Heart regular S1/S2, no S3/S4, no murmur.  No peripheral edema.  No carotid bruit.  Normal pedal pulses.  Abdomen: Soft, nontender, no hepatosplenomegaly, no distention.  Skin: Intact  without lesions or rashes.  Neurologic: Alert and oriented x 3.  Psych: Normal affect. Extremities: No clubbing or cyanosis.  HEENT: Normal.   Assessment/Plan: 1. Chronic systolic CHF: History of nonischemic cardiomyopathy, had recovered to near normal on 2016 echo but EF down to 30-35% on 7/17 echo.  Repeat LHC in 9/17 showed diffuse but nonobstructive CAD.  EF was up to 50% on 2/18 echo, but was down some on 1/20 echo to 40-45% and remained 40-45% on 4/21 echo. In 5/22 despite good compliance with GDMT, echo showed EF 25-30%. St Jude CRT-D placed.  Echo 5/23 showed improvement with EF up to 40-45%.   Not volume overloaded by exam or Corvue, NYHA class I.   - Continue Coreg 25 mg bid. - Continue spironolactone 25 mg daily. BMET today. - Continue Bidil 1 tab tid. He has not tolerated increase in the past due to lightheadedness.  - Continue Entresto 97/103 mg bid. - Continue Farxiga 10 mg daily.  - ETOH cut back to minimal.  - I will arrange for echocardiogram.  2. HTN: BP controlled.    3. Hyperlipidemia: On statin and Vascepa.     - Check lipids today.   4. Smoking: He has now quit.  5. CAD: Diffuse nonobstructive disease on 9/17 cath. Continue ASA 81 and atorvastatin with goal LDL < 70.   BMET in 3 months, followup with APP in 6 months.   Marca Ancona  04/05/2023

## 2023-04-07 ENCOUNTER — Telehealth (HOSPITAL_COMMUNITY): Payer: Self-pay

## 2023-04-07 DIAGNOSIS — I5022 Chronic systolic (congestive) heart failure: Secondary | ICD-10-CM

## 2023-04-07 MED ORDER — ATORVASTATIN CALCIUM 40 MG PO TABS
40.0000 mg | ORAL_TABLET | Freq: Every day | ORAL | 3 refills | Status: DC
Start: 2023-04-07 — End: 2024-07-30

## 2023-04-07 NOTE — Telephone Encounter (Addendum)
Pt aware, agreeable, and verbalized understanding  Rx sent in.   Medication Samples have been provided to the patient.  Drug name: Marcelline Deist       Strength: 10 mg         Qty: 4 boxes  LOT: [id number]  Exp.Date: 11/03/25  Dosing instructions: take 1 tablet daily  The patient has been instructed regarding the correct time, dose, and frequency of taking this medication, including desired effects and most common side effects.   Loy Mccartt M Ellieanna Funderburg 1:20 PM 04/07/2023   ----- Message from Marca Ancona sent at 04/06/2023  7:32 AM EDT ----- LDL still elevated.  Increase atorvastatin 40 mg daily.  Lipid/LFTs.

## 2023-04-09 ENCOUNTER — Other Ambulatory Visit (HOSPITAL_COMMUNITY): Payer: Self-pay | Admitting: Cardiology

## 2023-04-12 ENCOUNTER — Encounter: Payer: Self-pay | Admitting: Cardiology

## 2023-04-15 ENCOUNTER — Ambulatory Visit (HOSPITAL_COMMUNITY)
Admission: RE | Admit: 2023-04-15 | Discharge: 2023-04-15 | Disposition: A | Discharge status: Home / Self Care | Payer: BC Managed Care – PPO | Source: Ambulatory Visit | Attending: Family Medicine | Admitting: Family Medicine

## 2023-04-15 DIAGNOSIS — E785 Hyperlipidemia, unspecified: Secondary | ICD-10-CM | POA: Insufficient documentation

## 2023-04-15 DIAGNOSIS — I447 Left bundle-branch block, unspecified: Secondary | ICD-10-CM | POA: Insufficient documentation

## 2023-04-15 DIAGNOSIS — I2510 Atherosclerotic heart disease of native coronary artery without angina pectoris: Secondary | ICD-10-CM | POA: Diagnosis not present

## 2023-04-15 DIAGNOSIS — I5022 Chronic systolic (congestive) heart failure: Secondary | ICD-10-CM

## 2023-04-15 DIAGNOSIS — I071 Rheumatic tricuspid insufficiency: Secondary | ICD-10-CM | POA: Insufficient documentation

## 2023-04-15 DIAGNOSIS — Z950 Presence of cardiac pacemaker: Secondary | ICD-10-CM | POA: Insufficient documentation

## 2023-04-15 DIAGNOSIS — I110 Hypertensive heart disease with heart failure: Secondary | ICD-10-CM | POA: Insufficient documentation

## 2023-04-15 LAB — ECHOCARDIOGRAM COMPLETE
Area-P 1/2: 2.46 cm2
Calc EF: 46 %
S' Lateral: 3.3 cm
Single Plane A2C EF: 47.8 %
Single Plane A4C EF: 46.6 %

## 2023-05-16 ENCOUNTER — Other Ambulatory Visit (HOSPITAL_COMMUNITY): Payer: Self-pay | Admitting: Cardiology

## 2023-05-16 MED ORDER — ICOSAPENT ETHYL 1 G PO CAPS: 2.0000 g | ORAL_CAPSULE | Freq: Two times a day (BID) | ORAL | 3 refills | Status: DC

## 2023-05-17 ENCOUNTER — Telehealth (HOSPITAL_COMMUNITY): Payer: Self-pay | Admitting: Pharmacy Technician

## 2023-05-17 ENCOUNTER — Other Ambulatory Visit (HOSPITAL_COMMUNITY): Payer: Self-pay

## 2023-05-17 NOTE — Telephone Encounter (Signed)
Patient Advocate Encounter   Received notification from [facility] that prior authorization for icosapent Ethyl is required.   PA submitted on CoverMyMeds Key [id number] Status is pending   Will continue to follow.

## 2023-05-17 NOTE — Telephone Encounter (Signed)
Advanced Heart Failure Patient Advocate Encounter  Prior Authorization for Icosapent Ethyl has been approved.    PA# [id number] Effective dates: 04/17/23 through 05/16/24  Patients co-pay is $91.44 (30 days)  Archer Asa, CPhT

## 2023-05-24 ENCOUNTER — Other Ambulatory Visit (HOSPITAL_COMMUNITY): Payer: Self-pay

## 2023-05-24 ENCOUNTER — Other Ambulatory Visit (HOSPITAL_COMMUNITY): Payer: Self-pay | Admitting: Cardiology

## 2023-05-24 MED ORDER — ICOSAPENT ETHYL 1 G PO CAPS: 2.0000 g | ORAL_CAPSULE | Freq: Two times a day (BID) | ORAL | 0 refills | Status: DC

## 2023-05-24 NOTE — Telephone Encounter (Signed)
Patient request PA under wife insurance for mail order Will need local rx while PA in process  Pharmacy team aware will review PA

## 2023-06-02 ENCOUNTER — Ambulatory Visit (INDEPENDENT_AMBULATORY_CARE_PROVIDER_SITE_OTHER): Payer: BC Managed Care – PPO

## 2023-06-02 DIAGNOSIS — I428 Other cardiomyopathies: Secondary | ICD-10-CM

## 2023-06-02 LAB — CUP PACEART REMOTE DEVICE CHECK
Battery Remaining Longevity: 65 mo
Battery Remaining Percentage: 74 %
Battery Voltage: 2.99 V
Brady Statistic AP VP Percent: 1 %
Brady Statistic AP VS Percent: 1 %
Brady Statistic AS VP Percent: 99 %
Brady Statistic AS VS Percent: 1 %
Brady Statistic RA Percent Paced: 1 %
Date Time Interrogation Session: 20240926052756
HighPow Impedance: 64 Ohm
HighPow Impedance: 64 Ohm
Implantable Lead Connection Status: 753985
Implantable Lead Connection Status: 753985
Implantable Lead Connection Status: 753985
Implantable Lead Implant Date: 20221228
Implantable Lead Implant Date: 20221228
Implantable Lead Implant Date: 20221228
Implantable Lead Location: 753858
Implantable Lead Location: 753859
Implantable Lead Location: 753860
Implantable Lead Model: 7122
Implantable Pulse Generator Implant Date: 20221228
Lead Channel Impedance Value: 480 Ohm
Lead Channel Impedance Value: 630 Ohm
Lead Channel Impedance Value: 900 Ohm
Lead Channel Pacing Threshold Amplitude: 0.5 V
Lead Channel Pacing Threshold Amplitude: 0.5 V
Lead Channel Pacing Threshold Amplitude: 1 V
Lead Channel Pacing Threshold Pulse Width: 0.5 ms
Lead Channel Pacing Threshold Pulse Width: 0.5 ms
Lead Channel Pacing Threshold Pulse Width: 0.8 ms
Lead Channel Sensing Intrinsic Amplitude: 12 mV
Lead Channel Sensing Intrinsic Amplitude: 3.2 mV
Lead Channel Setting Pacing Amplitude: 1.75 V
Lead Channel Setting Pacing Amplitude: 1.75 V
Lead Channel Setting Pacing Amplitude: 2 V
Lead Channel Setting Pacing Pulse Width: 0.5 ms
Lead Channel Setting Pacing Pulse Width: 0.8 ms
Lead Channel Setting Sensing Sensitivity: 0.5 mV
Pulse Gen Serial Number: 8926485
Zone Setting Status: 755011

## 2023-06-07 ENCOUNTER — Telehealth (HOSPITAL_COMMUNITY): Payer: Self-pay | Admitting: Pharmacy Technician

## 2023-06-07 NOTE — Telephone Encounter (Signed)
Advanced Heart Failure Patient Advocate Encounter  Received message that patient needs help with Vascepa. Left patient vm, advised him to return the call.

## 2023-06-08 ENCOUNTER — Other Ambulatory Visit (HOSPITAL_COMMUNITY): Payer: Self-pay | Admitting: Cardiology

## 2023-06-08 MED ORDER — ICOSAPENT ETHYL 1 G PO CAPS: 2.0000 g | ORAL_CAPSULE | Freq: Two times a day (BID) | ORAL | 3 refills | Status: DC

## 2023-06-14 NOTE — Progress Notes (Signed)
Remote ICD transmission.   

## 2023-06-16 NOTE — Telephone Encounter (Signed)
Advanced Heart Failure Patient Advocate Encounter  Spoke with patient. He wanted [facility] days [facility] and not [facility]. He did not return the call because he was able to get the problem fixed. He has received the medication and does not need anything else at this time.  Archer Asa, CPhT

## 2023-06-17 ENCOUNTER — Telehealth (HOSPITAL_COMMUNITY): Payer: Self-pay | Admitting: Pharmacy Technician

## 2023-06-17 NOTE — Telephone Encounter (Signed)
Advanced Heart Failure Patient Advocate Encounter  Patient requested Farxiga samples. Called to confirm we have some available and waiting for him. Medication Samples have been provided to the patient.  Drug name: Marcelline Deist       Strength: 10mg         Qty: 4 boxes  LOT: [id number]  Exp.Date: 12/04/25  Dosing instructions: Take 1 tablet by mouth daily.  The patient has been instructed regarding the correct time, dose, and frequency of taking this medication, including desired effects and most common side effects.   Allen Kell [facility] 9:33 AM 06/17/2023

## 2023-07-04 ENCOUNTER — Other Ambulatory Visit (HOSPITAL_COMMUNITY): Payer: Self-pay | Admitting: Cardiology

## 2023-07-04 MED ORDER — SPIRONOLACTONE 25 MG PO TABS: 25.0000 mg | ORAL_TABLET | Freq: Every day | ORAL | 3 refills | Status: DC

## 2023-07-06 ENCOUNTER — Other Ambulatory Visit (HOSPITAL_COMMUNITY): Payer: BC Managed Care – PPO

## 2023-07-08 ENCOUNTER — Ambulatory Visit (HOSPITAL_COMMUNITY)
Admission: RE | Admit: 2023-07-08 | Discharge: 2023-07-08 | Disposition: A | Discharge status: Home / Self Care | Payer: BC Managed Care – PPO | Source: Ambulatory Visit | Attending: Internal Medicine | Admitting: Internal Medicine

## 2023-07-08 DIAGNOSIS — I5022 Chronic systolic (congestive) heart failure: Secondary | ICD-10-CM | POA: Insufficient documentation

## 2023-07-08 LAB — BASIC METABOLIC PANEL
Anion gap: 10 (ref 5–15)
BUN: 13 mg/dL (ref 8–23)
CO2: 26 mmol/L (ref 22–32)
Calcium: 9.1 mg/dL (ref 8.9–10.3)
Chloride: 103 mmol/L (ref 98–111)
Creatinine, Ser: 0.84 mg/dL (ref 0.61–1.24)
GFR, Estimated: >60 mL/min (ref >60)
Glucose, Bld: 99 mg/dL (ref 70–99)
Potassium: 3.8 mmol/L (ref 3.5–5.1)
Sodium: 139 mmol/L (ref 135–145)

## 2023-09-01 ENCOUNTER — Ambulatory Visit (INDEPENDENT_AMBULATORY_CARE_PROVIDER_SITE_OTHER): Payer: BC Managed Care – PPO

## 2023-09-01 DIAGNOSIS — I428 Other cardiomyopathies: Secondary | ICD-10-CM | POA: Diagnosis not present

## 2023-09-02 LAB — CUP PACEART REMOTE DEVICE CHECK
Battery Remaining Longevity: 63 mo
Battery Remaining Percentage: 72 %
Battery Voltage: 2.99 V
Brady Statistic AP VP Percent: 1 %
Brady Statistic AP VS Percent: 1 %
Brady Statistic AS VP Percent: 99 %
Brady Statistic AS VS Percent: 1 %
Brady Statistic RA Percent Paced: 1 %
Date Time Interrogation Session: 20241226170139
HighPow Impedance: 64 Ohm
HighPow Impedance: 64 Ohm
Implantable Lead Connection Status: 753985
Implantable Lead Connection Status: 753985
Implantable Lead Connection Status: 753985
Implantable Lead Implant Date: 20221228
Implantable Lead Implant Date: 20221228
Implantable Lead Implant Date: 20221228
Implantable Lead Location: 753858
Implantable Lead Location: 753859
Implantable Lead Location: 753860
Implantable Lead Model: 7122
Implantable Pulse Generator Implant Date: 20221228
Lead Channel Impedance Value: 1000 Ohm
Lead Channel Impedance Value: 450 Ohm
Lead Channel Impedance Value: 650 Ohm
Lead Channel Pacing Threshold Amplitude: 0.5 V
Lead Channel Pacing Threshold Amplitude: 0.5 V
Lead Channel Pacing Threshold Amplitude: 1 V
Lead Channel Pacing Threshold Pulse Width: 0.5 ms
Lead Channel Pacing Threshold Pulse Width: 0.5 ms
Lead Channel Pacing Threshold Pulse Width: 0.8 ms
Lead Channel Sensing Intrinsic Amplitude: 12 mV
Lead Channel Sensing Intrinsic Amplitude: 2.6 mV
Lead Channel Setting Pacing Amplitude: 1.75 V
Lead Channel Setting Pacing Amplitude: 1.75 V
Lead Channel Setting Pacing Amplitude: 2 V
Lead Channel Setting Pacing Pulse Width: 0.5 ms
Lead Channel Setting Pacing Pulse Width: 0.8 ms
Lead Channel Setting Sensing Sensitivity: 0.5 mV
Pulse Gen Serial Number: 8926485
Zone Setting Status: 755011

## 2023-10-04 ENCOUNTER — Other Ambulatory Visit (HOSPITAL_COMMUNITY): Payer: Self-pay | Admitting: Cardiology

## 2023-10-04 DIAGNOSIS — I5022 Chronic systolic (congestive) heart failure: Secondary | ICD-10-CM

## 2023-10-05 ENCOUNTER — Encounter: Payer: Self-pay | Admitting: Internal Medicine

## 2023-10-19 ENCOUNTER — Other Ambulatory Visit (HOSPITAL_COMMUNITY): Payer: Self-pay | Admitting: Cardiology

## 2023-10-19 DIAGNOSIS — I5022 Chronic systolic (congestive) heart failure: Secondary | ICD-10-CM

## 2023-10-19 MED ORDER — CARVEDILOL 25 MG PO TABS: 25.0000 mg | ORAL_TABLET | Freq: Two times a day (BID) | ORAL | 0 refills | Status: DC

## 2023-11-04 ENCOUNTER — Telehealth (HOSPITAL_COMMUNITY): Payer: Self-pay

## 2023-11-04 ENCOUNTER — Other Ambulatory Visit (HOSPITAL_COMMUNITY): Payer: Self-pay

## 2023-11-04 DIAGNOSIS — I5022 Chronic systolic (congestive) heart failure: Secondary | ICD-10-CM

## 2023-11-04 MED ORDER — ICOSAPENT ETHYL 1 G PO CAPS
2.0000 g | ORAL_CAPSULE | Freq: Two times a day (BID) | ORAL | 3 refills | Status: DC
Start: 2023-11-04 — End: 2024-04-20

## 2023-11-04 NOTE — Telephone Encounter (Signed)
 error

## 2023-11-04 NOTE — Telephone Encounter (Signed)
 Called and left patient a voice message to confirm/remind patient of their appointment at the [facility] on 11/07/23.   And reminded to bring all medications and/or complete list.

## 2023-11-07 ENCOUNTER — Encounter (HOSPITAL_COMMUNITY): Payer: Self-pay

## 2023-11-07 ENCOUNTER — Ambulatory Visit (HOSPITAL_COMMUNITY)
Admission: RE | Admit: 2023-11-07 | Discharge: 2023-11-07 | Disposition: A | Discharge status: Home / Self Care | Payer: BC Managed Care – PPO | Source: Ambulatory Visit | Attending: Cardiology | Admitting: Cardiology

## 2023-11-07 VITALS — BP 109/80 | HR 82 | Ht 68.0 in | Wt 150.0 lb

## 2023-11-07 DIAGNOSIS — I110 Hypertensive heart disease with heart failure: Secondary | ICD-10-CM | POA: Insufficient documentation

## 2023-11-07 DIAGNOSIS — Z95810 Presence of automatic (implantable) cardiac defibrillator: Secondary | ICD-10-CM | POA: Insufficient documentation

## 2023-11-07 DIAGNOSIS — Z79899 Other long term (current) drug therapy: Secondary | ICD-10-CM | POA: Diagnosis not present

## 2023-11-07 DIAGNOSIS — I447 Left bundle-branch block, unspecified: Secondary | ICD-10-CM | POA: Diagnosis not present

## 2023-11-07 DIAGNOSIS — I2510 Atherosclerotic heart disease of native coronary artery without angina pectoris: Secondary | ICD-10-CM | POA: Diagnosis not present

## 2023-11-07 DIAGNOSIS — I428 Other cardiomyopathies: Secondary | ICD-10-CM | POA: Diagnosis not present

## 2023-11-07 DIAGNOSIS — F17200 Nicotine dependence, unspecified, uncomplicated: Secondary | ICD-10-CM

## 2023-11-07 DIAGNOSIS — I10 Essential (primary) hypertension: Secondary | ICD-10-CM | POA: Diagnosis not present

## 2023-11-07 DIAGNOSIS — E785 Hyperlipidemia, unspecified: Secondary | ICD-10-CM | POA: Diagnosis not present

## 2023-11-07 DIAGNOSIS — Z87891 Personal history of nicotine dependence: Secondary | ICD-10-CM | POA: Insufficient documentation

## 2023-11-07 DIAGNOSIS — I5032 Chronic diastolic (congestive) heart failure: Secondary | ICD-10-CM

## 2023-11-07 DIAGNOSIS — IMO0001 Reserved for inherently not codable concepts without codable children: Secondary | ICD-10-CM

## 2023-11-07 LAB — BASIC METABOLIC PANEL
Anion gap: 8 (ref 5–15)
BUN: 13 mg/dL (ref 8–23)
CO2: 25 mmol/L (ref 22–32)
Calcium: 8.7 mg/dL — ABNORMAL LOW (ref 8.9–10.3)
Chloride: 105 mmol/L (ref 98–111)
Creatinine, Ser: 1.16 mg/dL (ref 0.61–1.24)
GFR, Estimated: >60 mL/min (ref >60)
Glucose, Bld: 94 mg/dL (ref 70–99)
Potassium: 3.8 mmol/L (ref 3.5–5.1)
Sodium: 138 mmol/L (ref 135–145)

## 2023-11-07 NOTE — Progress Notes (Signed)
 Patient ID: [name], male   DOB: 10/11/1960, 63 y.o.   MRN: [id number]     ADVANCED HF CLINIC NOTE   PCP: Dr. Wynelle Link Cardiology: Dr. Shirlee Latch  Chief Complaint: Heart Failure Follow- up  HPI: 63 y.o. with pmh of HFrEF due to NICM. Patient presented to [facility] in 3/11 after 2-3 weeks of shortness of breath.  He had a flu-like illness with congestion and myalgias and had been treated with clarithromycin as an outpatient.  However, he became progressively short of breath so went into the hospital.  He was noted to have BNP 976 and pulmonary edema on CXR.  Echo showed EF 20-25%.  Left heart cath was done showing mild coronary disease, suggesting that this was likely a nonischemic cardiomyopathy.  Patient was diuresed and begun on cardiac meds.  Cardiac MRI showed EF 20% without significant delayed enhancement, so no evidence for infiltrative disease.  Echo was repeated 9/11, showing some improvement.  Would estimate EF at 40% with diffuse hypokinesis.  Echo in 4/14 showed EF 55% with septal hypokinesis.  5/16 echo also showed EF 50-55% range.   Echo in 7/17 showed EF back down to 30-35%.  He had not been taking his medications regularly (thinks he was missing about 50% of doses).  Cardiolite done in 8/17, showing EF 36% and possible inferior MI.  Repeat cardiac cath in 9/17 after abnormal Cardiolite showed diffuse but nonobstructive CAD.  Repeat echo in 2/18 showed EF back up to 50%.    Echo in 1/20 showed EF 40-45% with normal RV size and systolic function. He was started back on Bidil.  Echo in 4/21 showed EF 40-45%, mild diffuse hypokinesis, normal RV.  Echo in 5/22 showed EF 25-30%, normal RV.  He saw Dr. Graciela Husbands to discuss CRT-D given LBBB-like IVCD => now with [facility] Jude CRT-D device.   Echo 5/23 showed EF 40-45% with normal RV (improvement).   Today he returns for HF follow up. Overall feeling great. 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 Denies increasing SOB, palpitations, abnormal  bleeding, CP, dizziness, edema, or PND/Orthopnea. Appetite ok. Taking all medications.   St Jude device interrogation (personally reviewed): >99%bi-v pacing, AT episodes on 2/1 for 40sec, <1% AF burden, Corvue stable, no thoracic impedence.  Past Medical History: 1. Nonischemic Cardiomyopathy: LHC (3/11) showed EF 15%, 40% ostial CFX, 40% prox RCA, 40-50% mid RCA.  Echo (3/11): Moderately dilated LV, EF 20-25%, global HK, severe diastolic dysfunction, mild RV dilation and dysfunction, moderate MR. SPEP/UPEP negative, ANA negative, HIV negative, TSH normal, no drug history.  Moderate ETOH use.  Highest suspicion probably viral myocarditis.   Cardiac MRI (4/11) showed a moderately dilated LV with global systolic dysfunction, EF 20%, normal RV size with moderate systolic dysfunction, small area of nonspecific subepicardial basal inferoseptal (RV insertion site) delayed enhancement.  Repeat echo (9/11): EF 40% with mild to moderate diffuse hypokinesis.  Echo (4/14) with EF 55%, septal hypokinesis, mild MR.  - Echo (5/16): EF 50-55%, moderate MR. - Echo (7/17): EF 30-35%, mild MR.   - LHC (9/17) with nonobstructive CAD.  - Echo (2/18): EF 50%, diffuse hypokinesis, normal RV size and systolic function.  - Echo (1/20): EF 40-45%, normal RV size and systolic function.  - Echo (4/21): EF 40-45%, normal RV  - Echo (5/22): EF 25-30%, normal RV. - St Jude CRT-D device - Echo (5/23): EF 40-45% with normal RV (improvement).  2. Nonobstructive CAD:  - LHC (3/11) showed EF 15%,  40% ostial CFX, 40% prox RCA, 40-50% mid RCA.  - Cardiolite (8/17) with EF 36%, possible inferior MI.  - LHC (9/17) with EF 45%, 50% ostial stenosis small AV LCx, diffuse RCA disease, 40% proximal/50% mid.  3. Smoking 4. Hyperlipidemia  Family History: The patient is adopted so does not know his family history.   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  smoked half pack per day for over  30 years, quit in 3/21.  Alcohol Use - yes, 2-3 drinks about 3-4 times a week in the past, has now cut back. Married, has two children.    ROS: All systems reviewed and negative except as per HPI.   Current Outpatient Medications  Medication Sig Dispense Refill   aspirin EC (ASPIRIN 81) 81 MG tablet Take 1 tablet (81 mg total) by mouth daily. 90 tablet 3   atorvastatin (LIPITOR) 40 MG tablet Take 1 tablet (40 mg total) by mouth daily. 90 tablet 3   carvedilol (COREG) 25 MG tablet Take 1 tablet (25 mg total) by mouth 2 (two) times daily with a meal. 180 tablet 0   dapagliflozin propanediol (FARXIGA) 10 MG TABS tablet Take 1 tablet (10 mg total) by mouth daily before breakfast. 30 tablet 3   ENTRESTO 97-103 MG TAKE 1 TABLET BY MOUTH TWICE A DAY 60 tablet 3   icosapent Ethyl (VASCEPA) 1 g capsule Take 2 capsules (2 g total) by mouth 2 (two) times daily. 360 capsule 3   isosorbide-hydrALAZINE (BIDIL) 20-37.5 MG tablet Take 1 tablet by mouth 3 (three) times daily. 18 tablet 0   spironolactone (ALDACTONE) 25 MG tablet Take 1 tablet (25 mg total) by mouth daily. 90 tablet 3   No current facility-administered medications for this encounter.   Wt Readings from Last 3 Encounters:  11/07/23 68 kg (150 lb)  04/05/23 71.1 kg (156 lb 12.8 oz)  08/26/22 71.5 kg (157 lb 9.6 oz)   BP 109/80   Pulse 82   Ht 5\' 8"  (1.727 m)   Wt 68 kg (150 lb)   SpO2 100%   BMI 22.81 kg/m   Physical Exam: General: Well appearing. No distress on RA Cardiac: JVP not visible. S1 and S2 present. No murmurs or rub. Resp: Lung sounds clear and equal B/L Extremities: Warm and dry. No edema.  Neuro: Alert and oriented x3. Affect pleasant. Moves all extremities without difficulty.  Assessment/Plan: 1. HFimpEF: History of nonischemic cardiomyopathy, had recovered to near normal on 2016 echo but EF down to 30-35% on 7/17 echo.  Repeat LHC in 9/17 showed diffuse but nonobstructive CAD.  EF was up to 50% on 2/18 echo, but  was down some on 1/20 echo to 40-45% and remained 40-45% on 4/21 echo. In 5/22 despite good compliance with GDMT, echo showed EF 25-30%. St Jude CRT-D placed. Echo 8/24 showed improvement with EF up to 45-50%.  - NYHA I. Volume stable by Corvue and exam. - Continue Coreg 25 mg bid. - Continue spironolactone 25 mg daily. BMET today. - Continue Bidil 1 tab tid. He has not tolerated increase in the past due to lightheadedness.  - Continue Entresto 97/103 mg bid. - Continue Farxiga 10 mg daily.   2. HTN: BP controlled.     3. Hyperlipidemia: On statin and Vascepa.      4. Smoking: He has now quit.   5. CAD: Diffuse nonobstructive disease on 9/17 cath. Continue ASA 81 and atorvastatin with goal LDL < 70.  Follow-up with Dr. Shirlee Latch in 6 months.   [place] Cassadi Purdie, NP 11/07/2023

## 2023-11-07 NOTE — Patient Instructions (Signed)
 No change in medications. Labs today - will call you if abnormal. Return to see Dr. Shirlee Latch in 6 months. Please call us in August to schedule this appointment. Please call us at [phone] if any questions or concerns prior to your next appointment.

## 2023-11-15 ENCOUNTER — Encounter: Payer: Self-pay | Admitting: Cardiology

## 2023-11-18 ENCOUNTER — Other Ambulatory Visit (HOSPITAL_COMMUNITY): Payer: Self-pay

## 2023-11-18 ENCOUNTER — Telehealth (HOSPITAL_COMMUNITY): Payer: Self-pay

## 2023-11-18 NOTE — Telephone Encounter (Signed)
 Patient called and needed Farxiga 10 mg Daily samples. A message was also sent to [facility] in pharmacy and she's going to start pt's Prior authorization.   Samples were placed up front for patient to pick up. Pt aware, agreeable, and verbalized understanding   Medication Samples have been provided to the patient.  Drug name: farxiga       Strength: 10 mg    Qty: 7  LOT: [id number]  Exp.Date: 06/05/26  Dosing instructions: Patient takes 1 tablet by mouth daily.  The patient has been instructed regarding the correct time, dose, and frequency of taking this medication, including desired effects and most common side effects.   Novella Rob Dinnis Rog 11:43 AM 11/18/2023

## 2023-11-22 ENCOUNTER — Telehealth (HOSPITAL_COMMUNITY): Payer: Self-pay

## 2023-11-22 ENCOUNTER — Other Ambulatory Visit (HOSPITAL_COMMUNITY): Payer: Self-pay

## 2023-11-22 NOTE — Telephone Encounter (Signed)
 Advanced Heart Failure Patient Advocate Encounter  Prior authorization for Marcelline Deist has been submitted and approved. Key: [id number] Effective: 10/23/2023 to 11/21/2024.  Test billing for this plan still returns prior auth rejection after approval. Pt also has [facility] [employer] listed which shows a copay of $15 for 30 days, and prefers 90 day supply filled via mail order.  I have been unable to resolve issues with RX FTLIN plan. Spoke with patient by phone, he is agreeable to having an rx sent to [facility] mail order for a 90 day supply under [facility] [employer] coverage (presumed copay $45 or less). I have messaged clinic staff to submit new rx.   [name], CPhT Rx Patient Advocate Phone: [phone]

## 2023-11-23 MED ORDER — DAPAGLIFLOZIN PROPANEDIOL 10 MG PO TABS: 10.0000 mg | ORAL_TABLET | Freq: Every day | ORAL | 3 refills | Status: DC

## 2023-11-23 NOTE — Addendum Note (Signed)
 Addended by: Theresia Bough on: 11/23/2023 09:51 AM   Modules accepted: Orders

## 2023-11-29 ENCOUNTER — Other Ambulatory Visit (HOSPITAL_COMMUNITY): Payer: Self-pay

## 2023-12-01 ENCOUNTER — Ambulatory Visit (INDEPENDENT_AMBULATORY_CARE_PROVIDER_SITE_OTHER): Payer: BC Managed Care – PPO

## 2023-12-01 DIAGNOSIS — I447 Left bundle-branch block, unspecified: Secondary | ICD-10-CM

## 2023-12-01 LAB — CUP PACEART REMOTE DEVICE CHECK
Battery Remaining Longevity: 61 mo
Battery Remaining Percentage: 68 %
Battery Voltage: 2.98 V
Brady Statistic AP VP Percent: 1 %
Brady Statistic AP VS Percent: 1 %
Brady Statistic AS VP Percent: 99 %
Brady Statistic AS VS Percent: 1 %
Brady Statistic RA Percent Paced: 1 %
Date Time Interrogation Session: 20250327040018
HighPow Impedance: 71 Ohm
HighPow Impedance: 71 Ohm
Implantable Lead Connection Status: 753985
Implantable Lead Connection Status: 753985
Implantable Lead Connection Status: 753985
Implantable Lead Implant Date: 20221228
Implantable Lead Implant Date: 20221228
Implantable Lead Implant Date: 20221228
Implantable Lead Location: 753858
Implantable Lead Location: 753859
Implantable Lead Location: 753860
Implantable Lead Model: 7122
Implantable Pulse Generator Implant Date: 20221228
Lead Channel Impedance Value: 1075 Ohm
Lead Channel Impedance Value: 450 Ohm
Lead Channel Impedance Value: 660 Ohm
Lead Channel Pacing Threshold Amplitude: 0.5 V
Lead Channel Pacing Threshold Amplitude: 0.5 V
Lead Channel Pacing Threshold Amplitude: 1 V
Lead Channel Pacing Threshold Pulse Width: 0.5 ms
Lead Channel Pacing Threshold Pulse Width: 0.5 ms
Lead Channel Pacing Threshold Pulse Width: 0.8 ms
Lead Channel Sensing Intrinsic Amplitude: 12 mV
Lead Channel Sensing Intrinsic Amplitude: 3 mV
Lead Channel Setting Pacing Amplitude: 1.75 V
Lead Channel Setting Pacing Amplitude: 1.75 V
Lead Channel Setting Pacing Amplitude: 2 V
Lead Channel Setting Pacing Pulse Width: 0.5 ms
Lead Channel Setting Pacing Pulse Width: 0.8 ms
Lead Channel Setting Sensing Sensitivity: 0.5 mV
Pulse Gen Serial Number: 8926485
Zone Setting Status: 755011

## 2023-12-02 ENCOUNTER — Telehealth (HOSPITAL_COMMUNITY): Payer: Self-pay

## 2023-12-02 ENCOUNTER — Other Ambulatory Visit (HOSPITAL_COMMUNITY): Payer: Self-pay

## 2023-12-02 NOTE — Telephone Encounter (Addendum)
 Patient called in and LVM stating he is having difficulty getting [place] from [facility]. He called the pharmacy and they stated a PA was required.  Called [facility] to verify, since we documented PA approved on 11/22/23 (key: [id number]). Was connected with [employer] insurance, not the pharmacy, but representative initially stated PA was required. However, representative got a paid claim per test billing when using insurance with patient's spouse as primary Lexicographer (subscriber ID: [id number]). Attempted to then provide $0 coupon card information and was informed that [facility] no longer accepts manufacturer coupon cards as of March 2024. Representative stated that [facility], and they would be able to process the coupon card.   Provided verbal Rx to patient's preferred pharmacy [facility] [address] to test coverage and provide coupon card information. Verified correct insurance subscriber ID and they reported they were getting a denial - PA Required. Found that patient was eligible to use 1x 30 day Farxiga Free trial card. [facility], who [facility].  Patient in agreement with plan. On Monday, 12/05/23 will have patient advocate team reach out to patient to confirm active insurance and troubleshoot PA issue if needed. [facility] than [facility] moving forward as he would be eligible to use $0 copay card there.     Nils Pyle, PharmD PGY1 Pharmacy Resident

## 2023-12-05 ENCOUNTER — Other Ambulatory Visit (HOSPITAL_COMMUNITY): Payer: Self-pay

## 2023-12-07 ENCOUNTER — Other Ambulatory Visit (HOSPITAL_COMMUNITY): Payer: Self-pay

## 2023-12-07 MED ORDER — DAPAGLIFLOZIN PROPANEDIOL 10 MG PO TABS: 10.0000 mg | ORAL_TABLET | Freq: Every day | ORAL | 3 refills | Status: DC

## 2023-12-07 NOTE — Addendum Note (Signed)
 Addended by: Dorris Vangorder, Milagros Reap on: 12/07/2023 04:43 PM   Modules accepted: Orders

## 2023-12-17 ENCOUNTER — Encounter: Payer: Self-pay | Admitting: Internal Medicine

## 2024-01-11 NOTE — Progress Notes (Signed)
 Remote ICD transmission.

## 2024-03-01 ENCOUNTER — Ambulatory Visit (INDEPENDENT_AMBULATORY_CARE_PROVIDER_SITE_OTHER): Payer: BC Managed Care – PPO

## 2024-03-01 DIAGNOSIS — I447 Left bundle-branch block, unspecified: Secondary | ICD-10-CM | POA: Diagnosis not present

## 2024-03-01 LAB — CUP PACEART REMOTE DEVICE CHECK
Battery Remaining Longevity: 57 mo
Battery Remaining Percentage: 65 %
Battery Voltage: 2.98 V
Brady Statistic AP VP Percent: 1 %
Brady Statistic AP VS Percent: 1 %
Brady Statistic AS VP Percent: 99 %
Brady Statistic AS VS Percent: 1 %
Brady Statistic RA Percent Paced: 1 %
Date Time Interrogation Session: 20250626040019
HighPow Impedance: 69 Ohm
HighPow Impedance: 69 Ohm
Implantable Lead Connection Status: 753985
Implantable Lead Connection Status: 753985
Implantable Lead Connection Status: 753985
Implantable Lead Implant Date: 20221228
Implantable Lead Implant Date: 20221228
Implantable Lead Implant Date: 20221228
Implantable Lead Location: 753858
Implantable Lead Location: 753859
Implantable Lead Location: 753860
Implantable Lead Model: 7122
Implantable Pulse Generator Implant Date: 20221228
Lead Channel Impedance Value: 1025 Ohm
Lead Channel Impedance Value: 460 Ohm
Lead Channel Impedance Value: 580 Ohm
Lead Channel Pacing Threshold Amplitude: 0.5 V
Lead Channel Pacing Threshold Amplitude: 0.5 V
Lead Channel Pacing Threshold Amplitude: 1 V
Lead Channel Pacing Threshold Pulse Width: 0.5 ms
Lead Channel Pacing Threshold Pulse Width: 0.5 ms
Lead Channel Pacing Threshold Pulse Width: 0.8 ms
Lead Channel Sensing Intrinsic Amplitude: 12 mV
Lead Channel Sensing Intrinsic Amplitude: 2.5 mV
Lead Channel Setting Pacing Amplitude: 1.75 V
Lead Channel Setting Pacing Amplitude: 1.75 V
Lead Channel Setting Pacing Amplitude: 2 V
Lead Channel Setting Pacing Pulse Width: 0.5 ms
Lead Channel Setting Pacing Pulse Width: 0.8 ms
Lead Channel Setting Sensing Sensitivity: 0.5 mV
Pulse Gen Serial Number: 8926485
Zone Setting Status: 755011

## 2024-03-31 ENCOUNTER — Other Ambulatory Visit (HOSPITAL_COMMUNITY): Payer: Self-pay | Admitting: Cardiology

## 2024-04-16 ENCOUNTER — Other Ambulatory Visit (HOSPITAL_COMMUNITY): Payer: Self-pay

## 2024-04-16 ENCOUNTER — Telehealth (HOSPITAL_COMMUNITY): Payer: Self-pay

## 2024-04-16 NOTE — Telephone Encounter (Signed)
 Advanced Heart Failure Patient Advocate Encounter  Prior authorization for Icosapent  has been submitted and approved. Test billing returns $48.89 for 30 day supply. This insurance limits 30 day supply.  Key: [id number] Effective: 03/17/2024 to 04/16/2025  Rachel DEL, CPhT Rx Patient Advocate Phone: [phone]

## 2024-04-19 ENCOUNTER — Telehealth (HOSPITAL_COMMUNITY): Payer: Self-pay | Admitting: Pharmacy Technician

## 2024-04-19 ENCOUNTER — Other Ambulatory Visit (HOSPITAL_COMMUNITY): Payer: Self-pay

## 2024-04-19 NOTE — Telephone Encounter (Signed)
 Advanced Heart Failure Patient Product/process development scientist completed.   The patient is insured through [employer] (Pharmacy had a question about PA approval, see previous note for approval through 04/16/25.)  Ran test claim for Vascepa  (generic). Currently a quantity of 120 is a 30 day supply and the co-pay is $70 .  This test claim was processed through [facility]- copay amounts may vary at other pharmacies due to pharmacy/plan contracts, or as the patient moves through the different stages of their insurance plan.   Almarie JULIANNA Pa, CPhT

## 2024-04-20 ENCOUNTER — Other Ambulatory Visit (HOSPITAL_COMMUNITY): Payer: Self-pay

## 2024-04-20 ENCOUNTER — Ambulatory Visit: Attending: Cardiovascular Disease | Admitting: Cardiovascular Disease

## 2024-04-20 ENCOUNTER — Encounter: Payer: Self-pay | Admitting: Cardiovascular Disease

## 2024-04-20 VITALS — BP 110/60 | HR 87 | Ht 68.0 in | Wt 151.7 lb

## 2024-04-20 DIAGNOSIS — I447 Left bundle-branch block, unspecified: Secondary | ICD-10-CM | POA: Diagnosis not present

## 2024-04-20 DIAGNOSIS — Z95810 Presence of automatic (implantable) cardiac defibrillator: Secondary | ICD-10-CM | POA: Diagnosis not present

## 2024-04-20 DIAGNOSIS — I428 Other cardiomyopathies: Secondary | ICD-10-CM

## 2024-04-20 DIAGNOSIS — I5022 Chronic systolic (congestive) heart failure: Secondary | ICD-10-CM

## 2024-04-20 MED ORDER — ICOSAPENT ETHYL 1 G PO CAPS
2.0000 g | ORAL_CAPSULE | Freq: Two times a day (BID) | ORAL | 3 refills | Status: DC
Start: 2024-04-20 — End: 2024-04-20

## 2024-04-20 MED ORDER — ICOSAPENT ETHYL 1 G PO CAPS
2.0000 g | ORAL_CAPSULE | Freq: Two times a day (BID) | ORAL | 3 refills | Status: AC
Start: 2024-04-20 — End: ?

## 2024-04-20 NOTE — Addendum Note (Signed)
 Addended by: ELINDA ROLIN SAILOR on: 04/20/2024 12:00 PM   Modules accepted: Orders

## 2024-04-20 NOTE — Patient Instructions (Signed)
 Medication Instructions:  Your physician recommends that you continue on your current medications as directed. Please refer to the Current Medication list given to you today.  *If you need a refill on your cardiac medications before your next appointment, please call your pharmacy*   Follow-Up: At [facility], you and your health needs are our priority.  As part of our continuing mission to provide you with exceptional heart care, our providers are all part of one team.  This team includes your primary Cardiologist (physician) and Advanced Practice Providers or APPs (Physician Assistants and Nurse Practitioners) who all work together to provide you with the care you need, when you need it.  Your next appointment:   1 year(s)  Provider:   Eulas Furbish, MD

## 2024-04-20 NOTE — Progress Notes (Signed)
  Electrophysiology Office Note:    Date:  04/20/2024   ID:  [name], DOB 05/03/1961, MRN [id number]  PCP:  Sun, Vyvyan, MD   [facility] Providers Cardiologist:  None Electrophysiologist:  Eulas FORBES Furbish, MD     Referring MD: Sun, Vyvyan, MD   History of Present Illness:    [name] is a 63 y.o. male with a medical history significant for BiV ICD, nonischemic cardiomyopathy referred for device and rhythm follow-up.     Discussed the use of AI scribe software for clinical note transcription with the patient, who gave verbal consent to proceed.  History of Present Illness [name] is a 63 year old male with non-ischemic cardiomyopathy who presents for follow-up of his CRT defibrillator.  He has a St. Jude CRT defibrillator implanted in January 2023 for primary prevention. Previously classified as NYHA class II.  A transthoracic echocardiogram in May 2023 showed a left ventricular ejection fraction of 40-45%, grade one diastolic dysfunction, and a mildly dilated left atrium. He has a typical left bundle branch block pattern with the CRT.  No new symptoms or changes in his condition since the last evaluation.         Today, he reports he feels fine. he has no device related complaints -- no new tenderness, drainage, redness.   EKGs/Labs/Other Studies Reviewed Today:     Echocardiogram:  TTE May 2023 LVEF 40 to 45%.  Grade 1 diastolic dysfunction.  Mildly dilated left atrium.    Cardiac catherization  September 2017 Reviewed in epic  EKG:   EKG Interpretation Date/Time:  Friday April 20 2024 15:11:17 EDT Ventricular Rate:  87 PR Interval:  118 QRS Duration:  126 QT Interval:  414 QTC Calculation: 498 R Axis:   228  Text Interpretation: Atrial-sensed ventricular-paced rhythm When compared with ECG of 05-Apr-2023 08:37, Vent. rate has increased BY  15 BPM Confirmed by Furbish Eulas [phone]) on 04/20/2024 3:26:24 PM      Physical Exam:    VS:  BP 110/60 (BP Location: Left Arm, Patient Position: Sitting, Cuff Size: Normal)   Pulse 87   Ht 5' 8 (1.727 m)   Wt 151 lb 11.2 oz (68.8 kg)   SpO2 97%   BMI 23.07 kg/m     Wt Readings from Last 3 Encounters:  04/20/24 151 lb 11.2 oz (68.8 kg)  11/07/23 150 lb (68 kg)  04/05/23 156 lb 12.8 oz (71.1 kg)     GEN: Well nourished, well developed in no acute distress CARDIAC: RRR, no murmurs, rubs, gallops RESPIRATORY:  Normal work of breathing MUSCULOSKELETAL: no edema    ASSESSMENT & PLAN:     Saint Jude CRT-D I reviewed today's device interrogation.  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

## 2024-05-11 ENCOUNTER — Telehealth (HOSPITAL_COMMUNITY): Payer: Self-pay

## 2024-05-11 NOTE — Progress Notes (Addendum)
 Patient ID: [name], male   DOB: Aug 01, 1961, 63 y.o.   MRN: [id number]     ADVANCED HF CLINIC NOTE   PCP: Dr. Austin Cardiology: Dr. Rolan  HPI: 63 y.o. with pmh of HFrEF due to NICM. Patient presented to [facility] in 3/11 after 2-3 weeks of shortness of breath.  He had a flu-like illness with congestion and myalgias and had been treated with clarithromycin as an outpatient.  However, he became progressively short of breath so went into the hospital.  He was noted to have BNP 976 and pulmonary edema on CXR.  Echo showed EF 20-25%.  Left heart cath was done showing mild coronary disease, suggesting that this was likely a nonischemic cardiomyopathy.  Patient was diuresed and begun on cardiac meds.  Cardiac MRI showed EF 20% without significant delayed enhancement, so no evidence for infiltrative disease.  Echo was repeated 9/11, showing some improvement.  Would estimate EF at 40% with diffuse hypokinesis.  Echo in 4/14 showed EF 55% with septal hypokinesis.  5/16 echo also showed EF 50-55% range.   Echo in 7/17 showed EF back down to 30-35%.  He had not been taking his medications regularly (thinks he was missing about 50% of doses).  Cardiolite done in 8/17, showing EF 36% and possible inferior MI.  Repeat cardiac cath in 9/17 after abnormal Cardiolite showed diffuse but nonobstructive CAD.  Repeat echo in 2/18 showed EF back up to 50%.    Echo in 1/20 showed EF 40-45% with normal RV size and systolic function. He was started back on Bidil.  Echo in 4/21 showed EF 40-45%, mild diffuse hypokinesis, normal RV.  Echo in 5/22 showed EF 25-30%, normal RV.  He saw Dr. Fernande to discuss CRT-D given LBBB-like IVCD => now with [facility] Jude CRT-D device.   Echo 5/23 showed EF 40-45% with normal RV (improvement).   Echo 8/24: EF 45-50%, RV normal  Today he returns for HF follow up. Overall feeling fine. 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 Denies  palpitations,  abnormal bleeding, CP, dizziness, edema, or PND/Orthopnea. Appetite ok. Weight at home 150 pounds. Taking all medications.   St Jude device interrogation (personally reviewed): CorVue stable, >99% BV, no VT  ECG (personally reviewed from 04/20/24): A sensed V paced  Labs (3/11): UPEP/SPEP negative, ANA negative, urine drug screen negative, HIV negative Labs (7/24): LDL 98 Labs (3/25): K 3.8, creatinine 1.16  Past Medical History: 1. Nonischemic Cardiomyopathy: LHC (3/11) showed EF 15%, 40% ostial CFX, 40% prox RCA, 40-50% mid RCA.  Echo (3/11): Moderately dilated LV, EF 20-25%, global HK, severe diastolic dysfunction, mild RV dilation and dysfunction, moderate MR. SPEP/UPEP negative, ANA negative, HIV negative, TSH normal, no drug history.  Moderate ETOH use.  Highest suspicion probably viral myocarditis.   Cardiac MRI (4/11) showed a moderately dilated LV with global systolic dysfunction, EF 20%, normal RV size with moderate systolic dysfunction, small area of nonspecific subepicardial basal inferoseptal (RV insertion site) delayed enhancement.  Repeat echo (9/11): EF 40% with mild to moderate diffuse hypokinesis.  Echo (4/14) with EF 55%, septal hypokinesis, mild MR.  - Echo (5/16): EF 50-55%, moderate MR. - Echo (7/17): EF 30-35%, mild MR.   - LHC (9/17) with nonobstructive CAD.  - Echo (2/18): EF 50%, diffuse hypokinesis, normal RV size and systolic function.  - Echo (1/20): EF 40-45%, normal RV size and systolic function.  - Echo (4/21): EF 40-45%, normal RV  -  Echo (5/22): EF 25-30%, normal RV. - St Jude CRT-D device - Echo (5/23): EF 40-45% with normal RV (improvement). - Echo 8/24: EF 45-50%, normal RV  2. Nonobstructive CAD:  - LHC (3/11) showed EF 15%, 40% ostial CFX, 40% prox RCA, 40-50% mid RCA.  - Cardiolite (8/17) with EF 36%, possible inferior MI.  - LHC (9/17) with EF 45%, 50% ostial stenosis small AV LCx, diffuse RCA disease, 40% proximal/50% mid.  3. Prior Smoker 4.  Hyperlipidemia  Family History: The patient is adopted so does not know his family history.   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  smoked half pack per day for over 30 years, quit in 3/21.  Alcohol Use - yes, 2-3 drinks about 3-4 times a week in the past, has now cut back. Married, has two children.    ROS: All systems reviewed and negative except as per HPI.   Current Outpatient Medications  Medication Sig Dispense Refill   aspirin  EC (ASPIRIN  81) 81 MG tablet Take 1 tablet (81 mg total) by mouth daily. 90 tablet 3   atorvastatin  (LIPITOR) 40 MG tablet Take 1 tablet (40 mg total) by mouth daily. 90 tablet 3   carvedilol  (COREG ) 25 MG tablet Take 1 tablet (25 mg total) by mouth 2 (two) times daily with a meal. 180 tablet 0   dapagliflozin  propanediol (FARXIGA ) 10 MG TABS tablet Take 1 tablet (10 mg total) by mouth daily before breakfast. 90 tablet 3   ENTRESTO  97-103 MG TAKE 1 TABLET BY MOUTH TWICE A DAY 60 tablet 3   icosapent  Ethyl (VASCEPA ) 1 g capsule Take 2 capsules (2 g total) by mouth 2 (two) times daily. 360 capsule 3   isosorbide-hydrALAZINE  (BIDIL) 20-37.5 MG tablet Take 1 tablet by mouth 3 (three) times daily. 18 tablet 0   spironolactone  (ALDACTONE ) 25 MG tablet Take 1 tablet (25 mg total) by mouth daily. 90 tablet 3   No current facility-administered medications for this encounter.   Wt Readings from Last 3 Encounters:  05/14/24 67.6 kg (149 lb)  04/20/24 68.8 kg (151 lb 11.2 oz)  11/07/23 68 kg (150 lb)   BP 100/60   Pulse 90   Ht 5' 8 (1.727 m)   Wt 67.6 kg (149 lb)   SpO2 98%   BMI 22.66 kg/m   Physical Exam: General:  NAD. No resp difficulty, walked into clinic HEENT: Normal Neck: Supple. No JVD. Cor: Regular rate & rhythm. No rubs, gallops or murmurs. Lungs: Clear Abdomen: Soft, nontender, nondistended.  Extremities: No cyanosis, clubbing, rash, edema Neuro: Alert & oriented x 3, moves all 4 extremities  w/o difficulty. Affect pleasant.  Assessment/Plan: 1. HFimpEF: History of nonischemic cardiomyopathy, had recovered to near normal on 2016 echo but EF down to 30-35% on 7/17 echo.  Repeat LHC in 9/17 showed diffuse but nonobstructive CAD.  EF was up to 50% on 2/18 echo, but was down some on 1/20 echo to 40-45% and remained 40-45% on 4/21 echo. In 5/22 despite good compliance with GDMT, echo showed EF 25-30%. St Jude CRT-D placed. Echo 8/24 showed improvement with EF up to 45-50%. NYHA I. Volume stable by Corvue and exam. - Continue Coreg  25 mg bid. - Continue spironolactone  25 mg daily. BMET today. - Continue Bidil 1 tab tid. He has not tolerated increase in the past due to lightheadedness.  - Continue Entresto  97/103 mg bid. - Stop Farixga, start Jardiance  10 mg daily. (Cheaper co-pay)  2. HTN: BP controlled.    - Continue current meds. 3. Hyperlipidemia: On atorvastatin  40 mg daily and Vascepa  2g bid.     4. Smoking: He has now quit.  5. CAD: Diffuse nonobstructive disease on 9/17 cath. No chest pain. - Continue ASA 81 and atorvastatin  with goal LDL < 70. Check lipids today.  Follow up in 4 months with Dr. Rolan + echo  Harlene HERO [place], [place] 05/14/2024

## 2024-05-11 NOTE — Telephone Encounter (Signed)
 Called to confirm/remind patient of their appointment at the [facility] on 05/14/24.   Appointment:   [x] Confirmed  [] Left mess   [] No answer/No voice mail  [] VM Full/unable to leave message  [] Phone not in service  Patient reminded to bring all medications and/or complete list.  Confirmed patient has transportation. Gave directions, instructed to utilize valet parking.

## 2024-05-14 ENCOUNTER — Encounter (HOSPITAL_COMMUNITY): Payer: Self-pay

## 2024-05-14 ENCOUNTER — Telehealth (HOSPITAL_COMMUNITY): Payer: Self-pay

## 2024-05-14 ENCOUNTER — Other Ambulatory Visit (HOSPITAL_COMMUNITY): Payer: Self-pay

## 2024-05-14 ENCOUNTER — Ambulatory Visit (HOSPITAL_COMMUNITY)
Admission: RE | Admit: 2024-05-14 | Discharge: 2024-05-14 | Disposition: A | Discharge status: Home / Self Care | Source: Ambulatory Visit | Attending: Family Medicine

## 2024-05-14 VITALS — BP 100/60 | HR 90 | Ht 68.0 in | Wt 149.0 lb

## 2024-05-14 DIAGNOSIS — Z7982 Long term (current) use of aspirin: Secondary | ICD-10-CM | POA: Insufficient documentation

## 2024-05-14 DIAGNOSIS — I10 Essential (primary) hypertension: Secondary | ICD-10-CM

## 2024-05-14 DIAGNOSIS — Z87891 Personal history of nicotine dependence: Secondary | ICD-10-CM | POA: Diagnosis not present

## 2024-05-14 DIAGNOSIS — E785 Hyperlipidemia, unspecified: Secondary | ICD-10-CM | POA: Diagnosis not present

## 2024-05-14 DIAGNOSIS — Z79899 Other long term (current) drug therapy: Secondary | ICD-10-CM | POA: Insufficient documentation

## 2024-05-14 DIAGNOSIS — I428 Other cardiomyopathies: Secondary | ICD-10-CM | POA: Insufficient documentation

## 2024-05-14 DIAGNOSIS — I2510 Atherosclerotic heart disease of native coronary artery without angina pectoris: Secondary | ICD-10-CM | POA: Diagnosis not present

## 2024-05-14 DIAGNOSIS — I110 Hypertensive heart disease with heart failure: Secondary | ICD-10-CM | POA: Insufficient documentation

## 2024-05-14 DIAGNOSIS — Z95810 Presence of automatic (implantable) cardiac defibrillator: Secondary | ICD-10-CM | POA: Diagnosis not present

## 2024-05-14 DIAGNOSIS — I5022 Chronic systolic (congestive) heart failure: Secondary | ICD-10-CM | POA: Insufficient documentation

## 2024-05-14 LAB — COMPREHENSIVE METABOLIC PANEL WITH GFR
ALT: 30 U/L (ref 0–44)
AST: 30 U/L (ref 15–41)
Albumin: 3.3 g/dL — ABNORMAL LOW (ref 3.5–5.0)
Alkaline Phosphatase: 57 U/L (ref 38–126)
Anion gap: 16 — ABNORMAL HIGH (ref 5–15)
BUN: 12 mg/dL (ref 8–23)
CO2: 21 mmol/L — ABNORMAL LOW (ref 22–32)
Calcium: 8.7 mg/dL — ABNORMAL LOW (ref 8.9–10.3)
Chloride: 103 mmol/L (ref 98–111)
Creatinine, Ser: 0.83 mg/dL (ref 0.61–1.24)
GFR, Estimated: >60 mL/min (ref >60)
Glucose, Bld: 103 mg/dL — ABNORMAL HIGH (ref 70–99)
Potassium: 3.9 mmol/L (ref 3.5–5.1)
Sodium: 140 mmol/L (ref 135–145)
Total Bilirubin: 0.9 mg/dL (ref 0.0–1.2)
Total Protein: 6.5 g/dL (ref 6.5–8.1)

## 2024-05-14 LAB — LIPID PANEL
Cholesterol: 175 mg/dL (ref 0–200)
HDL: 95 mg/dL (ref >40)
LDL Cholesterol: 1 mg/dL (ref 0–99)
Total CHOL/HDL Ratio: 1.8 ratio
Triglycerides: 393 mg/dL — ABNORMAL HIGH (ref <150)
VLDL: 79 mg/dL — ABNORMAL HIGH (ref 0–40)

## 2024-05-14 MED ORDER — EMPAGLIFLOZIN 10 MG PO TABS: 10.0000 mg | ORAL_TABLET | Freq: Every day | ORAL | 11 refills | Status: DC

## 2024-05-14 NOTE — Patient Instructions (Addendum)
 Stop Farxiga . Start Jardiance  10 mg daily - Rx sent to local pharmacy. Provided patient with Jardiance  co-pay card to present to local pharmacy. 2.   Labs today - will call you if abnormal. 3.   Return to see Dr. Rolan in 4 months with echo.  PLEASE CALL [phone] IN DECEMBER TO SCHEDULE THIS APPOINTMENT. 4.   Please call us  at [phone] if any questions or concerns prior to your next visit.

## 2024-05-14 NOTE — Telephone Encounter (Signed)
 Advanced Heart Failure Patient Advocate Encounter  Prior authorization for Jardiance  has been submitted and approved. Test billing returns $125 for 30 day supply.This patient is eligible to use copay savings card to reduce cost.  Key: [id number] Effective: 04/14/2024 to 05/14/2025  Rachel DEL, CPhT Rx Patient Advocate Phone: [phone]

## 2024-05-16 ENCOUNTER — Ambulatory Visit (HOSPITAL_COMMUNITY): Payer: Self-pay | Admitting: Family Medicine

## 2024-05-28 ENCOUNTER — Telehealth (HOSPITAL_COMMUNITY): Payer: Self-pay

## 2024-05-28 ENCOUNTER — Encounter: Payer: Self-pay | Admitting: Family Medicine

## 2024-05-28 ENCOUNTER — Other Ambulatory Visit (HOSPITAL_COMMUNITY): Payer: Self-pay

## 2024-05-28 NOTE — Telephone Encounter (Signed)
 Advanced Heart Failure Patient Advocate Encounter  Patient contacted office with concerns about Entresto  pricing. Test billing indicates this plan limits 30 day supply, with a copay of $16.21 fosacubitrilvalsartan .  Spoke to pharmacy to reprocess, confirmed generic is $16.21 for 30 day supply. Left voicemail for patient.  Rachel DEL, CPhT Rx Patient Advocate Phone: [phone]

## 2024-05-31 ENCOUNTER — Ambulatory Visit (INDEPENDENT_AMBULATORY_CARE_PROVIDER_SITE_OTHER): Payer: BC Managed Care – PPO

## 2024-05-31 DIAGNOSIS — I447 Left bundle-branch block, unspecified: Secondary | ICD-10-CM

## 2024-06-01 LAB — CUP PACEART REMOTE DEVICE CHECK
Battery Remaining Longevity: 54 mo
Battery Remaining Percentage: 62 %
Battery Voltage: 2.98 V
Brady Statistic AP VP Percent: 1 %
Brady Statistic AP VS Percent: 1 %
Brady Statistic AS VP Percent: 99 %
Brady Statistic AS VS Percent: 1 %
Brady Statistic RA Percent Paced: 1 %
Date Time Interrogation Session: 20250925174715
HighPow Impedance: 71 Ohm
HighPow Impedance: 71 Ohm
Implantable Lead Connection Status: 753985
Implantable Lead Connection Status: 753985
Implantable Lead Connection Status: 753985
Implantable Lead Implant Date: 20221228
Implantable Lead Implant Date: 20221228
Implantable Lead Implant Date: 20221228
Implantable Lead Location: 753858
Implantable Lead Location: 753859
Implantable Lead Location: 753860
Implantable Lead Model: 7122
Implantable Pulse Generator Implant Date: 20221228
Lead Channel Impedance Value: 1050 Ohm
Lead Channel Impedance Value: 460 Ohm
Lead Channel Impedance Value: 610 Ohm
Lead Channel Pacing Threshold Amplitude: 0.5 V
Lead Channel Pacing Threshold Amplitude: 0.75 V
Lead Channel Pacing Threshold Amplitude: 1 V
Lead Channel Pacing Threshold Pulse Width: 0.5 ms
Lead Channel Pacing Threshold Pulse Width: 0.5 ms
Lead Channel Pacing Threshold Pulse Width: 0.8 ms
Lead Channel Sensing Intrinsic Amplitude: 11.4 mV
Lead Channel Sensing Intrinsic Amplitude: 3.1 mV
Lead Channel Setting Pacing Amplitude: 1.75 V
Lead Channel Setting Pacing Amplitude: 1.75 V
Lead Channel Setting Pacing Amplitude: 2 V
Lead Channel Setting Pacing Pulse Width: 0.5 ms
Lead Channel Setting Pacing Pulse Width: 0.8 ms
Lead Channel Setting Sensing Sensitivity: 0.5 mV
Pulse Gen Serial Number: 8926485
Zone Setting Status: 755011

## 2024-06-04 NOTE — Progress Notes (Signed)
 Remote ICD Transmission

## 2024-06-06 NOTE — Progress Notes (Signed)
 Remote ICD Transmission

## 2024-06-12 ENCOUNTER — Ambulatory Visit: Payer: Self-pay | Admitting: Cardiovascular Disease

## 2024-07-12 ENCOUNTER — Ambulatory Visit: Payer: Self-pay | Admitting: Cardiovascular Disease

## 2024-07-30 ENCOUNTER — Other Ambulatory Visit (HOSPITAL_COMMUNITY): Payer: Self-pay | Admitting: Cardiology

## 2024-07-30 DIAGNOSIS — I5022 Chronic systolic (congestive) heart failure: Secondary | ICD-10-CM

## 2024-07-30 MED ORDER — ATORVASTATIN CALCIUM 40 MG PO TABS
40.0000 mg | ORAL_TABLET | Freq: Every day | ORAL | 3 refills | Status: DC
Start: 2024-07-30 — End: 2024-08-01

## 2024-08-01 MED ORDER — ATORVASTATIN CALCIUM 40 MG PO TABS: 40.0000 mg | ORAL_TABLET | Freq: Every day | ORAL | 3 refills | Status: DC

## 2024-08-01 NOTE — Addendum Note (Signed)
 Addended by: Kali Ambler, DALTON HERO on: 08/01/2024 02:57 PM   Modules accepted: Orders

## 2024-08-30 ENCOUNTER — Ambulatory Visit

## 2024-08-30 DIAGNOSIS — I5022 Chronic systolic (congestive) heart failure: Secondary | ICD-10-CM | POA: Diagnosis not present

## 2024-08-31 LAB — CUP PACEART REMOTE DEVICE CHECK
Battery Remaining Longevity: 52 mo
Battery Remaining Percentage: 60 %
Battery Voltage: 2.98 V
Brady Statistic AP VP Percent: 1 %
Brady Statistic AP VS Percent: 1 %
Brady Statistic AS VP Percent: 99 %
Brady Statistic AS VS Percent: 1 %
Brady Statistic RA Percent Paced: 1 %
Date Time Interrogation Session: 20251225040017
HighPow Impedance: 68 Ohm
HighPow Impedance: 68 Ohm
Implantable Lead Connection Status: 753985
Implantable Lead Connection Status: 753985
Implantable Lead Connection Status: 753985
Implantable Lead Implant Date: 20221228
Implantable Lead Implant Date: 20221228
Implantable Lead Implant Date: 20221228
Implantable Lead Location: 753858
Implantable Lead Location: 753859
Implantable Lead Location: 753860
Implantable Lead Model: 7122
Implantable Pulse Generator Implant Date: 20221228
Lead Channel Impedance Value: 450 Ohm
Lead Channel Impedance Value: 580 Ohm
Lead Channel Impedance Value: 980 Ohm
Lead Channel Pacing Threshold Amplitude: 0.5 V
Lead Channel Pacing Threshold Amplitude: 0.75 V
Lead Channel Pacing Threshold Amplitude: 1 V
Lead Channel Pacing Threshold Pulse Width: 0.5 ms
Lead Channel Pacing Threshold Pulse Width: 0.5 ms
Lead Channel Pacing Threshold Pulse Width: 0.8 ms
Lead Channel Sensing Intrinsic Amplitude: 11.4 mV
Lead Channel Sensing Intrinsic Amplitude: 2.8 mV
Lead Channel Setting Pacing Amplitude: 1.75 V
Lead Channel Setting Pacing Amplitude: 1.75 V
Lead Channel Setting Pacing Amplitude: 2 V
Lead Channel Setting Pacing Pulse Width: 0.5 ms
Lead Channel Setting Pacing Pulse Width: 0.8 ms
Lead Channel Setting Sensing Sensitivity: 0.5 mV
Pulse Gen Serial Number: 8926485
Zone Setting Status: 755011

## 2024-09-05 ENCOUNTER — Ambulatory Visit: Payer: Self-pay | Admitting: Cardiovascular Disease

## 2024-09-13 ENCOUNTER — Other Ambulatory Visit (HOSPITAL_COMMUNITY): Payer: Self-pay | Admitting: Cardiology

## 2024-09-13 MED ORDER — SPIRONOLACTONE 25 MG PO TABS: 25.0000 mg | ORAL_TABLET | Freq: Every day | ORAL | 3 refills | Status: DC

## 2024-09-28 NOTE — Progress Notes (Signed)
 Patient ID: [name], male   DOB: 1961-03-20, 64 y.o.   MRN: [id number]     ADVANCED HF CLINIC NOTE   PCP: Dr. Austin Cardiology: Dr. Rolan  HPI: 64 y.o. with pmh of HFrEF due to NICM. Patient presented to [facility] in 3/11 after 2-3 weeks of shortness of breath.  He had a flu-like illness with congestion and myalgias and had been treated with clarithromycin as an outpatient.  However, he became progressively short of breath so went into the hospital.  He was noted to have BNP 976 and pulmonary edema on CXR.  Echo showed EF 20-25%.  Left heart cath was done showing mild coronary disease, suggesting that this was likely a nonischemic cardiomyopathy.  Patient was diuresed and begun on cardiac meds.  Cardiac MRI showed EF 20% without significant delayed enhancement, so no evidence for infiltrative disease.  Echo was repeated 9/11, showing some improvement.  Would estimate EF at 40% with diffuse hypokinesis.  Echo in 4/14 showed EF 55% with septal hypokinesis.  5/16 echo also showed EF 50-55% range.   Echo in 7/17 showed EF back down to 30-35%.  He had not been taking his medications regularly (thinks he was missing about 50% of doses).  Cardiolite done in 8/17, showing EF 36% and possible inferior MI.  Repeat cardiac cath in 9/17 after abnormal Cardiolite showed diffuse but nonobstructive CAD.  Repeat echo in 2/18 showed EF back up to 50%.    Echo in 1/20 showed EF 40-45% with normal RV size and systolic function. He was started back on Bidil.  Echo in 4/21 showed EF 40-45%, mild diffuse hypokinesis, normal RV.  Echo in 5/22 showed EF 25-30%, normal RV.  He saw Dr. Fernande to discuss CRT-D given LBBB-like IVCD => now with [facility] Jude CRT-D device.   Echo 5/23 showed EF 40-45% with normal RV (improvement).   Echo 8/24: EF 45-50%, RV normal  Today he returns for HF follow up. Overall feeling fine. 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 Denies palpitations, abnormal bleeding, CP, dizziness, edema, or PND/Orthopnea. Appetite ok. Does not weigh at home. Taking all medications.   Echo today 10/02/24, EF appears 40-45% on my read. Awaiting MD interpretation.  St Jude device interrogation (personally reviewed): CorVue stable, >99% BV, no VT, <1% AF  ECG (personally reviewed): A sensed V paced 69 bpm  Labs (3/11): UPEP/SPEP negative, ANA negative, urine drug screen negative, HIV negative Labs (7/24): LDL 98 Labs (3/25): K 3.8, creatinine 1.16 Labs (9/25): K 3.9, creatinine 0.83, LDL 1, TGs 393  Past Medical History: 1. Nonischemic Cardiomyopathy: LHC (3/11) showed EF 15%, 40% ostial CFX, 40% prox RCA, 40-50% mid RCA.  Echo (3/11): Moderately dilated LV, EF 20-25%, global HK, severe diastolic dysfunction, mild RV dilation and dysfunction, moderate MR. SPEP/UPEP negative, ANA negative, HIV negative, TSH normal, no drug history.  Moderate ETOH use.  Highest suspicion probably viral myocarditis.   Cardiac MRI (4/11) showed a moderately dilated LV with global systolic dysfunction, EF 20%, normal RV size with moderate systolic dysfunction, small area of nonspecific subepicardial basal inferoseptal (RV insertion site) delayed enhancement.  Repeat echo (9/11): EF 40% with mild to moderate diffuse hypokinesis.  Echo (4/14) with EF 55%, septal hypokinesis, mild MR.  - Echo (5/16): EF 50-55%, moderate MR. - Echo (7/17): EF 30-35%, mild MR.   - LHC (9/17) with nonobstructive CAD.  -  Echo (2/18): EF 50%, diffuse hypokinesis, normal RV size and systolic function.  - Echo (1/20): EF 40-45%, normal RV size and systolic function.  - Echo (4/21): EF 40-45%, normal RV  - Echo (5/22): EF 25-30%, normal RV. - St Jude CRT-D device - Echo (5/23): EF 40-45% with normal RV (improvement). - Echo 8/24: EF 45-50%, normal RV  2. Nonobstructive CAD:  - LHC (3/11) showed EF 15%, 40% ostial CFX, 40% prox RCA,  40-50% mid RCA.  - Cardiolite (8/17) with EF 36%, possible inferior MI.  - LHC (9/17) with EF 45%, 50% ostial stenosis small AV LCx, diffuse RCA disease, 40% proximal/50% mid.  3. Prior Smoker 4. Hyperlipidemia  Family History: The patient is adopted so does not know his family history.   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  smoked half pack per day for over 30 years, quit in 3/21.  Alcohol Use - yes, 2-3 drinks about 3-4 times a week in the past, has now cut back. Married, has two children.    ROS: All systems reviewed and negative except as per HPI.   Current Outpatient Medications  Medication Sig Dispense Refill   aspirin  EC (ASPIRIN  81) 81 MG tablet Take 1 tablet (81 mg total) by mouth daily. 90 tablet 3   atorvastatin  (LIPITOR) 40 MG tablet Take 1 tablet (40 mg total) by mouth daily. 90 tablet 3   carvedilol  (COREG ) 25 MG tablet Take 1 tablet (25 mg total) by mouth 2 (two) times daily with a meal. 180 tablet 0   empagliflozin  (JARDIANCE ) 10 MG TABS tablet Take 1 tablet (10 mg total) by mouth daily before breakfast. 30 tablet 11   ENTRESTO  97-103 MG TAKE 1 TABLET BY MOUTH TWICE A DAY 60 tablet 3   icosapent  Ethyl (VASCEPA ) 1 g capsule Take 2 capsules (2 g total) by mouth 2 (two) times daily. 360 capsule 3   isosorbide-hydrALAZINE  (BIDIL) 20-37.5 MG tablet Take 1 tablet by mouth 3 (three) times daily. 18 tablet 0   spironolactone  (ALDACTONE ) 25 MG tablet Take 1 tablet (25 mg total) by mouth daily. 90 tablet 3   No current facility-administered medications for this encounter.   Wt Readings from Last 3 Encounters:  10/02/24 68.8 kg (151 lb 9.6 oz)  05/14/24 67.6 kg (149 lb)  04/20/24 68.8 kg (151 lb 11.2 oz)   BP 110/70   Pulse 74   Wt 68.8 kg (151 lb 9.6 oz)   SpO2 99%   BMI 23.05 kg/m   Physical Exam: General:  NAD. No resp difficulty, walked into clinic HEENT: Normal Neck: Supple. No JVD. Cor: Regular rate & rhythm. No  rubs, gallops or murmurs. Lungs: Clear Abdomen: Soft, nontender, nondistended.  Extremities: No cyanosis, clubbing, rash, edema Neuro: Alert & oriented x 3, moves all 4 extremities w/o difficulty. Affect pleasant.  Assessment/Plan: 1. HFimpEF: History of nonischemic cardiomyopathy, had recovered to near normal on 2016 echo but EF down to 30-35% on 7/17 echo.  Repeat LHC in 9/17 showed diffuse but nonobstructive CAD.  EF was up to 50% on 2/18 echo, but was down some on 1/20 echo to 40-45% and remained 40-45% on 4/21 echo. In 5/22 despite good compliance with GDMT, echo showed EF 25-30%. St Jude CRT-D placed. Echo 8/24 showed improvement with EF up to 45-50%. NYHA I. Volume stable by Corvue and exam. - Continue Coreg  25 mg bid. - Continue spironolactone  25 mg daily. BMET today. - Continue Bidil 1 tab  tid. He has not tolerated increase in the past due to lightheadedness.  - Continue Entresto  97/103 mg bid. - Continue Jardiance  10 mg daily.   - Echo today, EF ~ 40-45%, on my read. Awaiting MD interpretation. 2. HTN: BP well-controlled.    - Continue current meds. 3. Hyperlipidemia: On atorvastatin  40 mg daily and Vascepa  2g bid.     4. Prior Smoker: Remains quit.  5. CAD: Diffuse nonobstructive disease on 9/17 cath. No chest pain. - Continue ASA 81 and atorvastatin  with goal LDL < 70.    Follow up in 6 months with Dr. Rolan Harlene CHRISTELLA Glena, FNP 10/02/2024

## 2024-10-02 ENCOUNTER — Ambulatory Visit (HOSPITAL_COMMUNITY)
Admission: RE | Admit: 2024-10-02 | Discharge: 2024-10-02 | Disposition: A | Source: Ambulatory Visit | Attending: Family Medicine

## 2024-10-02 ENCOUNTER — Encounter (HOSPITAL_COMMUNITY): Payer: Self-pay

## 2024-10-02 ENCOUNTER — Other Ambulatory Visit (HOSPITAL_COMMUNITY): Payer: Self-pay

## 2024-10-02 ENCOUNTER — Ambulatory Visit (HOSPITAL_COMMUNITY)
Admission: RE | Admit: 2024-10-02 | Discharge: 2024-10-02 | Disposition: A | Source: Ambulatory Visit | Attending: Family Medicine | Admitting: Family Medicine

## 2024-10-02 ENCOUNTER — Ambulatory Visit (HOSPITAL_COMMUNITY): Payer: Self-pay | Admitting: Family Medicine

## 2024-10-02 VITALS — BP 110/70 | HR 74 | Wt 151.6 lb

## 2024-10-02 DIAGNOSIS — Z87891 Personal history of nicotine dependence: Secondary | ICD-10-CM | POA: Insufficient documentation

## 2024-10-02 DIAGNOSIS — Z95810 Presence of automatic (implantable) cardiac defibrillator: Secondary | ICD-10-CM | POA: Insufficient documentation

## 2024-10-02 DIAGNOSIS — I428 Other cardiomyopathies: Secondary | ICD-10-CM | POA: Insufficient documentation

## 2024-10-02 DIAGNOSIS — I2510 Atherosclerotic heart disease of native coronary artery without angina pectoris: Secondary | ICD-10-CM | POA: Diagnosis not present

## 2024-10-02 DIAGNOSIS — I5022 Chronic systolic (congestive) heart failure: Secondary | ICD-10-CM | POA: Diagnosis not present

## 2024-10-02 DIAGNOSIS — Z79899 Other long term (current) drug therapy: Secondary | ICD-10-CM | POA: Diagnosis not present

## 2024-10-02 DIAGNOSIS — E785 Hyperlipidemia, unspecified: Secondary | ICD-10-CM

## 2024-10-02 DIAGNOSIS — I10 Essential (primary) hypertension: Secondary | ICD-10-CM

## 2024-10-02 DIAGNOSIS — I110 Hypertensive heart disease with heart failure: Secondary | ICD-10-CM | POA: Diagnosis not present

## 2024-10-02 DIAGNOSIS — Z7982 Long term (current) use of aspirin: Secondary | ICD-10-CM | POA: Insufficient documentation

## 2024-10-02 DIAGNOSIS — Z7984 Long term (current) use of oral hypoglycemic drugs: Secondary | ICD-10-CM | POA: Insufficient documentation

## 2024-10-02 LAB — ECHOCARDIOGRAM COMPLETE
Area-P 1/2: 2.83 cm2
S' Lateral: 3 cm

## 2024-10-02 LAB — BASIC METABOLIC PANEL WITH GFR
Anion gap: 9 (ref 5–15)
BUN: 8 mg/dL (ref 8–23)
CO2: 28 mmol/L (ref 22–32)
Calcium: 8.9 mg/dL (ref 8.9–10.3)
Chloride: 102 mmol/L (ref 98–111)
Creatinine, Ser: 0.76 mg/dL (ref 0.61–1.24)
GFR, Estimated: >60 mL/min
Glucose, Bld: 105 mg/dL — ABNORMAL HIGH (ref 70–99)
Potassium: 4.1 mmol/L (ref 3.5–5.1)
Sodium: 138 mmol/L (ref 135–145)

## 2024-10-02 MED ORDER — SACUBITRIL-VALSARTAN 97-103 MG PO TABS: 1.0000 | ORAL_TABLET | Freq: Two times a day (BID) | ORAL | 10 refills | Status: AC

## 2024-10-02 MED ORDER — CARVEDILOL 25 MG PO TABS: 25.0000 mg | ORAL_TABLET | Freq: Two times a day (BID) | ORAL | 0 refills | Status: AC

## 2024-10-02 MED ORDER — ATORVASTATIN CALCIUM 40 MG PO TABS: 40.0000 mg | ORAL_TABLET | Freq: Every day | ORAL | 3 refills | Status: AC

## 2024-10-02 MED ORDER — SPIRONOLACTONE 25 MG PO TABS: 25.0000 mg | ORAL_TABLET | Freq: Every day | ORAL | 3 refills | Status: AC

## 2024-10-02 MED ORDER — ISOSORB DINITRATE-HYDRALAZINE 20-37.5 MG PO TABS: 1.0000 | ORAL_TABLET | Freq: Three times a day (TID) | ORAL | 3 refills | Status: AC

## 2024-10-02 MED ORDER — EMPAGLIFLOZIN 10 MG PO TABS: 10.0000 mg | ORAL_TABLET | Freq: Every day | ORAL | 11 refills | Status: AC

## 2024-10-02 MED ORDER — ASPIRIN 81 MG PO TBEC: 81.0000 mg | DELAYED_RELEASE_TABLET | Freq: Every day | ORAL | 3 refills | Status: AC

## 2024-10-02 NOTE — Telephone Encounter (Addendum)
 Pt aware, agreeable, and verbalized understanding   ----- Message from Harlene CHRISTELLA Gainer sent at 10/02/2024  4:00 PM EST ----- EF 45-50%, G1DD, normal RV. Stable echo, good news!  Please call

## 2024-10-02 NOTE — Patient Instructions (Addendum)
 Good to see you today!  Labs done today, your results will be available in MyChart, we will contact you for abnormal readings.  Your physician recommends that you schedule a follow-up appointment   If you have any questions or concerns before your next appointment please send us  a message through [place] or call our office at [phone].    TO LEAVE A MESSAGE FOR THE NURSE SELECT OPTION 2, PLEASE LEAVE A MESSAGE INCLUDING: YOUR NAME DATE OF BIRTH CALL BACK NUMBER REASON FOR CALL**this is important as we prioritize the call backs  YOU WILL RECEIVE A CALL BACK THE SAME DAY AS LONG AS YOU CALL BEFORE 4:00 PM At the [facility], you and your health needs are our priority. As part of our continuing mission to provide you with exceptional heart care, we have created designated Provider Care Teams. These Care Teams include your primary Cardiologist (physician) and Advanced Practice Providers (APPs- Physician Assistants and Nurse Practitioners) who all work together to provide you with the care you need, when you need it.   You may see any of the following providers on your designated Care Team at your next follow up: Dr Toribio Fuel Dr Ezra Shuck Dr. Morene Brownie Greig Mosses, NP Caffie Shed, [place] [facility] [place], [place] Beckey Coe, NP Jordan Lee, NP Ellouise Class, NP Tinnie Redman, PharmD Jaun Bash, PharmD   Please be sure to bring in all your medications bottles to every appointment.    Thank you for choosing [facility]

## 2024-10-03 ENCOUNTER — Other Ambulatory Visit (HOSPITAL_COMMUNITY): Payer: Self-pay

## 2024-10-03 ENCOUNTER — Telehealth (HOSPITAL_COMMUNITY): Payer: Self-pay

## 2024-10-03 NOTE — Telephone Encounter (Signed)
 Advanced Heart Failure Patient Advocate Encounter  Prior authorization for Jardiance  has been submitted and approved. Test billing returns $338.70 for 30 day supply. This patient is eligible to use copay savings card to reduce cost of this medication, and this plan has an unmet deductible of apx $1000 for 2026.  KeyBETHA BRISK Effective: 09/03/2024 to 10/03/2025  Rachel DEL, CPhT Rx Patient Advocate Phone: [phone]

## 2024-11-13 ENCOUNTER — Inpatient Hospital Stay

## 2024-11-29 ENCOUNTER — Encounter

## 2025-02-28 ENCOUNTER — Encounter

## 2025-05-30 ENCOUNTER — Encounter

## 2025-09-02 ENCOUNTER — Encounter

## 2025-11-28 ENCOUNTER — Encounter
